# Patient Record
Sex: Male | Born: 1960 | Race: Black or African American | Hispanic: No | Marital: Married | Smoking: Never smoker
Health system: Southern US, Community
[De-identification: ages and names within clinical notes are randomized; demographics above are authoritative.]

## PROBLEM LIST (undated history)

## (undated) DIAGNOSIS — E785 Hyperlipidemia, unspecified: Secondary | ICD-10-CM

## (undated) DIAGNOSIS — K5900 Constipation, unspecified: Secondary | ICD-10-CM

## (undated) DIAGNOSIS — R739 Hyperglycemia, unspecified: Secondary | ICD-10-CM

## (undated) DIAGNOSIS — T7840XA Allergy, unspecified, initial encounter: Secondary | ICD-10-CM

## (undated) DIAGNOSIS — I499 Cardiac arrhythmia, unspecified: Secondary | ICD-10-CM

## (undated) DIAGNOSIS — M79604 Pain in right leg: Secondary | ICD-10-CM

## (undated) DIAGNOSIS — Z0000 Encounter for general adult medical examination without abnormal findings: Secondary | ICD-10-CM

## (undated) DIAGNOSIS — I10 Essential (primary) hypertension: Secondary | ICD-10-CM

## (undated) HISTORY — DX: Hyperglycemia, unspecified: R73.9

## (undated) HISTORY — DX: Allergy, unspecified, initial encounter: T78.40XA

## (undated) HISTORY — DX: Pain in right leg: M79.604

## (undated) HISTORY — DX: Constipation, unspecified: K59.00

## (undated) HISTORY — PX: VENTRAL HERNIA REPAIR: SHX424

## (undated) HISTORY — DX: Encounter for general adult medical examination without abnormal findings: Z00.00

---

## 2003-03-31 ENCOUNTER — Encounter (HOSPITAL_BASED_OUTPATIENT_CLINIC_OR_DEPARTMENT_OTHER): Payer: Self-pay | Admitting: General Surgery

## 2003-04-02 ENCOUNTER — Ambulatory Visit (HOSPITAL_COMMUNITY): Admission: RE | Admit: 2003-04-02 | Discharge: 2003-04-02 | Payer: Self-pay | Admitting: General Surgery

## 2003-04-02 ENCOUNTER — Encounter (INDEPENDENT_AMBULATORY_CARE_PROVIDER_SITE_OTHER): Payer: Self-pay | Admitting: Specialist

## 2006-05-05 ENCOUNTER — Emergency Department (HOSPITAL_COMMUNITY): Admission: EM | Admit: 2006-05-05 | Discharge: 2006-05-05 | Payer: Self-pay | Admitting: Emergency Medicine

## 2007-11-27 HISTORY — PX: KNEE ARTHROSCOPY: SHX127

## 2008-12-01 ENCOUNTER — Ambulatory Visit (HOSPITAL_BASED_OUTPATIENT_CLINIC_OR_DEPARTMENT_OTHER): Admission: RE | Admit: 2008-12-01 | Discharge: 2008-12-01 | Payer: Self-pay | Admitting: Orthopedic Surgery

## 2010-05-19 ENCOUNTER — Emergency Department (HOSPITAL_BASED_OUTPATIENT_CLINIC_OR_DEPARTMENT_OTHER): Admission: EM | Admit: 2010-05-19 | Discharge: 2010-05-20 | Payer: Self-pay | Admitting: Emergency Medicine

## 2011-03-12 LAB — POCT I-STAT 4, (NA,K, GLUC, HGB,HCT): Sodium: 142 mEq/L (ref 135–145)

## 2011-04-10 NOTE — Op Note (Signed)
NAME:  Tommy Farrell, Tommy Farrell           ACCOUNT NO.:  [id number]   MEDICAL RECORD NO.:  [id number]          PATIENT TYPE:  AMB   LOCATION:  [facility]                         FACILITY:  [facility]   PHYSICIAN:  Ollen Gross, M.D.    DATE OF BIRTH:  [birth year]   DATE OF PROCEDURE:  12/01/2008  DATE OF DISCHARGE:                               OPERATIVE REPORT   PREOPERATIVE DIAGNOSIS:  Right knee medial meniscal tear.   POSTOPERATIVE DIAGNOSES:  1. Right knee medial meniscal tear.  2. Chondral defect medial.   PROCEDURE:  Right knee arthroscopy with medial meniscal debridement and  chondroplasty.   SURGEON:  Dr. Lequita Halt.   ASSISTANT:  None.   ANESTHESIA:  General.   ESTIMATED BLOOD LOSS:  Minimal.   DRAIN:  None.   COMPLICATIONS:  None.   CONDITION:  Stable to recovery.   BRIEF CLINICAL NOTE:  [name] is a 50 year old male with a significant  history of right knee pain, recurrent effusions and mechanical symptoms.  Exam and history suggest a medial meniscal tear plus/minus chondral  defect.  The MRI confirmed a meniscal tear.  He presents now for  arthroscopy and debridement.   PROCEDURE IN DETAIL:  After the successful administration of general  anesthetic, a tourniquet was placed high on the right thigh and the  right lower extremity prepped and draped in the usual sterile fashion.  Standard superomedial and inferolateral incisions were made.  Inflow  cannula passed superomedial, camera passed inferolateral.  Arthroscopic  visualization proceeds.  The undersurface of the patella and the  trochlea looked normal.  Medial and lateral gutters were visualized,  there were no loose bodies.  Flexion and valgus force was applied to the  knee and the medial compartment was centered.  He had evidence of a  significant tear in the body and posterior horn of the medial meniscus.  There was also an area of cartilage coming off the medial femoral  condyle.  A spinal needle was used to localize  the inferomedial portal,  a small incision made and dilator placed.  I debrided the meniscus back  to a stable base with baskets and a 4.2 mL shaver and sealed it off with  the ArthroCare device.  The meniscus was found to be stable.  On the  surface of the femur there was a tiny area, about 0.5 x 0.5 cm where the  cartilage was coming off the bone.  I debrided this back to a stable  base.  The rest of the medial femoral condyle had some chondromalacia,  but no full-thickness defects.  There was also some chondromalacia on  the tibial plateau, but again no full-thickness defects.  The  intercondylar notch was visualized.  There was no evidence of any ACL  deficiency.  The lateral compartment was entered and it looked normal.  I went back up into the suprapatellar area and there were some loose  bodies.  I removed these with the suction.  There was some hypertrophic  synovitis which I debrided with the ArthroCare device.  The joints were  again inspected.  There was no other abnormalities noted.  The  arthroscopic equipment was then removed from the inferior portals which  were closed with interrupted 4-0 nylon.  Twenty mL of quarter-percent  Marcaine with epinephrine injected through the inflow cannula, then that  was removed and that portal closed with nylon.  A bulky sterile dressing  was applied and he was awakened and transported to recovery in stable  condition.      Ollen Gross, M.D.  Electronically Signed     FA/MEDQ  D:  12/01/2008  T:  12/01/2008  Job:  [id number]

## 2011-04-13 NOTE — Op Note (Signed)
   NAME:  Tommy Farrell, Tommy Farrell NO.:  [id number]   MEDICAL RECORD NO.:  [id number]                   PATIENT TYPE:  OIB   LOCATION:  2899                                 FACILITY:  [facility]   PHYSICIAN:  Leonie Man, M.D.                DATE OF BIRTH:  1961/05/25   DATE OF PROCEDURE:  04/02/2003  DATE OF DISCHARGE:                                 OPERATIVE REPORT   PREOPERATIVE DIAGNOSIS:  Incarcerated ventral hernia.   POSTOPERATIVE DIAGNOSIS:  Incarcerated ventral hernia.   PROCEDURE:  Repair, incarcerated ventral hernia, with mesh.   SURGEON:  Leonie Man, M.D.   ASSISTANT:  Nurse.   ANESTHESIA:  General.   CLINICAL NOTE:  This patient is a 50 year old man presenting with enlarging  mass above the umbilicus, which has been causing some increasing discomfort.  He comes now for repair after the risks and potential benefits of surgery  have been fully discussed, all questions answered, and consents obtained.   DESCRIPTION OF PROCEDURE:  Following the induction of satisfactory general  anesthesia, the patient is positioned supinely, the abdomen prepped and  draped to be included in the sterile operative field.  A midline vertical  incision carried down over the mass, deepened through the signs and  symptoms, dissection carried down into the mass.  The mass is dissected  free, carrying the dissection down to the fascia.  The fascial defect is  then reached and felt.  The intra-abdominal contents within the mass were  primarily omentum and round ligament.  These were dissected and transected  between clamps and secured with ties of 2-0 silk.  The hernial defect  was  repaired with a small mesh plug placed into the defect and sewn in place  with interrupted 0 Novofil sutures.  At the end of this, the repair was  noted to be intact.  Sponge and instrument counts were verified.  The  subcutaneous tissue was closed with a running 2-0 Vicryl suture, skin  closed  with running 4-0 Monocryl suture.  The wound was then reinforced with Steri-  Strips, sterile dressings applied.  Anesthetic reversed, the patient removed  from the operating room to the recovery room in stable condition.  He  tolerated the procedure well.                                               Leonie Man, M.D.    PB/MEDQ  D:  04/02/2003  T:  04/05/2003  Job:  [id number]

## 2011-08-24 LAB — HM COLONOSCOPY

## 2011-11-27 LAB — HM COLONOSCOPY: HM Colonoscopy: 2

## 2011-11-30 ENCOUNTER — Other Ambulatory Visit: Payer: Self-pay | Admitting: Orthopedic Surgery

## 2012-01-10 ENCOUNTER — Encounter (HOSPITAL_BASED_OUTPATIENT_CLINIC_OR_DEPARTMENT_OTHER): Payer: Self-pay | Admitting: *Deleted

## 2012-01-10 NOTE — Progress Notes (Signed)
To [facility] at 0800. Istat,Ekg on arrival.NPO after mn.Will call back  with name of BP medication .

## 2012-01-15 NOTE — H&P (Signed)
  CC- Montoya Brandel is a 51 y.o. male who presents with left knee pain.  HPI- . Knee Pain: Patient presents with knee pain involving the  left knee. Onset of the symptoms was several months ago. Inciting event: injured while at work. Current symptoms include giving out, pain located medially, stiffness and swelling. Pain is aggravated by going up and down stairs, squatting and walking.  Patient has had no prior knee problems. Evaluation to date: MRI: abnormal medial meniscal tear. Treatment to date: OTC analgesics which are not very effective.  Past Medical History  Diagnosis Date  . Hypertension   . Hyperlipemia   . Dysrhythmia     sinus arrythmia-2006-work up negative findings    Past Surgical History  Procedure Date  . Ventral hernia repair   . Knee arthroscopy 2009    rt knee    Prior to Admission medications   Medication Sig Start Date End Date Taking? Authorizing Provider  aspirin 81 MG tablet Take 81 mg by mouth daily.   Yes Historical Provider, MD  fish oil-omega-3 fatty acids 1000 MG capsule Take 2 g by mouth daily.   Yes Historical Provider, MD  Garlic 10 MG CAPS Take by mouth.   Yes Historical Provider, MD  hydrochlorothiazide (HYDRODIURIL) 25 MG tablet Take 25 mg by mouth daily.   Yes Historical Provider, MD  Multiple Vitamin (MULTIVITAMIN) tablet Take 1 tablet by mouth daily.   Yes Historical Provider, MD   KNEE EXAM antalgic gait, soft tissue tenderness over medial joint line, effusion, reduced range of motion, negative drawer sign, collateral ligaments intact, normal ipsilateral hip exam  Physical Examination: General appearance - alert, well appearing, and in no distress Mental status - alert, oriented to person, place, and time Chest - clear to auscultation, no wheezes, rales or rhonchi, symmetric air entry Heart - normal rate, regular rhythm, normal S1, S2, no murmurs, rubs, clicks or gallops Abdomen - soft, nontender, nondistended, no masses or  organomegaly Neurological - alert, oriented, normal speech, no focal findings or movement disorder noted   Asessment/Plan--- Left knee medial meniscal tear- - Plan left knee arthroscopy with meniscal debridement. Procedure risks and potential comps discussed with patient who elects to proceed. Goals are decreased pain and increased function with a high likelihood of achieving both

## 2012-01-16 ENCOUNTER — Encounter (HOSPITAL_BASED_OUTPATIENT_CLINIC_OR_DEPARTMENT_OTHER): Payer: Self-pay | Admitting: *Deleted

## 2012-01-16 ENCOUNTER — Encounter (HOSPITAL_BASED_OUTPATIENT_CLINIC_OR_DEPARTMENT_OTHER)
Admission: RE | Disposition: A | Discharge status: Home / Self Care | Payer: Self-pay | Source: Ambulatory Visit | Attending: Orthopedic Surgery

## 2012-01-16 ENCOUNTER — Encounter (HOSPITAL_BASED_OUTPATIENT_CLINIC_OR_DEPARTMENT_OTHER): Payer: Self-pay | Admitting: Anesthesiology

## 2012-01-16 ENCOUNTER — Ambulatory Visit (HOSPITAL_BASED_OUTPATIENT_CLINIC_OR_DEPARTMENT_OTHER): Payer: BC Managed Care – PPO | Admitting: Anesthesiology

## 2012-01-16 ENCOUNTER — Ambulatory Visit (HOSPITAL_BASED_OUTPATIENT_CLINIC_OR_DEPARTMENT_OTHER)
Admission: RE | Admit: 2012-01-16 | Discharge: 2012-01-16 | Disposition: A | Discharge status: Home / Self Care | Payer: BC Managed Care – PPO | Source: Ambulatory Visit | Attending: Orthopedic Surgery | Admitting: Orthopedic Surgery

## 2012-01-16 ENCOUNTER — Other Ambulatory Visit: Payer: Self-pay

## 2012-01-16 DIAGNOSIS — M25569 Pain in unspecified knee: Secondary | ICD-10-CM | POA: Insufficient documentation

## 2012-01-16 DIAGNOSIS — X58XXXA Exposure to other specified factors, initial encounter: Secondary | ICD-10-CM | POA: Insufficient documentation

## 2012-01-16 DIAGNOSIS — I10 Essential (primary) hypertension: Secondary | ICD-10-CM | POA: Insufficient documentation

## 2012-01-16 DIAGNOSIS — Z79899 Other long term (current) drug therapy: Secondary | ICD-10-CM | POA: Insufficient documentation

## 2012-01-16 DIAGNOSIS — Z7982 Long term (current) use of aspirin: Secondary | ICD-10-CM | POA: Insufficient documentation

## 2012-01-16 DIAGNOSIS — R269 Unspecified abnormalities of gait and mobility: Secondary | ICD-10-CM | POA: Insufficient documentation

## 2012-01-16 DIAGNOSIS — IMO0002 Reserved for concepts with insufficient information to code with codable children: Secondary | ICD-10-CM | POA: Insufficient documentation

## 2012-01-16 DIAGNOSIS — E785 Hyperlipidemia, unspecified: Secondary | ICD-10-CM | POA: Insufficient documentation

## 2012-01-16 DIAGNOSIS — M2240 Chondromalacia patellae, unspecified knee: Secondary | ICD-10-CM | POA: Insufficient documentation

## 2012-01-16 DIAGNOSIS — S83249A Other tear of medial meniscus, current injury, unspecified knee, initial encounter: Secondary | ICD-10-CM

## 2012-01-16 HISTORY — PX: KNEE ARTHROSCOPY: SHX127

## 2012-01-16 HISTORY — DX: Cardiac arrhythmia, unspecified: I49.9

## 2012-01-16 HISTORY — DX: Hyperlipidemia, unspecified: E78.5

## 2012-01-16 HISTORY — DX: Essential (primary) hypertension: I10

## 2012-01-16 SURGERY — ARTHROSCOPY, KNEE
Anesthesia: General | Site: Knee | Laterality: Left | Wound class: Clean

## 2012-01-16 MED ORDER — KETOROLAC TROMETHAMINE 30 MG/ML IJ SOLN
INTRAMUSCULAR | Status: DC | PRN
  Administered 2012-01-16: 30 mg via INTRAVENOUS

## 2012-01-16 MED ORDER — PROMETHAZINE HCL 25 MG/ML IJ SOLN: 6.2500 mg | INTRAMUSCULAR | Status: DC | PRN

## 2012-01-16 MED ORDER — BUPIVACAINE-EPINEPHRINE 0.25% -1:200000 IJ SOLN
INTRAMUSCULAR | Status: DC | PRN
  Administered 2012-01-16: 20 mL

## 2012-01-16 MED ORDER — MIDAZOLAM HCL 5 MG/5ML IJ SOLN
INTRAMUSCULAR | Status: DC | PRN
  Administered 2012-01-16: 2 mg via INTRAVENOUS

## 2012-01-16 MED ORDER — DEXAMETHASONE SODIUM PHOSPHATE 4 MG/ML IJ SOLN
INTRAMUSCULAR | Status: DC | PRN
  Administered 2012-01-16: 8 mg via INTRAVENOUS

## 2012-01-16 MED ORDER — SODIUM CHLORIDE 0.9 % IR SOLN
Status: DC | PRN
  Administered 2012-01-16: 6000

## 2012-01-16 MED ORDER — OXYCODONE-ACETAMINOPHEN 5-325 MG PO TABS
1.0000 | ORAL_TABLET | ORAL | Status: DC | PRN
  Administered 2012-01-16: 1 via ORAL

## 2012-01-16 MED ORDER — ONDANSETRON HCL 4 MG/2ML IJ SOLN
INTRAMUSCULAR | Status: DC | PRN
  Administered 2012-01-16: 4 mg via INTRAVENOUS

## 2012-01-16 MED ORDER — LACTATED RINGERS IV SOLN: INTRAVENOUS | Status: DC

## 2012-01-16 MED ORDER — FENTANYL CITRATE 0.05 MG/ML IJ SOLN
INTRAMUSCULAR | Status: DC | PRN
  Administered 2012-01-16 (×2): 50 ug via INTRAVENOUS

## 2012-01-16 MED ORDER — LIDOCAINE HCL (CARDIAC) 20 MG/ML IV SOLN
INTRAVENOUS | Status: DC | PRN
  Administered 2012-01-16: 100 mg via INTRAVENOUS

## 2012-01-16 MED ORDER — FENTANYL CITRATE 0.05 MG/ML IJ SOLN
25.0000 ug | INTRAMUSCULAR | Status: DC | PRN
  Administered 2012-01-16 (×3): 25 ug via INTRAVENOUS

## 2012-01-16 MED ORDER — LACTATED RINGERS IV SOLN
INTRAVENOUS | Status: DC
  Administered 2012-01-16: 09:00:00 via INTRAVENOUS

## 2012-01-16 MED ORDER — PROPOFOL 10 MG/ML IV EMUL
INTRAVENOUS | Status: DC | PRN
  Administered 2012-01-16: 200 mg via INTRAVENOUS

## 2012-01-16 MED ORDER — METHOCARBAMOL 500 MG PO TABS: 500.0000 mg | ORAL_TABLET | Freq: Four times a day (QID) | ORAL | Status: AC

## 2012-01-16 MED ORDER — CEFAZOLIN SODIUM-DEXTROSE 2-3 GM-% IV SOLR
2.0000 g | Freq: Once | INTRAVENOUS | Status: AC
  Administered 2012-01-16: 2 g via INTRAVENOUS

## 2012-01-16 MED ORDER — OXYCODONE HCL 5 MG PO TABS: 5.0000 mg | ORAL_TABLET | ORAL | Status: AC | PRN

## 2012-01-16 SURGICAL SUPPLY — 35 items
BANDAGE ELASTIC 6 VELCRO ST LF (GAUZE/BANDAGES/DRESSINGS) ×2 IMPLANT
BLADE 4.2CUDA (BLADE) ×2 IMPLANT
BLADE CUDA SHAVER 3.5 (BLADE) IMPLANT
BLADE CUTTER GATOR 3.5 (BLADE) IMPLANT
CANISTER SUCT LVC 12 LTR MEDI- (MISCELLANEOUS) ×2 IMPLANT
CANISTER SUCTION 2500CC (MISCELLANEOUS) IMPLANT
CLOTH BEACON ORANGE TIMEOUT ST (SAFETY) ×2 IMPLANT
DRAPE ARTHROSCOPY W/POUCH 114 (DRAPES) ×2 IMPLANT
DRSG EMULSION OIL 3X3 NADH (GAUZE/BANDAGES/DRESSINGS) ×2 IMPLANT
DURAPREP 26ML APPLICATOR (WOUND CARE) ×2 IMPLANT
ELECT MENISCUS 165MM 90D (ELECTRODE) IMPLANT
ELECT REM PT RETURN 9FT ADLT (ELECTROSURGICAL)
ELECTRODE REM PT RTRN 9FT ADLT (ELECTROSURGICAL) IMPLANT
GLOVE BIO SURGEON STRL SZ 6.5 (GLOVE) ×1 IMPLANT
GLOVE BIO SURGEON STRL SZ8 (GLOVE) ×2 IMPLANT
GLOVE BIOGEL PI IND STRL 7.0 (GLOVE) IMPLANT
GLOVE BIOGEL PI INDICATOR 7.0 (GLOVE) ×1
GLOVE INDICATOR 8.0 STRL GRN (GLOVE) ×2 IMPLANT
GOWN PREVENTION PLUS LG XLONG (DISPOSABLE) ×4 IMPLANT
IV NS IRRIG 3000ML ARTHROMATIC (IV SOLUTION) ×2 IMPLANT
KNEE WRAP E Z 3 GEL PACK (MISCELLANEOUS) ×2 IMPLANT
PACK ARTHROSCOPY DSU (CUSTOM PROCEDURE TRAY) ×2 IMPLANT
PACK BASIN DAY SURGERY FS (CUSTOM PROCEDURE TRAY) ×2 IMPLANT
PADDING CAST ABS 4INX4YD NS (CAST SUPPLIES) ×1
PADDING CAST ABS COTTON 4X4 ST (CAST SUPPLIES) ×1 IMPLANT
PADDING CAST COTTON 6X4 STRL (CAST SUPPLIES) ×2 IMPLANT
PENCIL BUTTON HOLSTER BLD 10FT (ELECTRODE) IMPLANT
SET ARTHROSCOPY TUBING (MISCELLANEOUS) ×2
SET ARTHROSCOPY TUBING LN (MISCELLANEOUS) ×1 IMPLANT
SPONGE GAUZE 4X4 12PLY (GAUZE/BANDAGES/DRESSINGS) ×2 IMPLANT
SUT ETHILON 4 0 PS 2 18 (SUTURE) ×2 IMPLANT
TOWEL OR 17X24 6PK STRL BLUE (TOWEL DISPOSABLE) ×2 IMPLANT
WAND 30 DEG SABER W/CORD (SURGICAL WAND) IMPLANT
WAND 90 DEG TURBOVAC W/CORD (SURGICAL WAND) ×1 IMPLANT
WATER STERILE IRR 500ML POUR (IV SOLUTION) ×2 IMPLANT

## 2012-01-16 NOTE — Discharge Instructions (Addendum)
Arthroscopic Procedure, Knee An arthroscopic procedure can find what is wrong with your knee. PROCEDURE Arthroscopy is a surgical technique that allows your orthopedic surgeon to diagnose and treat your knee injury with accuracy. They will look into your knee through a small instrument. This is almost like a small (pencil sized) telescope. Because arthroscopy affects your knee less than open knee surgery, you can anticipate a more rapid recovery. Taking an active role by following your caregiver's instructions will help with rapid and complete recovery. Use crutches, rest, elevation, ice, and knee exercises as instructed. The length of recovery depends on various factors including type of injury, age, physical condition, medical conditions, and your rehabilitation. Your knee is the joint between the large bones (femur and tibia) in your leg. Cartilage covers these bone ends which are smooth and slippery and allow your knee to bend and move smoothly. Two menisci, thick, semi-lunar shaped pads of cartilage which form a rim inside the joint, help absorb shock and stabilize your knee. Ligaments bind the bones together and support your knee joint. Muscles move the joint, help support your knee, and take stress off the joint itself. Because of this all programs and physical therapy to rehabilitate an injured or repaired knee require rebuilding and strengthening your muscles. AFTER THE PROCEDURE  After the procedure, you will be moved to a recovery area until most of the effects of the medication have worn off. Your caregiver will discuss the test results with you.   Only take over-the-counter or prescription medicines for pain, discomfort, or fever as directed by your caregiver.  SEEK MEDICAL CARE IF:   You have increased bleeding from your wounds.   You see redness, swelling, or have increasing pain in your wounds.   You have pus coming from your wound.   You have an oral temperature above 102 F (38.9  C).   You notice a bad smell coming from the wound or dressing.   You have severe pain with any motion of your knee.  SEEK IMMEDIATE MEDICAL CARE IF:   You develop a rash.   You have difficulty breathing.   You have any allergic problems.  Document Released: 11/09/2000 Document Revised: 07/25/2011 Document Reviewed: 06/02/2008 [facility] Patient Information 2012 [place], [place].Arthroscopic Procedure, Knee An arthroscopic procedure can find what is wrong with your knee. PROCEDURE Arthroscopy is a surgical technique that allows your orthopedic surgeon to diagnose and treat your knee injury with accuracy. They will look into your knee through a small instrument. This is almost like a small (pencil sized) telescope. Because arthroscopy affects your knee less than open knee surgery, you can anticipate a more rapid recovery. Taking an active role by following your caregiver's instructions will help with rapid and complete recovery. Use crutches, rest, elevation, ice, and knee exercises as instructed. The length of recovery depends on various factors including type of injury, age, physical condition, medical conditions, and your rehabilitation. Your knee is the joint between the large bones (femur and tibia) in your leg. Cartilage covers these bone ends which are smooth and slippery and allow your knee to bend and move smoothly. Two menisci, thick, semi-lunar shaped pads of cartilage which form a rim inside the joint, help absorb shock and stabilize your knee. Ligaments bind the bones together and support your knee joint. Muscles move the joint, help support your knee, and take stress off the joint itself. Because of this all programs and physical therapy to rehabilitate an injured or repaired knee require rebuilding and  strengthening your muscles. AFTER THE PROCEDURE  After the procedure, you will be moved to a recovery area until most of the effects of the medication have worn off. Your caregiver will  discuss the test results with you.   Only take over-the-counter or prescription medicines for pain, discomfort, or fever as directed by your caregiver.  SEEK MEDICAL CARE IF:   You have increased bleeding from your wounds.   You see redness, swelling, or have increasing pain in your wounds.   You have pus coming from your wound.   You have an oral temperature above 102 F (38.9 C).   You notice a bad smell coming from the wound or dressing.   You have severe pain with any motion of your knee.  SEEK IMMEDIATE MEDICAL CARE IF:   You develop a rash.   You have difficulty breathing.   You have any allergic problems.  Document Released: 11/09/2000 Document Revised: 07/25/2011 Document Reviewed: 06/02/2008 [facility] Patient Information 2012 [place], [place].

## 2012-01-16 NOTE — Anesthesia Procedure Notes (Signed)
Procedure Name: LMA Insertion Performed by: Myking Sar Pre-anesthesia Checklist: Patient identified, Emergency Drugs available, Suction available and Patient being monitored Patient Re-evaluated:Patient Re-evaluated prior to inductionOxygen Delivery Method: Circle System Utilized Preoxygenation: Pre-oxygenation with 100% oxygen Intubation Type: IV induction Ventilation: Mask ventilation without difficulty LMA: LMA inserted LMA Size: 4.0 Number of attempts: 1 Airway Equipment and Method: bite block Placement Confirmation: positive ETCO2 Dental Injury: Teeth and Oropharynx as per pre-operative assessment      

## 2012-01-16 NOTE — Progress Notes (Signed)
Scripts sent to wife by D. Marvetta Gibbons, RN

## 2012-01-16 NOTE — Interval H&P Note (Signed)
History and Physical Interval Note:  01/16/2012 10:07 AM  Tommy Farrell  has presented today for surgery, with the diagnosis of LEFT KNEE MEDIAL MENISCAL TEAR  The various methods of treatment have been discussed with the patient and family. After consideration of risks, benefits and other options for treatment, the patient has consented to  Procedure(s) (LRB): ARTHROSCOPY KNEE (Left) as a surgical intervention .  The patients' history has been reviewed, patient examined, no change in status, stable for surgery.  I have reviewed the patients' chart and labs.  Questions were answered to the patient's satisfaction.     Loanne Drilling

## 2012-01-16 NOTE — Anesthesia Preprocedure Evaluation (Addendum)
Anesthesia Evaluation  Patient identified by MRN, date of birth, ID band Patient awake    Reviewed: Allergy & Precautions, H&P , NPO status , Patient's Chart, lab work & pertinent test results  Airway Mallampati: II TM Distance: >3 FB Neck ROM: full    Dental No notable dental hx. (+) Teeth Intact and Dental Advisory Given   Pulmonary neg pulmonary ROS,  clear to auscultation  Pulmonary exam normal       Cardiovascular Exercise Tolerance: Good hypertension, On Medications neg cardio ROS + dysrhythmias regular Normal    Neuro/Psych Negative Neurological ROS  Negative Psych ROS   GI/Hepatic negative GI ROS, Neg liver ROS,   Endo/Other  Negative Endocrine ROS  Renal/GU negative Renal ROS  Genitourinary negative   Musculoskeletal   Abdominal   Peds  Hematology negative hematology ROS (+)   Anesthesia Other Findings   Reproductive/Obstetrics negative OB ROS                          Anesthesia Physical Anesthesia Plan  ASA: II  Anesthesia Plan: General   Post-op Pain Management:    Induction: Intravenous  Airway Management Planned: LMA  Additional Equipment:   Intra-op Plan:   Post-operative Plan:   Informed Consent: I have reviewed the patients History and Physical, chart, labs and discussed the procedure including the risks, benefits and alternatives for the proposed anesthesia with the patient or authorized representative who has indicated his/her understanding and acceptance.   Dental Advisory Given  Plan Discussed with: CRNA and Surgeon  Anesthesia Plan Comments:         Anesthesia Quick Evaluation

## 2012-01-16 NOTE — Transfer of Care (Signed)
Immediate Anesthesia Transfer of Care Note  Patient: Tommy Farrell  Procedure(s) Performed: Procedure(s) (LRB): ARTHROSCOPY KNEE (Left)  Patient Location: PACU  Anesthesia Type: General  Level of Consciousness: awake  Airway & Oxygen Therapy: Patient Spontanous Breathing and Patient connected to nasal cannula oxygen  Post-op Assessment: Report given to PACU RN  Post vital signs: Reviewed and stable  Complications: No apparent anesthesia complications

## 2012-01-16 NOTE — Anesthesia Postprocedure Evaluation (Signed)
  Anesthesia Post-op Note  Patient: Tommy Farrell  Procedure(s) Performed: Procedure(s) (LRB): ARTHROSCOPY KNEE (Left)  Patient Location: PACU  Anesthesia Type: General  Level of Consciousness: awake and alert   Airway and Oxygen Therapy: Patient Spontanous Breathing  Post-op Pain: mild  Post-op Assessment: Post-op Vital signs reviewed, Patient's Cardiovascular Status Stable, Respiratory Function Stable, Patent Airway and No signs of Nausea or vomiting  Post-op Vital Signs: stable  Complications: No apparent anesthesia complications

## 2012-01-16 NOTE — Op Note (Signed)
Preoperative diagnosis-  Left knee medial meniscal tear  Postoperative diagnosis Left- knee medial meniscal tear   Plus Left medial femoral chondral defect  Procedure- Left knee arthroscopy with medial  Meniscal debridement and chondroplasty   Surgeon- Gus Rankin. Evin Loiseau, MD  Anesthesia-General  EBL-  Minimal  Complications- None  Condition- PACU - hemodynamically stable.  Brief clinical note- -Tommy Farrell is a 51 y.o.  male with a several month history of left knee pain and mechanical symptoms. Exam and history suggested medial meniscal tear confirmed by MRI. The patient presents now for arthroscopy and debridement  Procedure in detail -       After successful administration of General anesthetic, a tourmiquet is placed high on the Left  thigh and the Left lower extremity is prepped and draped in the usual sterile fashion. Time out is performed by the surgical team. Standard superomedial and inferolateral portal sites are marked and incisions made with an 11 blade. The inflow cannula is passed through the superomedial portal and camera through the inferolateral portal and inflow is initiated. Arthroscopic visualization proceeds.      The undersurface of the patella and trochlea are visualized and appear normal. The medial and lateral gutters are visualized and there are  no loose bodies. Flexion and valgus force is applied to the knee and the medial compartment is entered. A spinal needle is passed into the joint through the site marked for the inferomedial portal. A small incision is made and the dilator passed into the joint. The findings for the medial compartment are tear of body and posterior horn of the medial meniscus with a fragment flipped into the intercondylar notch as well as a small chondral defect on the medial femoral condyle . The tear is debrided to a stable base with baskets and a shaver and sealed off with the Arthrocare. The shaver is used to debride the unstable  cartilage to a stable cartilaginous base with stable edges. It is probed and found to be stable.    The intercondylar notch is visualized and the ACL appears normal . The lateral compartment is entered and the findings are normal .      The joint is again inspected and there are no other tears, defects or loose bodies identified. The arthroscopic equipment is then removed from the inferior portals which are closed with interrupted 4-0 nylon. 20 ml of .25% Marcaine with epinephrine are injected through the inflow cannula and the cannula is then removed and the portal closed with nylon. The incisions are cleaned and dried and a bulky sterile dressing is applied. The patient is then awakened and transported to recovery in stable condition.   01/16/2012, 10:45 AM

## 2012-01-17 ENCOUNTER — Encounter (HOSPITAL_BASED_OUTPATIENT_CLINIC_OR_DEPARTMENT_OTHER): Payer: Self-pay | Admitting: Orthopedic Surgery

## 2012-01-22 ENCOUNTER — Encounter (HOSPITAL_BASED_OUTPATIENT_CLINIC_OR_DEPARTMENT_OTHER): Payer: Self-pay

## 2013-11-16 DIAGNOSIS — R7303 Prediabetes: Secondary | ICD-10-CM | POA: Insufficient documentation

## 2014-11-07 ENCOUNTER — Emergency Department (HOSPITAL_BASED_OUTPATIENT_CLINIC_OR_DEPARTMENT_OTHER)
Admission: EM | Admit: 2014-11-07 | Discharge: 2014-11-07 | Disposition: A | Discharge status: Home / Self Care | Payer: BC Managed Care – PPO | Attending: Emergency Medicine | Admitting: Emergency Medicine

## 2014-11-07 ENCOUNTER — Encounter (HOSPITAL_BASED_OUTPATIENT_CLINIC_OR_DEPARTMENT_OTHER): Payer: Self-pay

## 2014-11-07 DIAGNOSIS — S86911A Strain of unspecified muscle(s) and tendon(s) at lower leg level, right leg, initial encounter: Secondary | ICD-10-CM | POA: Diagnosis not present

## 2014-11-07 DIAGNOSIS — Y9289 Other specified places as the place of occurrence of the external cause: Secondary | ICD-10-CM | POA: Diagnosis not present

## 2014-11-07 DIAGNOSIS — Z7982 Long term (current) use of aspirin: Secondary | ICD-10-CM | POA: Diagnosis not present

## 2014-11-07 DIAGNOSIS — Y990 Civilian activity done for income or pay: Secondary | ICD-10-CM | POA: Diagnosis not present

## 2014-11-07 DIAGNOSIS — T148XXA Other injury of unspecified body region, initial encounter: Secondary | ICD-10-CM

## 2014-11-07 DIAGNOSIS — S7011XA Contusion of right thigh, initial encounter: Secondary | ICD-10-CM | POA: Diagnosis not present

## 2014-11-07 DIAGNOSIS — Y9389 Activity, other specified: Secondary | ICD-10-CM | POA: Diagnosis not present

## 2014-11-07 DIAGNOSIS — X58XXXA Exposure to other specified factors, initial encounter: Secondary | ICD-10-CM | POA: Diagnosis not present

## 2014-11-07 DIAGNOSIS — I10 Essential (primary) hypertension: Secondary | ICD-10-CM | POA: Insufficient documentation

## 2014-11-07 DIAGNOSIS — Z8639 Personal history of other endocrine, nutritional and metabolic disease: Secondary | ICD-10-CM | POA: Insufficient documentation

## 2014-11-07 DIAGNOSIS — Z79899 Other long term (current) drug therapy: Secondary | ICD-10-CM | POA: Insufficient documentation

## 2014-11-07 DIAGNOSIS — S8991XA Unspecified injury of right lower leg, initial encounter: Secondary | ICD-10-CM | POA: Diagnosis present

## 2014-11-07 MED ORDER — HYDROCODONE-ACETAMINOPHEN 5-325 MG PO TABS: 1.0000 | ORAL_TABLET | ORAL | Status: DC | PRN

## 2014-11-07 MED ORDER — METHOCARBAMOL 500 MG PO TABS: 500.0000 mg | ORAL_TABLET | Freq: Two times a day (BID) | ORAL | Status: DC

## 2014-11-07 MED ORDER — NAPROXEN 500 MG PO TABS: 500.0000 mg | ORAL_TABLET | Freq: Two times a day (BID) | ORAL | Status: DC

## 2014-11-07 NOTE — ED Provider Notes (Signed)
CSN: [id number]     Arrival date & time 11/07/14  0905 History   First MD Initiated Contact with Patient 11/07/14 0919     Chief Complaint  Patient presents with  . Leg Pain      HPI  Patient presents with right leg muscle pain and skin discoloration. He was at work on Wednesday, 4 days ago. He was standing in the back of a truck. He states his right foot was planted he was leaning to his left pushing a box. He felt a pain and pole and burning in his "hamstring". He states he was sore and felt like it was tight" like muscle pull". However yesterday morning he noticed there is some discoloration of the skin and his medial distal thigh. He became concerned. More discoloration. This morning he presents here. He walks with a minimal limp and states his pain is improving.  Past Medical History  Diagnosis Date  . Hypertension   . Hyperlipemia   . Dysrhythmia     sinus arrythmia-2006-work up negative findings   Past Surgical History  Procedure Laterality Date  . Ventral hernia repair    . Knee arthroscopy  2009    rt knee  . Knee arthroscopy  01/16/2012    Procedure: ARTHROSCOPY KNEE;  Surgeon: Gearlean Alf, MD;  Location: [facility];  Service: Orthopedics;  Laterality: Left;  WITH DEBRIDEMENT   No family history on file. History  Substance Use Topics  . Smoking status: Never Smoker   . Smokeless tobacco: Not on file  . Alcohol Use: No    Review of Systems  Musculoskeletal: Positive for myalgias and gait problem.  Skin: Positive for color change.  Hematological: Bruises/bleeds easily.      Allergies  Review of patient's allergies indicates no known allergies.  Home Medications   Prior to Admission medications   Medication Sig Start Date End Date Taking? Authorizing Provider  aspirin 81 MG tablet Take 81 mg by mouth daily.    Historical Provider, MD  fish oil-omega-3 fatty acids 1000 MG capsule Take 2 g by mouth daily.    Historical Provider, MD  Garlic  10 MG CAPS Take by mouth.    Historical Provider, MD  hydrochlorothiazide (HYDRODIURIL) 25 MG tablet Take 25 mg by mouth daily.    Historical Provider, MD  HYDROcodone-acetaminophen (NORCO/VICODIN) 5-325 MG per tablet Take 1 tablet by mouth every 4 (four) hours as needed. 11/07/14   Tanna Furry, MD  methocarbamol (ROBAXIN) 500 MG tablet Take 1 tablet (500 mg total) by mouth 2 (two) times daily. 11/07/14   Tanna Furry, MD  Multiple Vitamin (MULTIVITAMIN) tablet Take 1 tablet by mouth daily.    Historical Provider, MD  naproxen (NAPROSYN) 500 MG tablet Take 1 tablet (500 mg total) by mouth 2 (two) times daily. 11/07/14   Tanna Furry, MD   BP 149/95 mmHg  Pulse 68  Temp(Src) 98.4 F (36.9 C)  Resp 18  Ht 5\' 6"  (1.676 m)  Wt 165 lb (74.844 kg)  BMI 26.64 kg/m2  SpO2 98% Physical Exam  Musculoskeletal:       Legs: He has no pain to groin and adduction. He is summer he appears stable pain to hip extension and knee flexion. All referring to the area of discoloration consistent with mid to distal hamstring strain and partial tear.    ED Course  Procedures (including critical care time) Labs Review Labs Reviewed - No data to display  Imaging Review No results found.  EKG Interpretation None      MDM   Final diagnoses:  Muscle strain of right lower extremity, initial encounter  Hematoma    Exam and history and findings consistent with a medial hamstring strain and partial tear. His hamstring mechanism is intact he is able to fully flex extend the hip and leg. Hematoma on exam. No sign of compartment syndrome. Not tense to the muscle. Plan is expectant management. Warm soaks and gentle massage. Pain medication and anti-inflammatory muscle relaxant. Activity as tolerated.    Tanna Furry, MD 11/07/14 1003

## 2014-11-07 NOTE — ED Notes (Signed)
Patient here with right upper leg pain and discoloration. Reports Wednesday night lifted something off truck at work and felt a pull, now discoloration to same.

## 2014-11-07 NOTE — Discharge Instructions (Signed)
The discoloration is due to blood under the skin from the muscle tear.  This will resolve on its own slowly. Warm soaks and gentle massage can help the blood away more quickly. Gentle stretching as your pain improves.   Muscle Strain A muscle strain (pulled muscle) happens when a muscle is stretched beyond normal length. It happens when a sudden, violent force stretches your muscle too far. Usually, a few of the fibers in your muscle are torn. Muscle strain is common in athletes. Recovery usually takes 1-2 weeks. Complete healing takes 5-6 weeks.  HOME CARE   Follow the PRICE method of treatment to help your injury get better. Do this the first 2-3 days after the injury:  Protect. Protect the muscle to keep it from getting injured again.  Rest. Limit your activity and rest the injured body part.  Ice. Put ice in a plastic bag. Place a towel between your skin and the bag. Then, apply the ice and leave it on from 15-20 minutes each hour. After the third day, switch to moist heat packs.  Compression. Use a splint or elastic bandage on the injured area for comfort. Do not put it on too tightly.  Elevate. Keep the injured body part above the level of your heart.  Only take medicine as told by your doctor.  Warm up before doing exercise to prevent future muscle strains. GET HELP IF:   You have more pain or puffiness (swelling) in the injured area.  You feel numbness, tingling, or notice a loss of strength in the injured area. MAKE SURE YOU:   Understand these instructions.  Will watch your condition.  Will get help right away if you are not doing well or get worse. Document Released: 08/21/2008 Document Revised: 09/02/2013 Document Reviewed: 06/11/2013 [facility] Patient Information 2015 [place], [place]. This information is not intended to replace advice given to you by your health care provider. Make sure you discuss any questions you have with your health care provider.  Muscle  Tear A muscle tear is usually caused by over-exertion or stretching. The muscle often takes a while to heal. Muscle tears require 3 to 4 weeks to heal completely. A history of the injury and a physical exam may be performed. Sometimes, the injury is identified with radiographs and an MRI. Treatment for muscle injuries includes:  Resting and protecting the affected area until pain with motion is gone.  Putting ice on the injured area.  Put ice in a bag.  Place a towel between your skin and the bag.  Leave the ice on for 15 to 20 minutes, 3 to 4 times a day.  After two days, you can use heat to relieve spasms.  Using compression wraps to help control swelling and limit movement.  Raising (elevate) the injured area above the level of the heart (if possible) for the first 1 to 2 days after the injury.  Medicine may be prescribed to reduce pain and inflammation. Avoid strenuous activities that bring on muscle pain. Exercises to strengthen and stretch the injured muscle can help heal the muscle and prevent repeated injury. After the pain and swelling are gone, you may begin gradual strengthening exercises. Begin range-of-motion exercises and gentle stretching after 3 to 4 days of rest.  SEEK MEDICAL CARE IF:  Your injured muscle is not improving after 1 week of treatment. Document Released: 12/20/2004 Document Revised: 02/04/2012 Document Reviewed: 05/27/2009 [facility] Patient Information 2015 [place], [place]. This information is not intended to replace advice given  to you by your health care provider. Make sure you discuss any questions you have with your health care provider.

## 2014-11-08 ENCOUNTER — Emergency Department (HOSPITAL_COMMUNITY)
Admission: EM | Admit: 2014-11-08 | Discharge: 2014-11-08 | Disposition: A | Discharge status: Home / Self Care | Payer: BC Managed Care – PPO | Attending: Emergency Medicine | Admitting: Emergency Medicine

## 2014-11-08 DIAGNOSIS — Z8639 Personal history of other endocrine, nutritional and metabolic disease: Secondary | ICD-10-CM | POA: Diagnosis not present

## 2014-11-08 DIAGNOSIS — X58XXXD Exposure to other specified factors, subsequent encounter: Secondary | ICD-10-CM | POA: Diagnosis not present

## 2014-11-08 DIAGNOSIS — S76011D Strain of muscle, fascia and tendon of right hip, subsequent encounter: Secondary | ICD-10-CM | POA: Diagnosis not present

## 2014-11-08 DIAGNOSIS — Z7982 Long term (current) use of aspirin: Secondary | ICD-10-CM | POA: Insufficient documentation

## 2014-11-08 DIAGNOSIS — I10 Essential (primary) hypertension: Secondary | ICD-10-CM | POA: Diagnosis not present

## 2014-11-08 DIAGNOSIS — S76301S Unspecified injury of muscle, fascia and tendon of the posterior muscle group at thigh level, right thigh, sequela: Secondary | ICD-10-CM

## 2014-11-08 DIAGNOSIS — S79821S Other specified injuries of right thigh, sequela: Secondary | ICD-10-CM | POA: Insufficient documentation

## 2014-11-08 DIAGNOSIS — M79604 Pain in right leg: Secondary | ICD-10-CM | POA: Insufficient documentation

## 2014-11-08 DIAGNOSIS — Z79899 Other long term (current) drug therapy: Secondary | ICD-10-CM | POA: Diagnosis not present

## 2014-11-08 NOTE — ED Provider Notes (Signed)
CSN: [id number]     Arrival date & time 11/08/14  0818 History   First MD Initiated Contact with Patient 11/08/14 [phone]     No chief complaint on file.    (Consider location/radiation/quality/duration/timing/severity/associated sxs/prior Treatment) Patient is a 53 y.o. male presenting with leg pain.  Leg Pain Location:  Leg Time since incident:  5 days Injury: yes   Mechanism of injury comment:  Twisting Leg location:  R upper leg Pain details:    Quality:  Aching   Radiates to:  Does not radiate   Severity:  Mild   Onset quality:  Sudden Chronicity:  New Foreign body present:  No foreign bodies Relieved by:  Nothing Worsened by:  Flexion and adduction Ineffective treatments:  None tried Associated symptoms: no back pain, no fever, no muscle weakness, no neck pain, no numbness, no stiffness and no swelling     Past Medical History  Diagnosis Date  . Hypertension   . Hyperlipemia   . Dysrhythmia     sinus arrythmia-2006-work up negative findings   Past Surgical History  Procedure Laterality Date  . Ventral hernia repair    . Knee arthroscopy  2009    rt knee  . Knee arthroscopy  01/16/2012    Procedure: ARTHROSCOPY KNEE;  Surgeon: Gearlean Alf, MD;  Location: [facility];  Service: Orthopedics;  Laterality: Left;  WITH DEBRIDEMENT   No family history on file. History  Substance Use Topics  . Smoking status: Never Smoker   . Smokeless tobacco: Not on file  . Alcohol Use: No    Review of Systems  Constitutional: Negative for fever.  Musculoskeletal: Negative for back pain, stiffness and neck pain.  All other systems reviewed and are negative.     Allergies  Review of patient's allergies indicates no known allergies.  Home Medications   Prior to Admission medications   Medication Sig Start Date End Date Taking? Authorizing Provider  aspirin 81 MG tablet Take 81 mg by mouth daily.    Historical Provider, MD  fish oil-omega-3 fatty acids  1000 MG capsule Take 2 g by mouth daily.    Historical Provider, MD  Garlic 10 MG CAPS Take by mouth.    Historical Provider, MD  hydrochlorothiazide (HYDRODIURIL) 25 MG tablet Take 25 mg by mouth daily.    Historical Provider, MD  HYDROcodone-acetaminophen (NORCO/VICODIN) 5-325 MG per tablet Take 1 tablet by mouth every 4 (four) hours as needed. 11/07/14   Tanna Furry, MD  methocarbamol (ROBAXIN) 500 MG tablet Take 1 tablet (500 mg total) by mouth 2 (two) times daily. 11/07/14   Tanna Furry, MD  Multiple Vitamin (MULTIVITAMIN) tablet Take 1 tablet by mouth daily.    Historical Provider, MD  naproxen (NAPROSYN) 500 MG tablet Take 1 tablet (500 mg total) by mouth 2 (two) times daily. 11/07/14   Tanna Furry, MD   BP 146/88 mmHg  Pulse 66  Temp(Src) 97.9 F (36.6 C) (Oral)  Resp 16  SpO2 97% Physical Exam  Constitutional: He is oriented to person, place, and time. He appears well-developed and well-nourished.  HENT:  Head: Normocephalic and atraumatic.  Eyes: Conjunctivae and EOM are normal.  Neck: Normal range of motion. Neck supple.  Cardiovascular: Normal rate, regular rhythm and normal heart sounds.   Pulmonary/Chest: Effort normal and breath sounds normal. No respiratory distress.  Abdominal: He exhibits no distension. There is no tenderness. There is no rebound and no guarding.  Musculoskeletal: Normal range of motion.  Bruising  over R adductor muscle sheath.  2+ Pulses, no calf swelling or tenderness  Neurological: He is alert and oriented to person, place, and time.  Skin: Skin is warm and dry.  Vitals reviewed.   ED Course  Procedures (including critical care time) Labs Review Labs Reviewed - No data to display  Imaging Review No results found.   EKG Interpretation None      MDM   Final diagnoses:  None    53 y.o. male with pertinent PMH of HTN presents with stated concern of blood clot in right thigh. The patient was seen yesterday for similar symptoms, diagnosed  with partial tear of muscle. He presents today due to persistent concern of clot, no new symptoms. 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 His exam and history are consistent with partial tear of abductor group of right thigh. He has bruising overlying this area. symptoms. Doubt DVT and patient was given strict return precautions for DVT symptoms. He seemed relieved. Discharged home in stable condition with orthopedic and PCP follow-up.    1. Pain of right lower extremity   2. Hamstring injury, right, sequela   3. Strain of right hip adductor muscle, subsequent encounter         Debby Freiberg, MD 11/08/14 [phone]

## 2014-11-08 NOTE — ED Notes (Signed)
MD at bedside. 

## 2014-11-08 NOTE — Discharge Instructions (Signed)
Adductor Muscle Strain with Rehab  The adductor muscles of the thigh are responsible for moving the leg across the body and are susceptible to muscle strains. A strain is an injury to a muscle or a tendon that attaches the muscle to a bone. Strains of the adductor muscles occur where the muscle tendons attach to the pelvic bone. A muscle strain may be a complete or partial tear of the muscle and may involve one or more of the adductor muscles. These strains are usually classified as a grade 1 or 2 strain. A grade 1 strain has no obvious sign of tearing or stretching of the muscle or tendon, but may include significant inflammation. A grade 2 strain is a moderate strain in which the muscle or tendon has been partially torn and has been stretched. Grade 2 strains are usually accompanied with loss of strength. A grade 3 muscle strain rarely occurs in the adductor muscles. A grade 3 strain is a complete tear of the muscle or tendon. SYMPTOMS   Occasionally there is a sudden "pop" felt or heard in the groin or inner thigh at the time of injury.  There may be pain, tenderness, swelling, warmth, or redness over the inner thigh and groin. This may be worsened by moving the hip (especially when spreading the legs or hips, pushing the legs against each other or kicking with the affected leg). There may be bruising (contusion) in the groin and inner thigh within 48 hours following the injury.  There may be loss of fullness of the muscle with complete rupture (uncommon).  Muscle spasm in the groin and inner thigh can occur. CAUSES   Prolonged overuse or a sudden increase in intensity, frequency, or duration of activity.  Single episode of stressful overactivity, such as during kicking.  Single violent blow or force to the inner thigh (less common). RISK INCREASES WITH:  Sports that require repeated kicking (soccer, martial arts, football), as well as sports that require the legs to be brought together  (gymnastics, horseback riding).  Sports that require rapid acceleration (ice hockey, track and field).  Poor strength and flexibility.  Previous thigh injury. PREVENTION   Warm up and stretch properly before activity.  Maintain physical fitness:  Hip and thigh flexibility.  Muscle strength and endurance.  Cardiovascular fitness.  Complete the entire course of rehabilitation after any lower extremity injury. Do this before returning to competition or practice. Follow suggestions of your caregiver. PROGNOSIS  If treated properly, adductor muscle strains usually heal well within 2 to 6 weeks. RELATED COMPLICATIONS   Healing time will be prolonged if the condition is not appropriately treated. It needs adequate time to heal.  Do not return to activity too soon. Recurrence of symptoms and reinjury are possible.  If left untreated, the strain may progress to a complete tear (rare) or other injury caused by limping and favoring the injured leg.  Prolonged disability is possible. TREATMENT Treatment initially involves ice and medication to help reduce pain and inflammation. Strength and stretching exercises are recommended to maintain strength and a full range of motion. Strenuous activities should be modified to prevent further injury. Using crutches for the first few days may help to lessen pain. On rare occasions, surgery is necessary to reattach the tendon to the bone. If pain becomes persistent or chronic after more than 3 months of nonsurgical treatment, surgery may also be recommended. MEDICATION   If pain medication is necessary, nonsteroidal anti-inflammatory medications, such as aspirin and ibuprofen, or  other minor pain relievers, such as acetaminophen, are often recommended.  Do not take pain medication for 7 days before surgery or as advised.  Prescription pain relievers may be given. Use only as directed and only as much as you need.  Ointments applied to the skin may  be helpful.  Corticosteroid injections may be given to reduce inflammation. HEAT AND COLD  Cold treatment (icing) relieves pain and reduces inflammation. Cold treatment should be applied for 10 to 15 minutes every 2 to 3 hours for inflammation and pain and immediately after any activity that aggravates your symptoms. Use ice packs or an ice massage.  Heat treatment may be used prior to performing the stretching and strengthening activities prescribed by your caregiver, physical therapist, or athletic trainer. Use a heat pack or a warm soak. SEEK MEDICAL CARE IF:   Symptoms get worse or do not improve in 2 weeks, despite treatment.  New, unexplained symptoms develop. (Drugs used in treatment may produce side effects.)  Emergency Department Resource Guide 1) Find a Doctor and Pay Out of Pocket Although you won't have to find out who is covered by your insurance plan, it is a good idea to ask around and get recommendations. You will then need to call the office and see if the doctor you have chosen will accept you as a new patient and what types of options they offer for patients who are self-pay. Some doctors offer discounts or will set up payment plans for their patients who do not have insurance, but you will need to ask so you aren't surprised when you get to your appointment.  2) Contact Your Local Health Department Not all health departments have doctors that can see patients for sick visits, but many do, so it is worth a call to see if yours does. If you don't know where your local health department is, you can check in your phone book. The CDC also has a tool to help you locate your state's health department, and many state websites also have listings of all of their local health departments.  3) Find a [facility] If your illness is not likely to be very severe or complicated, you may want to try a walk in clinic. These are popping up all over the country in pharmacies, drugstores, and  shopping centers. They're usually staffed by nurse practitioners or physician assistants that have been trained to treat common illnesses and complaints. They're usually fairly quick and inexpensive. However, if you have serious medical issues or chronic medical problems, these are probably not your best option.  No Primary Care Doctor: - Call Health Connect at  [phone] - they can help you locate a primary care doctor that  accepts your insurance, provides certain services, etc. - Physician Referral Service- [phone]  Chronic Pain Problems: Organization         Address  Phone   Notes  [facility]  [phone] Patients need to be referred by their primary care doctor.   Medication Assistance: Organization         Address  Phone   Notes  [facility] Medication [facility] [address] [place], [place] [address] [phone] --Must be a resident of [facility] -- Must have NO insurance coverage whatsoever (no Medicaid/ Medicare, etc.) -- The pt. MUST have a primary care doctor that directs their care regularly and follows them in the community   [facility]ist  [phone]   [address]  (  [phone]    Agencies that provide inexpensive medical care: Organization         Address  Phone   Notes  [place]  [phone]   [facility]    [phone]   [facility] [place], [place] [address] [phone]   [place] [address], [place] [phone]   Planned Parenthood    [phone]   [facility]    [phone]   [place] and [place] [address], [place] Phone:  [phone], Fax:  [phone] Hours of Operation:  9 am - 6 pm, M-F.  Also accepts Medicaid/Medicare and self-pay.  [facility] for [place], [address], [place] Phone: [phone],  Fax: [phone]. Hours of Operation:  8:30 am - 5:30 pm, M-F.  Also accepts Medicaid and self-pay.  [facility] [place] [address] [place] Phone: [phone]   [place], [place], [place] [phone], Ext. [phone] Mondays & Thursdays: 7-9 AM.  First 15 patients are seen on a first come, first serve basis.    [place] Providers:  Organization         Address  Phone   Notes  [facility] [address], [place] [phone] Also accepts self-pay patients.  [facility] [address], [place]  [phone]   [place], [address], [place] [phone]   [facility] [address] [place] [phone]   Lucianne Lei [address], [place]   [phone] Only accepts [place] Access [place] patients after they have their name applied to their card.   Self-Pay (no insurance) in [facility]:  Organization         Address  Phone   Notes  Sickle Cell Patients, [facility] [place] [phone]   [facility] [place] [phone]   [facility] [place]  [place], [place], [place] [phone]   [facility]/Dr. Osei-Bonsu  [address], [place] or [address], [place] [phone] Phone number for both [place] and [place] locations is the same.  Urgent Medical and [facility] [address] [place] [phone]   [facility] [address] [place] or [address] [phone]   [facility] [address], [place] [phone], phone; [phone], fax Sees patients 1st and 3rd Saturday of every month.  Must not qualify for public or private  insurance (i.e. Medicaid, Medicare,  Health Choice, Veterans' Benefits)  Household income should be no more than 200% of the poverty level The clinic cannot treat you if you are pregnant or think you are pregnant  Sexually transmitted diseases are not treated at the clinic.    Dental Care: Organization         Address  Phone  Notes  [facility] [place] [phone] Accepts children up to age 30 who are enrolled in [place] or [place]; pregnant women with a Medicaid card; and children who  have applied for Medicaid or [place] Health Choice, but were declined, whose parents can pay a reduced fee at time of service.  [facility] Department of [facility]  [address], [place] [phone] Accepts children up to age 12 who are enrolled in [place] or [place]; pregnant women with a Medicaid card; and children who have applied for Medicaid or [place] Health Choice, but were declined, whose parents can pay a reduced fee at time of service.  [facility] Adult [facility] Access PROGRAM  [place] [phone] Patients are seen by appointment only. Walk-ins are not accepted. [place] will see patients 23 years of age and older. Monday - Tuesday (8am-5pm) Most Wednesdays (8:30-5pm) $30 per visit, cash only  [facility] Adult Dental Access PROGRAM  [address], [facility] [phone] Patients are seen by appointment only. Walk-ins are not accepted. [place] will see patients 60 years of age and older. One Wednesday Evening (Monthly: Volunteer Based).  $30 per visit, cash only  [place]  [phone] for adults; Children under age 18, call Graduate Pediatric Dentistry at [phone]. Children aged 30-14, please call [phone] to request a pediatric application.  Dental services are provided in all areas of dental care  including fillings, crowns and bridges, complete and partial dentures, implants, gum treatment, root canals, and extractions. Preventive care is also provided. Treatment is provided to both adults and children. Patients are selected via a lottery and there is often a waiting list.   [facility] [address], [place]  [phone] [email or web address]   [facility] [address] [place], [place] [phone], Ext. [phone] Second and Fourth Thursday of each month, opens at 6:30 AM; Clinic ends at 9 AM.  Patients are seen on a first-come first-served basis, and a limited number are seen during each clinic.   [facility]  [address] [place], [place] [phone]   Eligibility Requirements You must have lived in [place], [place], or [place] counties for at least the last three months.   You cannot be eligible for state or federal sponsored [employer], including [employer], [place], or [employer].   You generally cannot be eligible for healthcare insurance through your employer.    How to apply: Eligibility screenings are held every Tuesday and Wednesday afternoon from 1:00 pm until 4:00 pm. You do not need an appointment for the interview!  [facility] [address] [place], [place]   [place]  [place] Department  [place]  [phone]    Behavioral Health Resources in the Community: Intensive Outpatient Programs Organization         Address  Phone  Notes  [place]. [address] [place], [place] [phone]   [facility] Outpatient [address], [place], [place]   ADS: [facility] S[facility] [address] [place], [place]   [place] [address],  [place], [place] or [phone]     Substance Abuse Resources Organization         Address  Phone  Notes  Alcohol and Drug Services  [phone]   [facility]  [phone]   [facility] [place]   [facility]  [phone]   Residential &  Outpatient Substance Abuse Program  [phone]   Psychological Services Organization         Address  Phone  Notes  [facility] Behavioral Health  [place]  [phone]   [place] [address] [place] or [phone]    Mobile Crisis Teams Organization         Address  Phone  Notes  [facility], Mobile Crisis Care Unit  [phone]   [facility]  [address] [place], [place]   [place] [address] [place] [phone]    Self-Help/Support Groups Organization         Address  Phone             Notes  [place]. of [place] - variety of support groups  [place] Call for more information  Narcotics Anonymous (NA), [facility] [address], [employer] [place]  2 meetings at this location   Special educational needs teacher         Address  Phone  Notes  ASAP Residential Treatment [place],    [place]  [phone]   [facility]  [address], [place] [phone], [place], [place]   [place], [place] [phone] Admissions: 8am-3pm M-F  Incentives Substance [place] [address],    [place], [place] [phone]   [facility] [address] [place], [place], [place]   [facility] [address],  [place], [place]   [facility] - Intensive Outpatient [address], [place], [place]   [facility] [place].,  [place], [place] [phone] or [phone]   Residential Treatment  Services (RTS) [address] [place], [place] Accepts Medicaid  [facility] [place] [address]  [place] [phone] Substance Abuse/Addiction Treatment   [facility] Organization         Address  Phone  Notes  [facility]  [phone]   Domenic Schwab, PhD [address] Arlis [place], [place]   [phone] or [phone]   [place], [place] [phone]   [facility] [address], [place], [place] [phone] Insurance/Medicaid/sponsorship through [facility] and Families [address] [place]                                    [place], [place] [phone] [place] [address], [place] [phone]    Dr. Adele Schilder  [phone]   [facility] Dept. 1) [address] [place] 2) [place] 3)  [address], [place] [phone]  [phone]   [place] [phone] or [phone] (After Hours)

## 2015-03-14 ENCOUNTER — Encounter: Payer: Self-pay | Admitting: *Deleted

## 2015-03-14 ENCOUNTER — Telehealth: Payer: Self-pay | Admitting: *Deleted

## 2015-03-14 NOTE — Telephone Encounter (Signed)
Pre-Visit Call completed with patient and chart updated.   Pre-Visit Info documented in Specialty Comments under SnapShot.    

## 2015-03-15 ENCOUNTER — Encounter: Payer: Self-pay | Admitting: Family Medicine

## 2015-03-15 ENCOUNTER — Ambulatory Visit (INDEPENDENT_AMBULATORY_CARE_PROVIDER_SITE_OTHER): Payer: BLUE CROSS/BLUE SHIELD | Admitting: Family Medicine

## 2015-03-15 VITALS — BP 132/80 | HR 91 | Temp 98.3°F | Ht 66.0 in | Wt 160.4 lb

## 2015-03-15 DIAGNOSIS — Z0000 Encounter for general adult medical examination without abnormal findings: Secondary | ICD-10-CM | POA: Diagnosis not present

## 2015-03-15 DIAGNOSIS — E782 Mixed hyperlipidemia: Secondary | ICD-10-CM

## 2015-03-15 DIAGNOSIS — I10 Essential (primary) hypertension: Secondary | ICD-10-CM | POA: Insufficient documentation

## 2015-03-15 DIAGNOSIS — E785 Hyperlipidemia, unspecified: Secondary | ICD-10-CM | POA: Insufficient documentation

## 2015-03-15 DIAGNOSIS — T7840XA Allergy, unspecified, initial encounter: Secondary | ICD-10-CM

## 2015-03-15 DIAGNOSIS — R739 Hyperglycemia, unspecified: Secondary | ICD-10-CM

## 2015-03-15 HISTORY — DX: Hyperglycemia, unspecified: R73.9

## 2015-03-15 NOTE — Patient Instructions (Addendum)
Rel of rec Dr Nadara Mustard, [facility] Dr Collene Mares  Preventive Care for Adults A healthy lifestyle and preventive care can promote health and wellness. Preventive health guidelines for men include the following key practices:  A routine yearly physical is a good way to check with your health care provider about your health and preventative screening. It is a chance to share any concerns and updates on your health and to receive a thorough exam.  Visit your dentist for a routine exam and preventative care every 6 months. Brush your teeth twice a day and floss once a day. Good oral hygiene prevents tooth decay and gum disease.  The frequency of eye exams is based on your age, health, family medical history, use of contact lenses, and other factors. Follow your health care provider's recommendations for frequency of eye exams.  Eat a healthy diet. Foods such as vegetables, fruits, whole grains, low-fat dairy pro    ducts, and lean protein foods contain the nutrients you need without too many calories. Decrease your intake of foods high in solid fats, added sugars, and salt. Eat the right amount of calories for you.Get information about a proper diet from your health care provider, if necessary.  Regular physical exercise is one of the most important things you can do for your health. Most adults should get at least 150 minutes of moderate-intensity exercise (any activity that increases your heart rate and causes you to sweat) each week. In addition, most adults need muscle-strengthening exercises on 2 or more days a week.  Maintain a healthy weight. The body mass index (BMI) is a screening tool to identify possible weight problems. It provides an estimate of body fat based on height and weight. Your health care provider can find your BMI and can help you achieve or maintain a healthy weight.For adults 20 years and older:  A BMI below 18.5 is considered underweight.  A BMI of 18.5 to 24.9 is  normal.  A BMI of 25 to 29.9 is considered overweight.  A BMI of 30 and above is considered obese.  Maintain normal blood lipids and cholesterol levels by exercising and minimizing your intake of saturated fat. Eat a balanced diet with plenty of fruit and vegetables. Blood tests for lipids and cholesterol should begin at age 14 and be repeated every 5 years. If your lipid or cholesterol levels are high, you are over 50, or you are at high risk for heart disease, you may need your cholesterol levels checked more frequently.Ongoing high lipid and cholesterol levels should be treated with medicines if diet and exercise are not working.  If you smoke, find out from your health care provider how to quit. If you do not use tobacco, do not start.  Lung cancer screening is recommended for adults aged 66-80 years who are at high risk for developing lung cancer because of a history of smoking. A yearly low-dose CT scan of the lungs is recommended for people who have at least a 30-pack-year history of smoking and are a current smoker or have quit within the past 15 years. A pack year of smoking is smoking an average of 1 pack of cigarettes a day for 1 year (for example: 1 pack a day for 30 years or 2 packs a day for 15 years). Yearly screening should continue until the smoker has stopped smoking for at least 15 years. Yearly screening should be stopped for people who develop a health problem that would prevent them from  having lung cancer treatment.  If you choose to drink alcohol, do not have more than 2 drinks per day. One drink is considered to be 12 ounces (355 mL) of beer, 5 ounces (148 mL) of wine, or 1.5 ounces (44 mL) of liquor.  Avoid use of street drugs. Do not share needles with anyone. Ask for help if you need support or instructions about stopping the use of drugs.  High blood pressure causes heart disease and increases the risk of stroke. Your blood pressure should be checked at least every 1-2  years. Ongoing high blood pressure should be treated with medicines, if weight loss and exercise are not effective.  If you are 3-34 years old, ask your health care provider if you should take aspirin to prevent heart disease.  Diabetes screening involves taking a blood sample to check your fasting blood sugar level. This should be done once every 3 years, after age 88, if you are within normal weight and without risk factors for diabetes. Testing should be considered at a younger age or be carried out more frequently if you are overweight and have at least 1 risk factor for diabetes.  Colorectal cancer can be detected and often prevented. Most routine colorectal cancer screening begins at the age of 55 and continues through age 28. However, your health care provider may recommend screening at an earlier age if you have risk factors for colon cancer. On a yearly basis, your health care provider may provide home test kits to check for hidden blood in the stool. Use of a small camera at the end of a tube to directly examine the colon (sigmoidoscopy or colonoscopy) can detect the earliest forms of colorectal cancer. Talk to your health care provider about this at age 105, when routine screening begins. Direct exam of the colon should be repeated every 5-10 years through age 22, unless early forms of precancerous polyps or small growths are found.  People who are at an increased risk for hepatitis B should be screened for this virus. You are considered at high risk for hepatitis B if:  You were born in a country where hepatitis B occurs often. Talk with your health care provider about which countries are considered high risk.  Your parents were born in a high-risk country and you have not received a shot to protect against hepatitis B (hepatitis B vaccine).  You have HIV or AIDS.  You use needles to inject street drugs.  You live with, or have sex with, someone who has hepatitis B.  You are a man who  has sex with other men (MSM).  You get hemodialysis treatment.  You take certain medicines for conditions such as cancer, organ transplantation, and autoimmune conditions.  Hepatitis C blood testing is recommended for all people born from [birth year] through 1965 and any individual with known risks for hepatitis C.  Practice safe sex. Use condoms and avoid high-risk sexual practices to reduce the spread of sexually transmitted infections (STIs). STIs include gonorrhea, chlamydia, syphilis, trichomonas, herpes, HPV, and human immunodeficiency virus (HIV). Herpes, HIV, and HPV are viral illnesses that have no cure. They can result in disability, cancer, and death.  If you are at risk of being infected with HIV, it is recommended that you take a prescription medicine daily to prevent HIV infection. This is called preexposure prophylaxis (PrEP). You are considered at risk if:  You are a man who has sex with other men (MSM) and have other risk factors.  You are a heterosexual man, are sexually active, and are at increased risk for HIV infection.  You take drugs by injection.  You are sexually active with a partner who has HIV.  Talk with your health care provider about whether you are at high risk of being infected with HIV. If you choose to begin PrEP, you should first be tested for HIV. You should then be tested every 3 months for as long as you are taking PrEP.  A one-time screening for abdominal aortic aneurysm (AAA) and surgical repair of large AAAs by ultrasound are recommended for men ages 69 to 21 years who are current or former smokers.  Healthy men should no longer receive prostate-specific antigen (PSA) blood tests as part of routine cancer screening. Talk with your health care provider about prostate cancer screening.  Testicular cancer screening is not recommended for adult males who have no symptoms. Screening includes self-exam, a health care provider exam, and other screening tests.  Consult with your health care provider about any symptoms you have or any concerns you have about testicular cancer.  Use sunscreen. Apply sunscreen liberally and repeatedly throughout the day. You should seek shade when your shadow is shorter than you. Protect yourself by wearing long sleeves, pants, a wide-brimmed hat, and sunglasses year round, whenever you are outdoors.  Once a month, do a whole-body skin exam, using a mirror to look at the skin on your back. Tell your health care provider about new moles, moles that have irregular borders, moles that are larger than a pencil eraser, or moles that have changed in shape or color.  Stay current with required vaccines (immunizations).  Influenza vaccine. All adults should be immunized every year.  Tetanus, diphtheria, and acellular pertussis (Td, Tdap) vaccine. An adult who has not previously received Tdap or who does not know his vaccine status should receive 1 dose of Tdap. This initial dose should be followed by tetanus and diphtheria toxoids (Td) booster doses every 10 years. Adults with an unknown or incomplete history of completing a 3-dose immunization series with Td-containing vaccines should begin or complete a primary immunization series including a Tdap dose. Adults should receive a Td booster every 10 years.  Varicella vaccine. An adult without evidence of immunity to varicella should receive 2 doses or a second dose if he has previously received 1 dose.  Human papillomavirus (HPV) vaccine. Males aged 60-21 years who have not received the vaccine previously should receive the 3-dose series. Males aged 22-26 years may be immunized. Immunization is recommended through the age of 97 years for any male who has sex with males and did not get any or all doses earlier. Immunization is recommended for any person with an immunocompromised condition through the age of 66 years if he did not get any or all doses earlier. During the 3-dose series, the  second dose should be obtained 4-8 weeks after the first dose. The third dose should be obtained 24 weeks after the first dose and 16 weeks after the second dose.  Zoster vaccine. One dose is recommended for adults aged 30 years or older unless certain conditions are present.  Measles, mumps, and rubella (MMR) vaccine. Adults born before [birth year] generally are considered immune to measles and mumps. Adults born in [birth year] or later should have 1 or more doses of MMR vaccine unless there is a contraindication to the vaccine or there is laboratory evidence of immunity to each of the three diseases. A routine second dose of  MMR vaccine should be obtained at least 28 days after the first dose for students attending postsecondary schools, health care workers, or international travelers. People who received inactivated measles vaccine or an unknown type of measles vaccine during 1963-1967 should receive 2 doses of MMR vaccine. People who received inactivated mumps vaccine or an unknown type of mumps vaccine before 1979 and are at high risk for mumps infection should consider immunization with 2 doses of MMR vaccine. Unvaccinated health care workers born before [birth year] who lack laboratory evidence of measles, mumps, or rubella immunity or laboratory confirmation of disease should consider measles and mumps immunization with 2 doses of MMR vaccine or rubella immunization with 1 dose of MMR vaccine.  Pneumococcal 13-valent conjugate (PCV13) vaccine. When indicated, a person who is uncertain of his immunization history and has no record of immunization should receive the PCV13 vaccine. An adult aged 29 years or older who has certain medical conditions and has not been previously immunized should receive 1 dose of PCV13 vaccine. This PCV13 should be followed with a dose of pneumococcal polysaccharide (PPSV23) vaccine. The PPSV23 vaccine dose should be obtained at least 8 weeks after the dose of PCV13 vaccine. An adult aged 66 years  or older who has certain medical conditions and previously received 1 or more doses of PPSV23 vaccine should receive 1 dose of PCV13. The PCV13 vaccine dose should be obtained 1 or more years after the last PPSV23 vaccine dose.  Pneumococcal polysaccharide (PPSV23) vaccine. When PCV13 is also indicated, PCV13 should be obtained first. All adults aged 57 years and older should be immunized. An adult younger than age 31 years who has certain medical conditions should be immunized. Any person who resides in a nursing home or long-term care facility should be immunized. An adult smoker should be immunized. People with an immunocompromised condition and certain other conditions should receive both PCV13 and PPSV23 vaccines. People with human immunodeficiency virus (HIV) infection should be immunized as soon as possible after diagnosis. Immunization during chemotherapy or radiation therapy should be avoided. Routine use of PPSV23 vaccine is not recommended for American Indians, [place] Natives, or people younger than 65 years unless there are medical conditions that require PPSV23 vaccine. When indicated, people who have unknown immunization and have no record of immunization should receive PPSV23 vaccine. One-time revaccination 5 years after the first dose of PPSV23 is recommended for people aged 19-64 years who have chronic kidney failure, nephrotic syndrome, asplenia, or immunocompromised conditions. People who received 1-2 doses of PPSV23 before age 41 years should receive another dose of PPSV23 vaccine at age 33 years or later if at least 5 years have passed since the previous dose. Doses of PPSV23 are not needed for people immunized with PPSV23 at or after age 39 years.  Meningococcal vaccine. Adults with asplenia or persistent complement component deficiencies should receive 2 doses of quadrivalent meningococcal conjugate (MenACWY-D) vaccine. The doses should be obtained at least 2 months apart. Microbiologists  working with certain meningococcal bacteria, [employer] recruits, people at risk during an outbreak, and people who travel to or live in countries with a high rate of meningitis should be immunized. A first-year college student up through age 33 years who is living in a residence hall should receive a dose if he did not receive a dose on or after his 16th birthday. Adults who have certain high-risk conditions should receive one or more doses of vaccine.  Hepatitis A vaccine. Adults who wish to be protected from this  disease, have certain high-risk conditions, work with hepatitis A-infected animals, work in hepatitis A research labs, or travel to or work in countries with a high rate of hepatitis A should be immunized. Adults who were previously unvaccinated and who anticipate close contact with an international adoptee during the first 60 days after arrival in the [place] from a country with a high rate of hepatitis A should be immunized.  Hepatitis B vaccine. Adults should be immunized if they wish to be protected from this disease, have certain high-risk conditions, may be exposed to blood or other infectious body fluids, are household contacts or sex partners of hepatitis B positive people, are clients or workers in certain care facilities, or travel to or work in countries with a high rate of hepatitis B.  Haemophilus influenzae type b (Hib) vaccine. A previously unvaccinated person with asplenia or sickle cell disease or having a scheduled splenectomy should receive 1 dose of Hib vaccine. Regardless of previous immunization, a recipient of a hematopoietic stem cell transplant should receive a 3-dose series 6-12 months after his successful transplant. Hib vaccine is not recommended for adults with HIV infection. Preventive Service / Frequency Ages 23 to 22  Blood pressure check.** / Every 1 to 2 years.  Lipid and cholesterol check.** / Every 5 years beginning at age 73.  Hepatitis C blood  test.** / For any individual with known risks for hepatitis C.  Skin self-exam. / Monthly.  Influenza vaccine. / Every year.  Tetanus, diphtheria, and acellular pertussis (Tdap, Td) vaccine.** / Consult your health care provider. 1 dose of Td every 10 years.  Varicella vaccine.** / Consult your health care provider.  HPV vaccine. / 3 doses over 6 months, if 80 or younger.  Measles, mumps, rubella (MMR) vaccine.** / You need at least 1 dose of MMR if you were born in 1957 or later. You may also need a second dose.  Pneumococcal 13-valent conjugate (PCV13) vaccine.** / Consult your health care provider.  Pneumococcal polysaccharide (PPSV23) vaccine.** / 1 to 2 doses if you smoke cigarettes or if you have certain conditions.  Meningococcal vaccine.** / 1 dose if you are age 55 to 32 years and a Market researcher living in a residence hall, or have one of several medical conditions. You may also need additional booster doses.  Hepatitis A vaccine.** / Consult your health care provider.  Hepatitis B vaccine.** / Consult your health care provider.  Haemophilus influenzae type b (Hib) vaccine.** / Consult your health care provider. Ages 65 to 2  Blood pressure check.** / Every 1 to 2 years.  Lipid and cholesterol check.** / Every 5 years beginning at age 1.  Lung cancer screening. / Every year if you are aged 20-80 years and have a 30-pack-year history of smoking and currently smoke or have quit within the past 15 years. Yearly screening is stopped once you have quit smoking for at least 15 years or develop a health problem that would prevent you from having lung cancer treatment.  Fecal occult blood test (FOBT) of stool. / Every year beginning at age 36 and continuing until age 49. You may not have to do this test if you get a colonoscopy every 10 years.  Flexible sigmoidoscopy** or colonoscopy.** / Every 5 years for a flexible sigmoidoscopy or every 10 years for a colonoscopy  beginning at age 48 and continuing until age 53.  Hepatitis C blood test.** / For all people born from [birth year] through 1965 and any  individual with known risks for hepatitis C.  Skin self-exam. / Monthly.  Influenza vaccine. / Every year.  Tetanus, diphtheria, and acellular pertussis (Tdap/Td) vaccine.** / Consult your health care provider. 1 dose of Td every 10 years.  Varicella vaccine.** / Consult your health care provider.  Zoster vaccine.** / 1 dose for adults aged 20 years or older.  Measles, mumps, rubella (MMR) vaccine.** / You need at least 1 dose of MMR if you were born in 1959 or later. You may also need a second dose.  Pneumococcal 13-valent conjugate (PCV13) vaccine.** / Consult your health care provider.  Pneumococcal polysaccharide (PPSV23) vaccine.** / 1 to 2 doses if you smoke cigarettes or if you have certain conditions.  Meningococcal vaccine.** / Consult your health care provider.  Hepatitis A vaccine.** / Consult your health care provider.  Hepatitis B vaccine.** / Consult your health care provider.  Haemophilus influenzae type b (Hib) vaccine.** / Consult your health care provider. Ages 16 and over  Blood pressure check.** / Every 1 to 2 years.  Lipid and cholesterol check.**/ Every 5 years beginning at age 92.  Lung cancer screening. / Every year if you are aged 43-80 years and have a 30-pack-year history of smoking and currently smoke or have quit within the past 15 years. Yearly screening is stopped once you have quit smoking for at least 15 years or develop a health problem that would prevent you from having lung cancer treatment.  Fecal occult blood test (FOBT) of stool. / Every year beginning at age 2 and continuing until age 38. You may not have to do this test if you get a colonoscopy every 10 years.  Flexible sigmoidoscopy** or colonoscopy.** / Every 5 years for a flexible sigmoidoscopy or every 10 years for a colonoscopy beginning at age 62 and  continuing until age 60.  Hepatitis C blood test.** / For all people born from [birth year] through 1967 and any individual with known risks for hepatitis C.  Abdominal aortic aneurysm (AAA) screening.** / A one-time screening for ages 49 to 22 years who are current or former smokers.  Skin self-exam. / Monthly.  Influenza vaccine. / Every year.  Tetanus, diphtheria, and acellular pertussis (Tdap/Td) vaccine.** / 1 dose of Td every 10 years.  Varicella vaccine.** / Consult your health care provider.  Zoster vaccine.** / 1 dose for adults aged 71 years or older.  Pneumococcal 13-valent conjugate (PCV13) vaccine.** / Consult your health care provider.  Pneumococcal polysaccharide (PPSV23) vaccine.** / 1 dose for all adults aged 88 years and older.  Meningococcal vaccine.** / Consult your health care provider.  Hepatitis A vaccine.** / Consult your health care provider.  Hepatitis B vaccine.** / Consult your health care provider.  Haemophilus influenzae type b (Hib) vaccine.** / Consult your health care provider. **Family history and personal history of risk and conditions may change your health care provider's recommendations. Document Released: 01/08/2002 Document Revised: 11/17/2013 Document Reviewed: 04/09/2011 [facility] Patient Information 2015 [place], [place]. This information is not intended to replace advice given to you by your health care provider. Make sure you discuss any questions you have with your health care provider.

## 2015-03-15 NOTE — Progress Notes (Signed)
Pre visit review using our clinic review tool, if applicable. No additional management support is needed unless otherwise documented below in the visit note. 

## 2015-03-16 LAB — COMPREHENSIVE METABOLIC PANEL
ALBUMIN: 4.4 g/dL (ref 3.5–5.2)
ALT: 24 U/L (ref 0–53)
AST: 25 U/L (ref 0–37)
Alkaline Phosphatase: 45 U/L (ref 39–117)
BUN: 18 mg/dL (ref 6–23)
CHLORIDE: 108 meq/L (ref 96–112)
CO2: 31 meq/L (ref 19–32)
CREATININE: 1.22 mg/dL (ref 0.40–1.50)
Calcium: 10.3 mg/dL (ref 8.4–10.5)
GFR: 79.08 mL/min (ref >60.00)
Glucose, Bld: 77 mg/dL (ref 70–99)
POTASSIUM: 3.8 meq/L (ref 3.5–5.1)
SODIUM: 144 meq/L (ref 135–145)
TOTAL PROTEIN: 7.5 g/dL (ref 6.0–8.3)
Total Bilirubin: 0.6 mg/dL (ref 0.2–1.2)

## 2015-03-16 LAB — CBC
HEMATOCRIT: 46.3 % (ref 39.0–52.0)
HEMOGLOBIN: 15.6 g/dL (ref 13.0–17.0)
MCHC: 33.7 g/dL (ref 30.0–36.0)
MCV: 85.6 fl (ref 78.0–100.0)
PLATELETS: 212 10*3/uL (ref 150.0–400.0)
RBC: 5.41 Mil/uL (ref 4.22–5.81)
RDW: 14.6 % (ref 11.5–15.5)
WBC: 5.7 10*3/uL (ref 4.0–10.5)

## 2015-03-16 LAB — LIPID PANEL
CHOL/HDL RATIO: 4
Cholesterol: 247 mg/dL — ABNORMAL HIGH (ref 0–200)
HDL: 60.2 mg/dL (ref >39.00)
LDL Cholesterol: 175 mg/dL — ABNORMAL HIGH (ref 0–99)
NONHDL: 186.8
TRIGLYCERIDES: 59 mg/dL (ref 0.0–149.0)
VLDL: 11.8 mg/dL (ref 0.0–40.0)

## 2015-03-16 LAB — HEMOGLOBIN A1C: HEMOGLOBIN A1C: 6 % (ref 4.6–6.5)

## 2015-03-16 LAB — TSH: TSH: 1.14 u[IU]/mL (ref 0.35–4.50)

## 2015-03-20 ENCOUNTER — Encounter: Payer: Self-pay | Admitting: Family Medicine

## 2015-03-20 DIAGNOSIS — Z0000 Encounter for general adult medical examination without abnormal findings: Secondary | ICD-10-CM

## 2015-03-20 HISTORY — DX: Encounter for general adult medical examination without abnormal findings: Z00.00

## 2015-03-20 NOTE — Progress Notes (Signed)
Tommy Farrell  [id number] 1961/09/29 03/20/2015      Progress Note-Follow Up  Subjective  Chief Complaint  Chief Complaint  Patient presents with  . Establish Care    HPI  Patient is a 54 y.o. male in today for routine medical care. Patient is in today to establish care. Had history of knee pain and calf pain but no significant pain today. Falls with vision loss in [facility] but no recent visual changes. Exercises regularly. Uses an exercise bike and does calisthenics. Tries to eat a heart healthy diet. No recent illness. Denies CP/palp/SOB/HA/congestion/fevers/GI or GU c/o. Taking meds as prescribed  Past Medical History  Diagnosis Date  . Dysrhythmia     sinus arrythmia-2006-work up negative findings  . Hyperlipemia   . Hypertension   . Allergy   . Hyperglycemia 03/15/2015  . Preventative health care 03/20/2015    Sees Dr Collene Mares of gastroenterology    Past Surgical History  Procedure Laterality Date  . Ventral hernia repair    . Knee arthroscopy  2009    rt knee  . Knee arthroscopy  01/16/2012    Procedure: ARTHROSCOPY KNEE;  Surgeon: Gearlean Alf, MD;  Location: [facility];  Service: Orthopedics;  Laterality: Left;  WITH DEBRIDEMENT    Family History  Problem Relation Age of Onset  . Hypertension Mother   . Diabetes Mother     labile  . Kidney disease Mother   . Peripheral vascular disease Mother   . Diabetes Maternal Aunt   . Heart disease Maternal Aunt   . Heart disease Father     MI?  . Drug abuse Son     History   Social History  . Marital Status: Married    Spouse Name: Delora  . Number of Children: N/A  . Years of Education: 12   Occupational History  . 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 Smoking status: Never Smoker   . Smokeless tobacco: Not on file  . Alcohol Use: No  . Drug Use: No  . Sexual Activity: Yes     Comment: lives with wife, no major dietary restrictions, works for [employer]   Other  Topics Concern  . Not on file   Social History Narrative    Current Outpatient Prescriptions on File Prior to Visit  Medication Sig Dispense Refill  . aspirin 81 MG tablet Take 81 mg by mouth daily.    . fish oil-omega-3 fatty acids 1000 MG capsule Take 2 g by mouth daily.    . Garlic 10 MG CAPS Take by mouth.    Marland Kitchen glucosamine-chondroitin 500-400 MG tablet Take 1 tablet by mouth 3 (three) times daily.    . hydrochlorothiazide (HYDRODIURIL) 25 MG tablet Take 25 mg by mouth daily.    . Multiple Vitamin (MULTIVITAMIN) tablet Take 1 tablet by mouth daily.    . Red Yeast Rice 600 MG CAPS Take 1 tablet by mouth daily.     No current facility-administered medications on file prior to visit.    No Known Allergies  Review of Systems  Review of Systems  Constitutional: Negative for fever, chills and malaise/fatigue.  HENT: Negative for congestion, hearing loss and nosebleeds.   Eyes: Negative for discharge.  Respiratory: Negative for cough, sputum production, shortness of breath and wheezing.   Cardiovascular: Negative for chest pain, palpitations and leg swelling.  Gastrointestinal: Negative for heartburn, nausea, vomiting, abdominal pain, diarrhea, constipation and blood in stool.  Genitourinary: Negative for dysuria, urgency, frequency and hematuria.  Musculoskeletal: Negative for myalgias, back pain and falls.  Skin: Negative for rash.  Neurological: Negative for dizziness, tremors, sensory change, focal weakness, loss of consciousness, weakness and headaches.  Endo/Heme/Allergies: Negative for polydipsia. Does not bruise/bleed easily.  Psychiatric/Behavioral: Negative for depression and suicidal ideas. The patient is not nervous/anxious and does not have insomnia.     Objective  BP 132/80 mmHg  Pulse 91  Temp(Src) 98.3 F (36.8 C) (Oral)  Ht 5\' 6"  (1.676 m)  Wt 160 lb 6 oz (72.746 kg)  BMI 25.90 kg/m2  SpO2 96%  Physical Exam  Physical Exam  Constitutional: He is  oriented to person, place, and time and well-developed, well-nourished, and in no distress. No distress.  HENT:  Head: Normocephalic and atraumatic.  Eyes: Conjunctivae are normal.  Neck: Neck supple. No thyromegaly present.  Cardiovascular: Normal rate, regular rhythm and normal heart sounds.   No murmur heard. Pulmonary/Chest: Effort normal and breath sounds normal. No respiratory distress.  Abdominal: He exhibits no distension and no mass. There is no tenderness.  Musculoskeletal: He exhibits no edema.  Neurological: He is alert and oriented to person, place, and time.  Skin: Skin is warm.  Psychiatric: Memory, affect and judgment normal.    Lab Results  Component Value Date   TSH 1.14 03/15/2015   Lab Results  Component Value Date   WBC 5.7 03/15/2015   HGB 15.6 03/15/2015   HCT 46.3 03/15/2015   MCV 85.6 03/15/2015   PLT 212.0 03/15/2015   Lab Results  Component Value Date   CREATININE 1.22 03/15/2015   BUN 18 03/15/2015   NA 144 03/15/2015   K 3.8 03/15/2015   CL 108 03/15/2015   CO2 31 03/15/2015   Lab Results  Component Value Date   ALT 24 03/15/2015   AST 25 03/15/2015   ALKPHOS 45 03/15/2015   BILITOT 0.6 03/15/2015   Lab Results  Component Value Date   CHOL 247* 03/15/2015   Lab Results  Component Value Date   HDL 60.20 03/15/2015   Lab Results  Component Value Date   LDLCALC 175* 03/15/2015   Lab Results  Component Value Date   TRIG 59.0 03/15/2015   Lab Results  Component Value Date   CHOLHDL 4 03/15/2015     Assessment & Plan  Hypertension Well controlled, no changes to meds. Encouraged heart healthy diet such as the DASH diet and exercise as tolerated.    Hyperglycemia hgba1c acceptable, minimize simple carbs. Increase exercise as tolerated.    Allergy May use Zyrtec daily and consider adding Flonase as needed   Hyperlipemia Encouraged heart healthy diet, increase exercise, avoid trans fats, consider a krill oil cap  daily. May continue Red Yeast Rice for now.    Preventative health care Patient encouraged to maintain heart healthy diet, regular exercise, adequate sleep. Consider daily probiotics. Take medications as prescribed. Requesting old records. Will proceed with repeat lab work at next visit.

## 2015-03-20 NOTE — Assessment & Plan Note (Signed)
Well controlled, no changes to meds. Encouraged heart healthy diet such as the DASH diet and exercise as tolerated.  °

## 2015-03-20 NOTE — Assessment & Plan Note (Signed)
hgba1c acceptable, minimize simple carbs. Increase exercise as tolerated.  

## 2015-03-20 NOTE — Assessment & Plan Note (Signed)
Patient encouraged to maintain heart healthy diet, regular exercise, adequate sleep. Consider daily probiotics. Take medications as prescribed. Requesting old records. Will proceed with repeat lab work at next visit.

## 2015-03-20 NOTE — Assessment & Plan Note (Signed)
Encouraged heart healthy diet, increase exercise, avoid trans fats, consider a krill oil cap daily. May continue Red Yeast Rice for now.

## 2015-03-20 NOTE — Assessment & Plan Note (Signed)
May use Zyrtec daily and consider adding Flonase as needed

## 2015-03-22 ENCOUNTER — Telehealth: Payer: Self-pay | Admitting: Family Medicine

## 2015-03-22 ENCOUNTER — Other Ambulatory Visit: Payer: Self-pay | Admitting: Family Medicine

## 2015-03-22 DIAGNOSIS — M25579 Pain in unspecified ankle and joints of unspecified foot: Secondary | ICD-10-CM

## 2015-03-22 NOTE — Telephone Encounter (Signed)
Caller name: Wade Relation to pt: self Call back number: [phone] Pharmacy:  Reason for call:   Patient thought that Dr. Charlett Blake was going to refer him to a podiatrist off of [address]. I don't see that referral has been placed.

## 2015-08-05 ENCOUNTER — Telehealth: Payer: Self-pay | Admitting: Family Medicine

## 2015-08-05 MED ORDER — HYDROCHLOROTHIAZIDE 25 MG PO TABS: 25.0000 mg | ORAL_TABLET | Freq: Every day | ORAL | Status: DC

## 2015-08-05 NOTE — Telephone Encounter (Signed)
[facility] on [address]  Reason for call: Pt needs refill on hydrochlorothiazide. Has 1 1/2 weeks. Takes 1/day. Scheduled for appt 09/19/15.

## 2015-08-05 NOTE — Telephone Encounter (Signed)
Refill done.  

## 2015-09-19 ENCOUNTER — Other Ambulatory Visit: Payer: Self-pay | Admitting: Family Medicine

## 2015-09-19 ENCOUNTER — Ambulatory Visit (INDEPENDENT_AMBULATORY_CARE_PROVIDER_SITE_OTHER): Payer: BLUE CROSS/BLUE SHIELD | Admitting: Family Medicine

## 2015-09-19 ENCOUNTER — Ambulatory Visit (HOSPITAL_BASED_OUTPATIENT_CLINIC_OR_DEPARTMENT_OTHER)
Admission: RE | Admit: 2015-09-19 | Discharge: 2015-09-19 | Disposition: A | Discharge status: Home / Self Care | Payer: BLUE CROSS/BLUE SHIELD | Source: Ambulatory Visit | Attending: Family Medicine | Admitting: Family Medicine

## 2015-09-19 ENCOUNTER — Other Ambulatory Visit: Payer: BLUE CROSS/BLUE SHIELD

## 2015-09-19 ENCOUNTER — Encounter: Payer: Self-pay | Admitting: Family Medicine

## 2015-09-19 VITALS — BP 120/82 | HR 85 | Temp 98.0°F | Ht 66.0 in | Wt 161.5 lb

## 2015-09-19 DIAGNOSIS — R319 Hematuria, unspecified: Secondary | ICD-10-CM

## 2015-09-19 DIAGNOSIS — N50811 Right testicular pain: Secondary | ICD-10-CM

## 2015-09-19 DIAGNOSIS — R1031 Right lower quadrant pain: Secondary | ICD-10-CM

## 2015-09-19 DIAGNOSIS — I861 Scrotal varices: Secondary | ICD-10-CM | POA: Diagnosis not present

## 2015-09-19 DIAGNOSIS — I10 Essential (primary) hypertension: Secondary | ICD-10-CM

## 2015-09-19 DIAGNOSIS — N5082 Scrotal pain: Secondary | ICD-10-CM | POA: Insufficient documentation

## 2015-09-19 DIAGNOSIS — E785 Hyperlipidemia, unspecified: Secondary | ICD-10-CM | POA: Diagnosis not present

## 2015-09-19 DIAGNOSIS — R739 Hyperglycemia, unspecified: Secondary | ICD-10-CM

## 2015-09-19 LAB — URINALYSIS
BILIRUBIN URINE: NEGATIVE
KETONES UR: NEGATIVE
LEUKOCYTES UA: NEGATIVE
NITRITE: NEGATIVE
Specific Gravity, Urine: 1.02 (ref 1.000–1.030)
Total Protein, Urine: NEGATIVE
UROBILINOGEN UA: 0.2 (ref 0.0–1.0)
Urine Glucose: NEGATIVE
pH: 7 (ref 5.0–8.0)

## 2015-09-19 LAB — COMPREHENSIVE METABOLIC PANEL
ALBUMIN: 4.2 g/dL (ref 3.5–5.2)
ALK PHOS: 42 U/L (ref 39–117)
ALT: 21 U/L (ref 0–53)
AST: 21 U/L (ref 0–37)
BUN: 19 mg/dL (ref 6–23)
CHLORIDE: 102 meq/L (ref 96–112)
CO2: 31 mEq/L (ref 19–32)
Calcium: 9.7 mg/dL (ref 8.4–10.5)
Creatinine, Ser: 1.08 mg/dL (ref 0.40–1.50)
GFR: 90.85 mL/min (ref >60.00)
GLUCOSE: 106 mg/dL — AB (ref 70–99)
POTASSIUM: 3.7 meq/L (ref 3.5–5.1)
SODIUM: 141 meq/L (ref 135–145)
TOTAL PROTEIN: 6.9 g/dL (ref 6.0–8.3)
Total Bilirubin: 0.7 mg/dL (ref 0.2–1.2)

## 2015-09-19 LAB — CBC
HEMATOCRIT: 47.1 % (ref 39.0–52.0)
HEMOGLOBIN: 15.7 g/dL (ref 13.0–17.0)
MCHC: 33.4 g/dL (ref 30.0–36.0)
MCV: 86.8 fl (ref 78.0–100.0)
Platelets: 205 10*3/uL (ref 150.0–400.0)
RBC: 5.42 Mil/uL (ref 4.22–5.81)
RDW: 14.5 % (ref 11.5–15.5)
WBC: 4.1 10*3/uL (ref 4.0–10.5)

## 2015-09-19 LAB — TSH: TSH: 1.54 u[IU]/mL (ref 0.35–4.50)

## 2015-09-19 LAB — LIPID PANEL
Cholesterol: 239 mg/dL — ABNORMAL HIGH (ref 0–200)
HDL: 62.5 mg/dL (ref >39.00)
LDL CALC: 164 mg/dL — AB (ref 0–99)
NONHDL: 176.84
Total CHOL/HDL Ratio: 4
Triglycerides: 63 mg/dL (ref 0.0–149.0)
VLDL: 12.6 mg/dL (ref 0.0–40.0)

## 2015-09-19 LAB — PSA: PSA: 2.78 ng/mL (ref 0.10–4.00)

## 2015-09-19 MED ORDER — HYDROCHLOROTHIAZIDE 25 MG PO TABS: 25.0000 mg | ORAL_TABLET | Freq: Every day | ORAL | Status: DC

## 2015-09-19 NOTE — Progress Notes (Signed)
Pre visit review using our clinic review tool, if applicable. No additional management support is needed unless otherwise documented below in the visit note. 

## 2015-09-19 NOTE — Patient Instructions (Signed)
Hypertension Hypertension, commonly called high blood pressure, is when the force of blood pumping through your arteries is too strong. Your arteries are the blood vessels that carry blood from your heart throughout your body. A blood pressure reading consists of a higher number over a lower number, such as 110/72. The higher number (systolic) is the pressure inside your arteries when your heart pumps. The lower number (diastolic) is the pressure inside your arteries when your heart relaxes. Ideally you want your blood pressure below 120/80. Hypertension forces your heart to work harder to pump blood. Your arteries may become narrow or stiff. Having untreated or uncontrolled hypertension can cause heart attack, stroke, kidney disease, and other problems. RISK FACTORS Some risk factors for high blood pressure are controllable. Others are not.  Risk factors you cannot control include:   Race. You may be at higher risk if you are African American.  Age. Risk increases with age.  Gender. Men are at higher risk than women before age 45 years. After age 65, women are at higher risk than men. Risk factors you can control include:  Not getting enough exercise or physical activity.  Being overweight.  Getting too much fat, sugar, calories, or salt in your diet.  Drinking too much alcohol. SIGNS AND SYMPTOMS Hypertension does not usually cause signs or symptoms. Extremely high blood pressure (hypertensive crisis) may cause headache, anxiety, shortness of breath, and nosebleed. DIAGNOSIS To check if you have hypertension, your health care provider will measure your blood pressure while you are seated, with your arm held at the level of your heart. It should be measured at least twice using the same arm. Certain conditions can cause a difference in blood pressure between your right and left arms. A blood pressure reading that is higher than normal on one occasion does not mean that you need treatment. If  it is not clear whether you have high blood pressure, you may be asked to return on a different day to have your blood pressure checked again. Or, you may be asked to monitor your blood pressure at home for 1 or more weeks. TREATMENT Treating high blood pressure includes making lifestyle changes and possibly taking medicine. Living a healthy lifestyle can help lower high blood pressure. You may need to change some of your habits. Lifestyle changes may include:  Following the DASH diet. This diet is high in fruits, vegetables, and whole grains. It is low in salt, red meat, and added sugars.  Keep your sodium intake below 2,300 mg per day.  Getting at least 30-45 minutes of aerobic exercise at least 4 times per week.  Losing weight if necessary.  Not smoking.  Limiting alcoholic beverages.  Learning ways to reduce stress. Your health care provider may prescribe medicine if lifestyle changes are not enough to get your blood pressure under control, and if one of the following is true:  You are 18-59 years of age and your systolic blood pressure is above 140.  You are 60 years of age or older, and your systolic blood pressure is above 150.  Your diastolic blood pressure is above 90.  You have diabetes, and your systolic blood pressure is over 140 or your diastolic blood pressure is over 90.  You have kidney disease and your blood pressure is above 140/90.  You have heart disease and your blood pressure is above 140/90. Your personal target blood pressure may vary depending on your medical conditions, your age, and other factors. HOME CARE INSTRUCTIONS    Have your blood pressure rechecked as directed by your health care provider.   Take medicines only as directed by your health care provider. Follow the directions carefully. Blood pressure medicines must be taken as prescribed. The medicine does not work as well when you skip doses. Skipping doses also puts you at risk for  problems.  Do not smoke.   Monitor your blood pressure at home as directed by your health care provider. SEEK MEDICAL CARE IF:   You think you are having a reaction to medicines taken.  You have recurrent headaches or feel dizzy.  You have swelling in your ankles.  You have trouble with your vision. SEEK IMMEDIATE MEDICAL CARE IF:  You develop a severe headache or confusion.  You have unusual weakness, numbness, or feel faint.  You have severe chest or abdominal pain.  You vomit repeatedly.  You have trouble breathing. MAKE SURE YOU:   Understand these instructions.  Will watch your condition.  Will get help right away if you are not doing well or get worse.   This information is not intended to replace advice given to you by your health care provider. Make sure you discuss any questions you have with your health care provider.   Document Released: 11/12/2005 Document Revised: 03/29/2015 Document Reviewed: 09/04/2013 Elsevier Interactive Patient Education 2016 Elsevier Inc.  

## 2015-09-19 NOTE — Progress Notes (Signed)
Subjective:    [name], male    DOB: October 30, 1961, 54 y.o.   MRN: [id number]  Chief Complaint  Patient presents with  . Follow-up    6 month    HPI Patient is in today for follow-up. His greatest complaint is of some discomfort he describes in his right groin. It has been present for a couple of days. It is in the upper thigh radiating into the base of the testicles. There's been no urinary symptoms. He denies any penile discharge or new partners. No fevers or chills. No malaise. No dysuria. Denies any falls or trauma and pain is present but not severe enough to keep him awake. Denies CP/palp/SOB/HA/congestion/fevers/GI or GU c/o. Taking meds as prescribed  Past Medical History  Diagnosis Date  . Dysrhythmia     sinus arrythmia-2006-work up negative findings  . Hyperlipemia   . Hypertension   . Allergy   . Hyperglycemia 03/15/2015  . Preventative health care 03/20/2015    Sees Dr Collene Mares of gastroenterology    Past Surgical History  Procedure Laterality Date  . Ventral hernia repair    . Knee arthroscopy  2009    rt knee  . Knee arthroscopy  01/16/2012    Procedure: ARTHROSCOPY KNEE;  Surgeon: Gearlean Alf, MD;  Location: [facility];  Service: Orthopedics;  Laterality: Left;  WITH DEBRIDEMENT    Family History  Problem Relation Age of Onset  . Hypertension Mother   . Diabetes Mother     labile  . Kidney disease Mother   . Peripheral vascular disease Mother   . Diabetes Maternal Aunt   . Heart disease Maternal Aunt   . Heart disease Father     MI?  . Drug abuse Son     Social History   Social History  . Marital Status: Married    Spouse Name: Delora  . Number of Children: N/A  . Years of Education: 12   Occupational History  . 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 Smoking status: Never Smoker   . Smokeless tobacco: Not on file  . Alcohol Use: No  . Drug Use: No  . Sexual Activity: Yes     Comment:  lives with wife, no major dietary restrictions, works for [employer]   Other Topics Concern  . Not on file   Social History Narrative    Outpatient Prescriptions Prior to Visit  Medication Sig Dispense Refill  . aspirin 81 MG tablet Take 81 mg by mouth daily.    . fish oil-omega-3 fatty acids 1000 MG capsule Take 2 g by mouth daily.    . Garlic 10 MG CAPS Take by mouth.    Marland Kitchen glucosamine-chondroitin 500-400 MG tablet Take 1 tablet by mouth 3 (three) times daily.    . Multiple Vitamin (MULTIVITAMIN) tablet Take 1 tablet by mouth daily.    . Red Yeast Rice 600 MG CAPS Take 1 tablet by mouth daily.    . hydrochlorothiazide (HYDRODIURIL) 25 MG tablet Take 1 tablet (25 mg total) by mouth daily. 30 tablet 2   No facility-administered medications prior to visit.    No Known Allergies  Review of Systems  Constitutional: Negative for fever and malaise/fatigue.  HENT: Negative for congestion.   Eyes: Negative for discharge.  Respiratory: Negative for shortness of breath.   Cardiovascular: Negative for chest pain, palpitations and leg swelling.  Gastrointestinal: Negative for nausea and abdominal pain.  Genitourinary:  Negative for dysuria.  Musculoskeletal: Positive for myalgias. Negative for falls.       2 years of pain/soreness off and on in right groin, no correlation seen with urinary habits, movements. No radicular symptoms. Occurs maybe once a month can last up to a week  Skin: Negative for rash.  Neurological: Negative for loss of consciousness and headaches.  Endo/Heme/Allergies: Negative for environmental allergies.  Psychiatric/Behavioral: Negative for depression. The patient is not nervous/anxious.        Objective:    Physical Exam  Constitutional: He is oriented to person, place, and time. He appears well-developed and well-nourished. No distress.  HENT:  Head: Normocephalic and atraumatic.  Nose: Nose normal.  Eyes: Right eye exhibits no discharge. Left eye exhibits no  discharge.  Neck: Normal range of motion. Neck supple.  Cardiovascular: Normal rate and regular rhythm.   No murmur heard. Pulmonary/Chest: Effort normal and breath sounds normal.  Abdominal: Soft. Bowel sounds are normal. There is no tenderness.  Musculoskeletal: He exhibits no edema.  Neurological: He is alert and oriented to person, place, and time.  Skin: Skin is warm and dry.  Psychiatric: He has a normal mood and affect.  Nursing note and vitals reviewed.   BP 120/82 mmHg  Pulse 85  Temp(Src) 98 F (36.7 C) (Oral)  Ht 5\' 6"  (1.676 m)  Wt 161 lb 8 oz (73.256 kg)  BMI 26.08 kg/m2  SpO2 95% Wt Readings from Last 3 Encounters:  09/19/15 161 lb 8 oz (73.256 kg)  03/15/15 160 lb 6 oz (72.746 kg)  11/07/14 165 lb (74.844 kg)     Lab Results  Component Value Date   WBC 5.7 03/15/2015   HGB 15.6 03/15/2015   HCT 46.3 03/15/2015   PLT 212.0 03/15/2015   GLUCOSE 77 03/15/2015   CHOL 247* 03/15/2015   TRIG 59.0 03/15/2015   HDL 60.20 03/15/2015   LDLCALC 175* 03/15/2015   ALT 24 03/15/2015   AST 25 03/15/2015   NA 144 03/15/2015   K 3.8 03/15/2015   CL 108 03/15/2015   CREATININE 1.22 03/15/2015   BUN 18 03/15/2015   CO2 31 03/15/2015   TSH 1.14 03/15/2015   HGBA1C 6.0 03/15/2015    Lab Results  Component Value Date   TSH 1.14 03/15/2015   Lab Results  Component Value Date   WBC 5.7 03/15/2015   HGB 15.6 03/15/2015   HCT 46.3 03/15/2015   MCV 85.6 03/15/2015   PLT 212.0 03/15/2015   Lab Results  Component Value Date   NA 144 03/15/2015   K 3.8 03/15/2015   CO2 31 03/15/2015   GLUCOSE 77 03/15/2015   BUN 18 03/15/2015   CREATININE 1.22 03/15/2015   BILITOT 0.6 03/15/2015   ALKPHOS 45 03/15/2015   AST 25 03/15/2015   ALT 24 03/15/2015   PROT 7.5 03/15/2015   ALBUMIN 4.4 03/15/2015   CALCIUM 10.3 03/15/2015   GFR 79.08 03/15/2015   Lab Results  Component Value Date   CHOL 247* 03/15/2015   Lab Results  Component Value Date   HDL 60.20  03/15/2015   Lab Results  Component Value Date   LDLCALC 175* 03/15/2015   Lab Results  Component Value Date   TRIG 59.0 03/15/2015   Lab Results  Component Value Date   CHOLHDL 4 03/15/2015   Lab Results  Component Value Date   HGBA1C 6.0 03/15/2015       Assessment & Plan:   Problem List Items Addressed This Visit  None      I am having Mr. Ambrocio maintain his Garlic, multivitamin, aspirin, fish oil-omega-3 fatty acids, Red Yeast Rice, glucosamine-chondroitin, and hydrochlorothiazide.  Meds ordered this encounter  Medications  . hydrochlorothiazide (HYDRODIURIL) 25 MG tablet    Sig: Take 1 tablet (25 mg total) by mouth daily.    Dispense:  30 tablet    Refill:  8     Penni Homans, MD

## 2015-09-20 ENCOUNTER — Other Ambulatory Visit: Payer: BLUE CROSS/BLUE SHIELD

## 2015-09-20 DIAGNOSIS — R319 Hematuria, unspecified: Secondary | ICD-10-CM

## 2015-09-20 LAB — HEPATITIS C ANTIBODY: HCV AB: NEGATIVE

## 2015-09-20 LAB — HIV ANTIBODY (ROUTINE TESTING W REFLEX): HIV 1&2 Ab, 4th Generation: NONREACTIVE

## 2015-09-22 LAB — URINE CULTURE
Colony Count: NO GROWTH
ORGANISM ID, BACTERIA: NO GROWTH

## 2015-10-02 DIAGNOSIS — R1030 Lower abdominal pain, unspecified: Secondary | ICD-10-CM | POA: Insufficient documentation

## 2015-10-02 NOTE — Assessment & Plan Note (Signed)
Encouraged heart healthy diet, increase exercise, avoid trans fats, consider a krill oil cap daily 

## 2015-10-02 NOTE — Assessment & Plan Note (Signed)
C/o some discomfort in right groin, ultrasound shows a small varicocele no concerning lesions, patient will report worsening symptoms

## 2015-10-02 NOTE — Assessment & Plan Note (Signed)
minimize simple carbs. Increase exercise as tolerated.  

## 2015-10-02 NOTE — Assessment & Plan Note (Signed)
Well controlled, no changes to meds. Encouraged heart healthy diet such as the DASH diet and exercise as tolerated.  °

## 2015-12-05 ENCOUNTER — Other Ambulatory Visit: Payer: BLUE CROSS/BLUE SHIELD

## 2015-12-12 ENCOUNTER — Other Ambulatory Visit: Payer: BLUE CROSS/BLUE SHIELD

## 2015-12-12 ENCOUNTER — Other Ambulatory Visit (INDEPENDENT_AMBULATORY_CARE_PROVIDER_SITE_OTHER): Payer: BLUE CROSS/BLUE SHIELD

## 2015-12-12 DIAGNOSIS — R1030 Lower abdominal pain, unspecified: Secondary | ICD-10-CM

## 2015-12-12 DIAGNOSIS — R8290 Unspecified abnormal findings in urine: Secondary | ICD-10-CM

## 2015-12-12 LAB — URINALYSIS, ROUTINE W REFLEX MICROSCOPIC
Bilirubin Urine: NEGATIVE
Ketones, ur: NEGATIVE
Leukocytes, UA: NEGATIVE
NITRITE: NEGATIVE
PH: 6 (ref 5.0–8.0)
TOTAL PROTEIN, URINE-UPE24: NEGATIVE
URINE GLUCOSE: NEGATIVE
Urobilinogen, UA: 0.2 (ref 0.0–1.0)

## 2015-12-12 NOTE — Addendum Note (Signed)
Addended by: Peggyann Shoals on: 12/12/2015 03:40 PM   Modules accepted: Orders

## 2015-12-14 LAB — URINE CULTURE
Colony Count: NO GROWTH
Organism ID, Bacteria: NO GROWTH

## 2015-12-15 ENCOUNTER — Ambulatory Visit (INDEPENDENT_AMBULATORY_CARE_PROVIDER_SITE_OTHER): Payer: BLUE CROSS/BLUE SHIELD | Admitting: Family Medicine

## 2015-12-15 ENCOUNTER — Encounter: Payer: Self-pay | Admitting: Family Medicine

## 2015-12-15 VITALS — BP 132/80 | HR 83 | Temp 98.4°F | Ht 66.0 in | Wt 164.0 lb

## 2015-12-15 DIAGNOSIS — M79651 Pain in right thigh: Secondary | ICD-10-CM

## 2015-12-15 DIAGNOSIS — R739 Hyperglycemia, unspecified: Secondary | ICD-10-CM | POA: Diagnosis not present

## 2015-12-15 DIAGNOSIS — R1031 Right lower quadrant pain: Secondary | ICD-10-CM

## 2015-12-15 DIAGNOSIS — G8929 Other chronic pain: Secondary | ICD-10-CM

## 2015-12-15 DIAGNOSIS — K5900 Constipation, unspecified: Secondary | ICD-10-CM

## 2015-12-15 DIAGNOSIS — I10 Essential (primary) hypertension: Secondary | ICD-10-CM

## 2015-12-15 MED ORDER — HYDROCHLOROTHIAZIDE 25 MG PO TABS: 25.0000 mg | ORAL_TABLET | Freq: Every day | ORAL | Status: DC

## 2015-12-15 NOTE — Patient Instructions (Signed)
Salon pas gel twice daiily when pain flares Pelvic Pain, Male Pelvic pain is pain below the belly button and located between your hips in the groin. Acute pain may last a few hours or days. Chronic pelvic pain may last weeks and months. The cause may be different for different types of pain. The pain may be dull or sharp, mild or severe and can interfere with your daily activities. CAUSES  Some common causes of male pelvic pain include:  A sexually transmitted disease (STD).  Inflammation of the prostate (prostatitis).  A bladder infection.  Intestinal problems, such as constipation, irritable bowel syndrome, colitis, or ileitis.  Kidney stones.  Stress or depression.  Muscle spasms or pain in the pelvic floor muscles.  Musculoskeletal problems of the back.  A hernia.  Inflammation of the epididymis, which sits on top of the testicle (epididymitis).  Cancer. EVALUATION  Your caregiver will want to take a careful history of your concerns. This includes recent changes in your health and a sexual history. Obtaining your family history and medical history is also important. In order to identify the cause of the pelvic pain and to be properly treated, your caregiver will perform a physical exam and may order tests. These tests may include:   Ultrasound of the prostate or scrotum.  CT scan or MRI of the pelvis or lower spine.  A urinalysis or evaluation of discharge from the penis.  Blood tests. HOME CARE INSTRUCTIONS   Only take over-the-counter or prescription medicines as directed by your caregiver.  Rest as directed by your caregiver.  Eat a balanced diet.  Drink enough fluids to make your urine clear or pale yellow, or as directed.  Avoid sexual intercourse if it causes pain.  Apply warm or cold compresses to the lower abdomen depending on which one helps the pain.  Avoid stressful situations.  Keep a journal of your pelvic pain. Write down when it started, where  the pain is located, and if there are things that seem to be associated with the pain, such as food or certain activities.  Follow up with your caregiver as directed. SEEK MEDICAL CARE IF: Your medicine does not help your pain. SEEK IMMEDIATE MEDICAL CARE IF:  Your pelvic pain increases.  You feel lightheaded or faint.  You have chills.  You have a fever or persistent symptoms for more than 3 days.  You have a fever and your symptoms suddenly get worse.  You have pain with urination or blood in your urine.  You have uncontrolled diarrhea or vomiting. MAKE SURE YOU:   Understand these instructions.  Will watch your condition.  Will get help if you are not doing well or get worse.   This information is not intended to replace advice given to you by your health care provider. Make sure you discuss any questions you have with your health care provider.   Document Released: 08/06/2012 Document Revised: 12/03/2014 Document Reviewed: 08/06/2012 Elsevier Interactive Patient Education 2016 Else[employer] Hip Pain Your hip is the joint between your upper legs and your lower pelvis. The bones, cartilage, tendons, and muscles of your hip joint perform a lot of work each day supporting your body weight and allowing you to move around. Hip pain can range from a minor ache to severe pain in one or both of your hips. Pain may be felt on the inside of the hip joint near the groin, or the outside near the buttocks and upper thigh. You may have swelling  or stiffness as well.  HOME CARE INSTRUCTIONS   Take medicines only as directed by your health care provider.  Apply ice to the injured area:  Put ice in a plastic bag.  Place a towel between your skin and the bag.  Leave the ice on for 15-20 minutes at a time, 3-4 times a day.  Keep your leg raised (elevated) when possible to lessen swelling.  Avoid activities that cause pain.  Follow specific exercises as directed by your health care  provider.  Sleep with a pillow between your legs on your most comfortable side.  Record how often you have hip pain, the location of the pain, and what it feels like. SEEK MEDICAL CARE IF:   You are unable to put weight on your leg.  Your hip is red or swollen or very tender to touch.  Your pain or swelling continues or worsens after 1 week.  You have increasing difficulty walking.  You have a fever. SEEK IMMEDIATE MEDICAL CARE IF:   You have fallen.  You have a sudden increase in pain and swelling in your hip. MAKE SURE YOU:   Understand these instructions.  Will watch your condition.  Will get help right away if you are not doing well or get worse.   This information is not intended to replace advice given to you by your health care provider. Make sure you discuss any questions you have with your health care provider.   Document Released: 05/02/2010 Document Revised: 12/03/2014 Document Reviewed: 07/09/2013 Elsevier Interactive Patient Education [employer].

## 2015-12-15 NOTE — Progress Notes (Signed)
Pre visit review using our clinic review tool, if applicable. No additional management support is needed unless otherwise documented below in the visit note. 

## 2015-12-23 ENCOUNTER — Encounter (HOSPITAL_BASED_OUTPATIENT_CLINIC_OR_DEPARTMENT_OTHER): Payer: Self-pay

## 2015-12-23 ENCOUNTER — Ambulatory Visit (HOSPITAL_BASED_OUTPATIENT_CLINIC_OR_DEPARTMENT_OTHER)
Admission: RE | Admit: 2015-12-23 | Discharge: 2015-12-23 | Disposition: A | Discharge status: Home / Self Care | Payer: BLUE CROSS/BLUE SHIELD | Source: Ambulatory Visit | Attending: Family Medicine | Admitting: Family Medicine

## 2015-12-23 DIAGNOSIS — K429 Umbilical hernia without obstruction or gangrene: Secondary | ICD-10-CM | POA: Insufficient documentation

## 2015-12-23 DIAGNOSIS — R1031 Right lower quadrant pain: Secondary | ICD-10-CM | POA: Insufficient documentation

## 2015-12-23 DIAGNOSIS — G8929 Other chronic pain: Secondary | ICD-10-CM

## 2015-12-23 DIAGNOSIS — Q6102 Congenital multiple renal cysts: Secondary | ICD-10-CM | POA: Insufficient documentation

## 2015-12-23 MED ORDER — IOHEXOL 300 MG/ML  SOLN
100.0000 mL | Freq: Once | INTRAMUSCULAR | Status: AC | PRN
  Administered 2015-12-23: 100 mL via INTRAVENOUS

## 2015-12-25 ENCOUNTER — Encounter: Payer: Self-pay | Admitting: Family Medicine

## 2015-12-25 DIAGNOSIS — G8929 Other chronic pain: Secondary | ICD-10-CM | POA: Insufficient documentation

## 2015-12-25 DIAGNOSIS — R1031 Right lower quadrant pain: Principal | ICD-10-CM

## 2015-12-25 DIAGNOSIS — K5900 Constipation, unspecified: Secondary | ICD-10-CM | POA: Insufficient documentation

## 2015-12-25 HISTORY — DX: Constipation, unspecified: K59.00

## 2015-12-25 NOTE — Assessment & Plan Note (Signed)
minimize simple carbs. Increase exercise as tolerated.  

## 2015-12-25 NOTE — Assessment & Plan Note (Signed)
Well controlled, no changes to meds. Encouraged heart healthy diet such as the DASH diet and exercise as tolerated.  °

## 2015-12-25 NOTE — Progress Notes (Signed)
Patient ID: Antonie Fausnaugh, male   DOB: 10-28-1961, 55 y.o.   MRN: [id number]   Subjective:    Patient ID: Armen Lust, male    DOB: March 24, 1961, 55 y.o.   MRN: [id number]  Chief Complaint  Patient presents with  . Flank Pain    HPI Patient is in today for follow-up. He has been struggling with intermittent mild right-sided abdominal pain off and on for several months. He describes it as a mild pain sometimes in the right lower quadrant and sometimes radiating into his right upper thigh. He does not correlate it with bowel movements, movement or activity. He denies any anorexia or change in bowel habits. He denies any change in urinary habits. No fevers or chills. Denies CP/palp/SOB/HA/congestion/fevers/GI or GU c/o. Taking meds as prescribed Past Medical History  Diagnosis Date  . Dysrhythmia     sinus arrythmia-2006-work up negative findings  . Hyperlipemia   . Hypertension   . Allergy   . Hyperglycemia 03/15/2015  . Preventative health care 03/20/2015    Sees Dr Collene Mares of gastroenterology  . Constipation 12/25/2015    Past Surgical History  Procedure Laterality Date  . Ventral hernia repair    . Knee arthroscopy  2009    rt knee  . Knee arthroscopy  01/16/2012    Procedure: ARTHROSCOPY KNEE;  Surgeon: Gearlean Alf, MD;  Location: [facility];  Service: Orthopedics;  Laterality: Left;  WITH DEBRIDEMENT    Family History  Problem Relation Age of Onset  . Hypertension Mother   . Diabetes Mother     labile  . Kidney disease Mother   . Peripheral vascular disease Mother   . Diabetes Maternal Aunt   . Heart disease Maternal Aunt   . Heart disease Father     MI?  . Drug abuse Son     Social History   Social History  . Marital Status: Married    Spouse Name: Delora  . Number of Children: N/A  . Years of Education: 12   Occupational History  . 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 Smoking status: Never Smoker   . Smokeless  tobacco: Not on file  . Alcohol Use: No  . Drug Use: No  . Sexual Activity: Yes     Comment: lives with wife, no major dietary restrictions, works for [employer]   Other Topics Concern  . Not on file   Social History Narrative    Outpatient Prescriptions Prior to Visit  Medication Sig Dispense Refill  . aspirin 81 MG tablet Take 81 mg by mouth daily.    . fish oil-omega-3 fatty acids 1000 MG capsule Take 2 g by mouth daily.    . Garlic 10 MG CAPS Take by mouth.    Marland Kitchen glucosamine-chondroitin 500-400 MG tablet Take 1 tablet by mouth 3 (three) times daily.    . Multiple Vitamin (MULTIVITAMIN) tablet Take 1 tablet by mouth daily.    . Red Yeast Rice 600 MG CAPS Take 1 tablet by mouth daily.    . hydrochlorothiazide (HYDRODIURIL) 25 MG tablet Take 1 tablet (25 mg total) by mouth daily. 30 tablet 8   No facility-administered medications prior to visit.    No Known Allergies  Review of Systems  Constitutional: Negative for fever and malaise/fatigue.  HENT: Negative for congestion.   Eyes: Negative for discharge.  Respiratory: Negative for shortness of breath.   Cardiovascular: Negative for chest pain, palpitations and leg swelling.  Gastrointestinal: Positive for abdominal pain. Negative for nausea.  Genitourinary: Negative for dysuria.  Musculoskeletal: Negative for falls.  Skin: Negative for rash.  Neurological: Negative for loss of consciousness and headaches.  Endo/Heme/Allergies: Negative for environmental allergies.  Psychiatric/Behavioral: Negative for depression. The patient is not nervous/anxious.        Objective:    Physical Exam  Constitutional: He is oriented to person, place, and time. He appears well-developed and well-nourished. No distress.  HENT:  Head: Normocephalic and atraumatic.  Nose: Nose normal.  Eyes: Right eye exhibits no discharge. Left eye exhibits no discharge.  Neck: Normal range of motion. Neck supple.  Cardiovascular: Normal rate and regular  rhythm.   No murmur heard. Pulmonary/Chest: Effort normal and breath sounds normal.  Abdominal: Soft. Bowel sounds are normal. There is no tenderness.  Musculoskeletal: He exhibits no edema.  Neurological: He is alert and oriented to person, place, and time.  Skin: Skin is warm and dry.  Psychiatric: He has a normal mood and affect.  Nursing note and vitals reviewed.   BP 132/80 mmHg  Pulse 83  Temp(Src) 98.4 F (36.9 C) (Oral)  Ht 5\' 6"  (1.676 m)  Wt 164 lb (74.39 kg)  BMI 26.48 kg/m2  SpO2 95% Wt Readings from Last 3 Encounters:  12/15/15 164 lb (74.39 kg)  09/19/15 161 lb 8 oz (73.256 kg)  03/15/15 160 lb 6 oz (72.746 kg)     Lab Results  Component Value Date   WBC 4.1 09/19/2015   HGB 15.7 09/19/2015   HCT 47.1 09/19/2015   PLT 205.0 09/19/2015   GLUCOSE 106* 09/19/2015   CHOL 239* 09/19/2015   TRIG 63.0 09/19/2015   HDL 62.50 09/19/2015   LDLCALC 164* 09/19/2015   ALT 21 09/19/2015   AST 21 09/19/2015   NA 141 09/19/2015   K 3.7 09/19/2015   CL 102 09/19/2015   CREATININE 1.08 09/19/2015   BUN 19 09/19/2015   CO2 31 09/19/2015   TSH 1.54 09/19/2015   PSA 2.78 09/19/2015   HGBA1C 6.0 03/15/2015    Lab Results  Component Value Date   TSH 1.54 09/19/2015   Lab Results  Component Value Date   WBC 4.1 09/19/2015   HGB 15.7 09/19/2015   HCT 47.1 09/19/2015   MCV 86.8 09/19/2015   PLT 205.0 09/19/2015   Lab Results  Component Value Date   NA 141 09/19/2015   K 3.7 09/19/2015   CO2 31 09/19/2015   GLUCOSE 106* 09/19/2015   BUN 19 09/19/2015   CREATININE 1.08 09/19/2015   BILITOT 0.7 09/19/2015   ALKPHOS 42 09/19/2015   AST 21 09/19/2015   ALT 21 09/19/2015   PROT 6.9 09/19/2015   ALBUMIN 4.2 09/19/2015   CALCIUM 9.7 09/19/2015   GFR 90.85 09/19/2015   Lab Results  Component Value Date   CHOL 239* 09/19/2015   Lab Results  Component Value Date   HDL 62.50 09/19/2015   Lab Results  Component Value Date   LDLCALC 164* 09/19/2015    Lab Results  Component Value Date   TRIG 63.0 09/19/2015   Lab Results  Component Value Date   CHOLHDL 4 09/19/2015   Lab Results  Component Value Date   HGBA1C 6.0 03/15/2015       Assessment & Plan:   Problem List Items Addressed This Visit    Abdominal pain, chronic, right lower quadrant - Primary    With some mild hematuria and CT scan shows a renal cyst and a  thickening in the bladder, will refer to urology for furhter consideration      Relevant Orders   CT Abdomen Pelvis W Contrast (Completed)   Ambulatory referral to Physical Therapy   Constipation    Encouraged increased hydration and fiber in diet. Daily probiotics. If bowels not moving can use MOM 2 tbls po in 4 oz of warm prune juice by mouth every 2-3 days. If no results then repeat in 4 hours with  Dulcolax suppository pr, may repeat again in 4 more hours as needed. Seek care if symptoms worsen. Consider daily Miralax and/or Dulcolax if symptoms persist. Stool burden seen on CT scan      Hyperglycemia     minimize simple carbs. Increase exercise as tolerated.       Hypertension    Well controlled, no changes to meds. Encouraged heart healthy diet such as the DASH diet and exercise as tolerated.       Relevant Medications   hydrochlorothiazide (HYDRODIURIL) 25 MG tablet    Other Visit Diagnoses    Right thigh pain        Relevant Orders    Ambulatory referral to Physical Therapy       I am having Mr. Parola maintain his Garlic, multivitamin, aspirin, fish oil-omega-3 fatty acids, Red Yeast Rice, glucosamine-chondroitin, and hydrochlorothiazide.  Meds ordered this encounter  Medications  . hydrochlorothiazide (HYDRODIURIL) 25 MG tablet    Sig: Take 1 tablet (25 mg total) by mouth daily.    Dispense:  30 tablet    Refill:  8     Penni Homans, MD

## 2015-12-25 NOTE — Assessment & Plan Note (Signed)
With some mild hematuria and CT scan shows a renal cyst and a thickening in the bladder, will refer to urology for furhter consideration

## 2015-12-25 NOTE — Assessment & Plan Note (Signed)
Encouraged increased hydration and fiber in diet. Daily probiotics. If bowels not moving can use MOM 2 tbls po in 4 oz of warm prune juice by mouth every 2-3 days. If no results then repeat in 4 hours with  Dulcolax suppository pr, may repeat again in 4 more hours as needed. Seek care if symptoms worsen. Consider daily Miralax and/or Dulcolax if symptoms persist. Stool burden seen on CT scan

## 2015-12-27 ENCOUNTER — Telehealth: Payer: Self-pay | Admitting: Family Medicine

## 2015-12-27 ENCOUNTER — Other Ambulatory Visit: Payer: Self-pay | Admitting: Family Medicine

## 2015-12-27 DIAGNOSIS — N3289 Other specified disorders of bladder: Secondary | ICD-10-CM

## 2015-12-27 DIAGNOSIS — G8929 Other chronic pain: Secondary | ICD-10-CM

## 2015-12-27 DIAGNOSIS — R319 Hematuria, unspecified: Secondary | ICD-10-CM

## 2015-12-27 DIAGNOSIS — N281 Cyst of kidney, acquired: Secondary | ICD-10-CM

## 2015-12-27 DIAGNOSIS — R1031 Right lower quadrant pain: Principal | ICD-10-CM

## 2015-12-27 NOTE — Telephone Encounter (Signed)
Caller name: Tegh Relationship to patient:self Can be reached:[phone] Pharmacy:  Reason for call:please call the patient  He states he is returning your call

## 2016-04-02 ENCOUNTER — Encounter: Payer: BLUE CROSS/BLUE SHIELD | Admitting: Family Medicine

## 2016-04-09 ENCOUNTER — Telehealth: Payer: Self-pay | Admitting: Behavioral Health

## 2016-04-09 ENCOUNTER — Encounter: Payer: Self-pay | Admitting: Behavioral Health

## 2016-04-09 NOTE — Telephone Encounter (Signed)
Pre-Visit Call completed with patient and chart updated.   Pre-Visit Info documented in Specialty Comments under SnapShot.    

## 2016-04-10 ENCOUNTER — Encounter: Payer: Self-pay | Admitting: Family Medicine

## 2016-04-10 ENCOUNTER — Ambulatory Visit (INDEPENDENT_AMBULATORY_CARE_PROVIDER_SITE_OTHER): Payer: BLUE CROSS/BLUE SHIELD | Admitting: Family Medicine

## 2016-04-10 VITALS — BP 120/82 | HR 71 | Temp 98.2°F | Ht 66.0 in | Wt 164.1 lb

## 2016-04-10 DIAGNOSIS — Z0000 Encounter for general adult medical examination without abnormal findings: Secondary | ICD-10-CM

## 2016-04-10 DIAGNOSIS — K5900 Constipation, unspecified: Secondary | ICD-10-CM

## 2016-04-10 DIAGNOSIS — Z23 Encounter for immunization: Secondary | ICD-10-CM | POA: Diagnosis not present

## 2016-04-10 DIAGNOSIS — I10 Essential (primary) hypertension: Secondary | ICD-10-CM

## 2016-04-10 DIAGNOSIS — Z0001 Encounter for general adult medical examination with abnormal findings: Secondary | ICD-10-CM | POA: Diagnosis not present

## 2016-04-10 DIAGNOSIS — E785 Hyperlipidemia, unspecified: Secondary | ICD-10-CM | POA: Diagnosis not present

## 2016-04-10 DIAGNOSIS — R739 Hyperglycemia, unspecified: Secondary | ICD-10-CM | POA: Diagnosis not present

## 2016-04-10 LAB — LIPID PANEL
CHOL/HDL RATIO: 5
Cholesterol: 240 mg/dL — ABNORMAL HIGH (ref 0–200)
HDL: 53.1 mg/dL (ref >39.00)
LDL Cholesterol: 173 mg/dL — ABNORMAL HIGH (ref 0–99)
NONHDL: 186.95
Triglycerides: 71 mg/dL (ref 0.0–149.0)
VLDL: 14.2 mg/dL (ref 0.0–40.0)

## 2016-04-10 LAB — COMPREHENSIVE METABOLIC PANEL
ALK PHOS: 47 U/L (ref 39–117)
ALT: 21 U/L (ref 0–53)
AST: 20 U/L (ref 0–37)
Albumin: 4.2 g/dL (ref 3.5–5.2)
BUN: 21 mg/dL (ref 6–23)
CHLORIDE: 102 meq/L (ref 96–112)
CO2: 31 meq/L (ref 19–32)
Calcium: 9.9 mg/dL (ref 8.4–10.5)
Creatinine, Ser: 1.08 mg/dL (ref 0.40–1.50)
GFR: 90.67 mL/min (ref >60.00)
GLUCOSE: 100 mg/dL — AB (ref 70–99)
POTASSIUM: 3.7 meq/L (ref 3.5–5.1)
SODIUM: 141 meq/L (ref 135–145)
TOTAL PROTEIN: 7.3 g/dL (ref 6.0–8.3)
Total Bilirubin: 0.4 mg/dL (ref 0.2–1.2)

## 2016-04-10 LAB — CBC
HEMATOCRIT: 45.9 % (ref 39.0–52.0)
HEMOGLOBIN: 15.5 g/dL (ref 13.0–17.0)
MCHC: 33.8 g/dL (ref 30.0–36.0)
MCV: 85.8 fl (ref 78.0–100.0)
PLATELETS: 226 10*3/uL (ref 150.0–400.0)
RBC: 5.35 Mil/uL (ref 4.22–5.81)
RDW: 14.4 % (ref 11.5–15.5)
WBC: 4.8 10*3/uL (ref 4.0–10.5)

## 2016-04-10 LAB — TSH: TSH: 0.94 u[IU]/mL (ref 0.35–4.50)

## 2016-04-10 LAB — HEMOGLOBIN A1C: Hgb A1c MFr Bld: 6 % (ref 4.6–6.5)

## 2016-04-10 NOTE — Progress Notes (Signed)
Subjective:    [name], male    DOB: Aug 15, 1961, 55 y.o.   MRN: [id number]  Chief Complaint  Patient presents with  . Annual Exam    HPI Patient is in today for Annual Exam.  Patient reports having a cyst on left renals of kidneys and is following up with [facility] for this matter.   Patient reports he is exercising regulary but still has some occasionaly mild right lower quadrant pain has been managing this on his own. Patient reports some warm sensation on right middle finger that comes and goes and last about a minute. Denies CP/palp/SOB/HA/congestion/fevers/GI or GU c/o. Taking meds as prescribed.   Past Medical History  Diagnosis Date  . Dysrhythmia     sinus arrythmia-2006-work up negative findings  . Hyperlipemia   . Hypertension   . Allergy   . Hyperglycemia 03/15/2015  . Preventative health care 03/20/2015    Sees Dr Collene Mares of gastroenterology  . Constipation 12/25/2015    Past Surgical History  Procedure Laterality Date  . Ventral hernia repair    . Knee arthroscopy  2009    rt knee  . Knee arthroscopy  01/16/2012    Procedure: ARTHROSCOPY KNEE;  Surgeon: Gearlean Alf, MD;  Location: [facility];  Service: Orthopedics;  Laterality: Left;  WITH DEBRIDEMENT    Family History  Problem Relation Age of Onset  . Hypertension Mother   . Diabetes Mother     labile  . Kidney disease Mother   . Peripheral vascular disease Mother   . Diabetes Maternal Aunt   . Heart disease Maternal Aunt   . Heart disease Father     MI?  . Drug abuse Son     Social History   Social History  . Marital Status: Married    Spouse Name: Delora  . Number of Children: N/A  . Years of Education: 12   Occupational History  . 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 Smoking status: Never Smoker   . Smokeless tobacco: Not on file  . Alcohol Use: No  . Drug Use: No  . Sexual Activity: Yes     Comment: lives with wife, no  major dietary restrictions, works for [employer]   Other Topics Concern  . Not on file   Social History Narrative    Outpatient Prescriptions Prior to Visit  Medication Sig Dispense Refill  . aspirin 81 MG tablet Take 81 mg by mouth daily.    . fish oil-omega-3 fatty acids 1000 MG capsule Take 2 g by mouth daily.    . Garlic 10 MG CAPS Take by mouth.    . hydrochlorothiazide (HYDRODIURIL) 25 MG tablet Take 1 tablet (25 mg total) by mouth daily. 30 tablet 8  . Multiple Vitamin (MULTIVITAMIN) tablet Take 1 tablet by mouth daily.    . Red Yeast Rice 600 MG CAPS Take 1 tablet by mouth daily.    . vitamin E 600 UNIT capsule Take 600 Units by mouth daily.    Marland Kitchen glucosamine-chondroitin 500-400 MG tablet Take 1 tablet by mouth 3 (three) times daily. Reported on 04/10/2016     No facility-administered medications prior to visit.    No Known Allergies  Review of Systems  Constitutional: Negative for fever and malaise/fatigue.  HENT: Negative for congestion.   Eyes: Negative for blurred vision.  Respiratory: Negative for shortness of breath.   Cardiovascular: Negative for chest pain, palpitations and  leg swelling.  Gastrointestinal: Negative for nausea, abdominal pain and blood in stool.  Genitourinary: Negative for dysuria and frequency.  Musculoskeletal: Negative for falls.  Skin: Negative for rash.  Neurological: Negative for dizziness, loss of consciousness and headaches.  Endo/Heme/Allergies: Negative for environmental allergies.  Psychiatric/Behavioral: Negative for depression. The patient is not nervous/anxious.        Objective:    Physical Exam  Constitutional: He is oriented to person, place, and time. He appears well-developed and well-nourished. No distress.  HENT:  Head: Normocephalic and atraumatic.  Eyes: Conjunctivae are normal.  Neck: Neck supple. No thyromegaly present.  Cardiovascular: Normal rate, regular rhythm and normal heart sounds.   No murmur  heard. Pulmonary/Chest: Effort normal and breath sounds normal. No respiratory distress. He has no wheezes.  Abdominal: Soft. Bowel sounds are normal. He exhibits no mass. There is no tenderness.  Musculoskeletal: He exhibits no edema.  Lymphadenopathy:    He has no cervical adenopathy.  Neurological: He is alert and oriented to person, place, and time.  Skin: Skin is warm and dry.  Psychiatric: He has a normal mood and affect. His behavior is normal.    BP 120/82 mmHg  Pulse 71  Temp(Src) 98.2 F (36.8 C) (Oral)  Ht 5\' 6"  (1.676 m)  Wt 164 lb 2 oz (74.447 kg)  BMI 26.50 kg/m2  SpO2 94% Wt Readings from Last 3 Encounters:  04/10/16 164 lb 2 oz (74.447 kg)  12/15/15 164 lb (74.39 kg)  09/19/15 161 lb 8 oz (73.256 kg)     Lab Results  Component Value Date   WBC 4.1 09/19/2015   HGB 15.7 09/19/2015   HCT 47.1 09/19/2015   PLT 205.0 09/19/2015   GLUCOSE 106* 09/19/2015   CHOL 239* 09/19/2015   TRIG 63.0 09/19/2015   HDL 62.50 09/19/2015   LDLCALC 164* 09/19/2015   ALT 21 09/19/2015   AST 21 09/19/2015   NA 141 09/19/2015   K 3.7 09/19/2015   CL 102 09/19/2015   CREATININE 1.08 09/19/2015   BUN 19 09/19/2015   CO2 31 09/19/2015   TSH 1.54 09/19/2015   PSA 2.78 09/19/2015   HGBA1C 6.0 03/15/2015    Lab Results  Component Value Date   TSH 1.54 09/19/2015   Lab Results  Component Value Date   WBC 4.1 09/19/2015   HGB 15.7 09/19/2015   HCT 47.1 09/19/2015   MCV 86.8 09/19/2015   PLT 205.0 09/19/2015   Lab Results  Component Value Date   NA 141 09/19/2015   K 3.7 09/19/2015   CO2 31 09/19/2015   GLUCOSE 106* 09/19/2015   BUN 19 09/19/2015   CREATININE 1.08 09/19/2015   BILITOT 0.7 09/19/2015   ALKPHOS 42 09/19/2015   AST 21 09/19/2015   ALT 21 09/19/2015   PROT 6.9 09/19/2015   ALBUMIN 4.2 09/19/2015   CALCIUM 9.7 09/19/2015   GFR 90.85 09/19/2015   Lab Results  Component Value Date   CHOL 239* 09/19/2015   Lab Results  Component Value Date    HDL 62.50 09/19/2015   Lab Results  Component Value Date   LDLCALC 164* 09/19/2015   Lab Results  Component Value Date   TRIG 63.0 09/19/2015   Lab Results  Component Value Date   CHOLHDL 4 09/19/2015   Lab Results  Component Value Date   HGBA1C 6.0 03/15/2015       Assessment & Plan:   Problem List Items Addressed This Visit    Preventative health care  Patient encouraged to maintain heart healthy diet, regular exercise, adequate sleep. Consider daily probiotics. Take medications as prescribed. Given and reviewed copy of ACP documents from [employer] and encouraged to complete and return. Given Tdap today      Hypertension   Relevant Orders   CBC   TSH   Lipid panel   Comprehensive metabolic panel   Hemoglobin A1c   CBC   TSH   Lipid panel   Comprehensive metabolic panel   Hemoglobin A1c   Hyperlipemia    Encouraged heart healthy diet, increase exercise, avoid trans fats, consider a krill oil cap daily      Relevant Orders   CBC   TSH   Lipid panel   Comprehensive metabolic panel   Hemoglobin A1c   CBC   TSH   Lipid panel   Comprehensive metabolic panel   Hemoglobin A1c   Hyperglycemia    hgba1c acceptable, minimize simple carbs. Increase exercise as tolerated. Continue current meds      Relevant Orders   CBC   TSH   Lipid panel   Comprehensive metabolic panel   Hemoglobin A1c   CBC   TSH   Lipid panel   Comprehensive metabolic panel   Hemoglobin A1c   Constipation    Encouraged increased hydration and fiber in diet. Daily probiotics. If bowels not moving can use MOM 2 tbls po in 4 oz of warm prune juice by mouth every 2-3 days. If no results then repeat in 4 hours with  Dulcolax suppository pr, may repeat again in 4 more hours as needed. Seek care if symptoms worsen. Consider daily Miralax and/or Dulcolax if symptoms persist.       Relevant Orders   CBC   TSH   Lipid panel   Comprehensive metabolic panel   Hemoglobin A1c    CBC   TSH   Lipid panel   Comprehensive metabolic panel   Hemoglobin A1c    Other Visit Diagnoses    Need for diphtheria-tetanus-pertussis (Tdap) vaccine, adult/adolescent    -  Primary    Relevant Orders    Tdap vaccine greater than or equal to 7yo IM (Completed)    CBC    TSH    Lipid panel    Comprehensive metabolic panel    Hemoglobin A1c       I have discontinued Mr. Estella glucosamine-chondroitin. I am also having him maintain his Garlic, multivitamin, aspirin, fish oil-omega-3 fatty acids, Red Yeast Rice, hydrochlorothiazide, and vitamin E.  No orders of the defined types were placed in this encounter.     Penni Homans, MD

## 2016-04-10 NOTE — Progress Notes (Signed)
Pre visit review using our clinic review tool, if applicable. No additional management support is needed unless otherwise documented below in the visit note. 

## 2016-04-10 NOTE — Assessment & Plan Note (Signed)
Encouraged increased hydration and fiber in diet. Daily probiotics. If bowels not moving can use MOM 2 tbls po in 4 oz of warm prune juice by mouth every 2-3 days. If no results then repeat in 4 hours with  Dulcolax suppository pr, may repeat again in 4 more hours as needed. Seek care if symptoms worsen. Consider daily Miralax and/or Dulcolax if symptoms persist.  

## 2016-04-10 NOTE — Assessment & Plan Note (Addendum)
Patient encouraged to maintain heart healthy diet, regular exercise, adequate sleep. Consider daily probiotics. Take medications as prescribed. Given and reviewed copy of ACP documents from [employer] and encouraged to complete and return. Given Tdap today

## 2016-04-10 NOTE — Patient Instructions (Addendum)
NOW Probiotic Vitamin. Available at [employer].  Get off elevator make right and pharmacy should be straight ahead ask for NOW probiotic recommended by Dr. Charlett Blake. Preventive Care for Adults, Male A healthy lifestyle and preventive care can promote health and wellness. Preventive health guidelines for men include the following key practices:  A routine yearly physical is a good way to check with your health care provider about your health and preventative screening. It is a chance to share any concerns and updates on your health and to receive a thorough exam.  Visit your dentist for a routine exam and preventative care every 6 months. Brush your teeth twice a day and floss once a day. Good oral hygiene prevents tooth decay and gum disease.  The frequency of eye exams is based on your age, health, family medical history, use of contact lenses, and other factors. Follow your health care provider's recommendations for frequency of eye exams.  Eat a healthy diet. Foods such as vegetables, fruits, whole grains, low-fat dairy products, and lean protein foods contain the nutrients you need without too many calories. Decrease your intake of foods high in solid fats, added sugars, and salt. Eat the right amount of calories for you.Get information about a proper diet from your health care provider, if necessary.  Regular physical exercise is one of the most important things you can do for your health. Most adults should get at least 150 minutes of moderate-intensity exercise (any activity that increases your heart rate and causes you to sweat) each week. In addition, most adults need muscle-strengthening exercises on 2 or more days a week.  Maintain a healthy weight. The body mass index (BMI) is a screening tool to identify possible weight problems. It provides an estimate of body fat based on height and weight. Your health care provider can find your BMI and can help you achieve or maintain a healthy  weight.For adults 20 years and older:  A BMI below 18.5 is considered underweight.  A BMI of 18.5 to 24.9 is normal.  A BMI of 25 to 29.9 is considered overweight.  A BMI of 30 and above is considered obese.  Maintain normal blood lipids and cholesterol levels by exercising and minimizing your intake of saturated fat. Eat a balanced diet with plenty of fruit and vegetables. Blood tests for lipids and cholesterol should begin at age 71 and be repeated every 5 years. If your lipid or cholesterol levels are high, you are over 50, or you are at high risk for heart disease, you may need your cholesterol levels checked more frequently.Ongoing high lipid and cholesterol levels should be treated with medicines if diet and exercise are not working.  If you smoke, find out from your health care provider how to quit. If you do not use tobacco, do not start.  Lung cancer screening is recommended for adults aged 39-80 years who are at high risk for developing lung cancer because of a history of smoking. A yearly low-dose CT scan of the lungs is recommended for people who have at least a 30-pack-year history of smoking and are a current smoker or have quit within the past 15 years. A pack year of smoking is smoking an average of 1 pack of cigarettes a day for 1 year (for example: 1 pack a day for 30 years or 2 packs a day for 15 years). Yearly screening should continue until the smoker has stopped smoking for at least 15 years. Yearly screening should be stopped  for people who develop a health problem that would prevent them from having lung cancer treatment.  If you choose to drink alcohol, do not have more than 2 drinks per day. One drink is considered to be 12 ounces (355 mL) of beer, 5 ounces (148 mL) of wine, or 1.5 ounces (44 mL) of liquor.  Avoid use of street drugs. Do not share needles with anyone. Ask for help if you need support or instructions about stopping the use of drugs.  High blood pressure  causes heart disease and increases the risk of stroke. Your blood pressure should be checked at least every 1-2 years. Ongoing high blood pressure should be treated with medicines, if weight loss and exercise are not effective.  If you are 66-9 years old, ask your health care provider if you should take aspirin to prevent heart disease.  Diabetes screening is done by taking a blood sample to check your blood glucose level after you have not eaten for a certain period of time (fasting). If you are not overweight and you do not have risk factors for diabetes, you should be screened once every 3 years starting at age 3. If you are overweight or obese and you are 61-66 years of age, you should be screened for diabetes every year as part of your cardiovascular risk assessment.  Colorectal cancer can be detected and often prevented. Most routine colorectal cancer screening begins at the age of 8 and continues through age 17. However, your health care provider may recommend screening at an earlier age if you have risk factors for colon cancer. On a yearly basis, your health care provider may provide home test kits to check for hidden blood in the stool. Use of a small camera at the end of a tube to directly examine the colon (sigmoidoscopy or colonoscopy) can detect the earliest forms of colorectal cancer. Talk to your health care provider about this at age 82, when routine screening begins. Direct exam of the colon should be repeated every 5-10 years through age 24, unless early forms of precancerous polyps or small growths are found.  People who are at an increased risk for hepatitis B should be screened for this virus. You are considered at high risk for hepatitis B if:  You were born in a country where hepatitis B occurs often. Talk with your health care provider about which countries are considered high risk.  Your parents were born in a high-risk country and you have not received a shot to protect  against hepatitis B (hepatitis B vaccine).  You have HIV or AIDS.  You use needles to inject street drugs.  You live with, or have sex with, someone who has hepatitis B.  You are a man who has sex with other men (MSM).  You get hemodialysis treatment.  You take certain medicines for conditions such as cancer, organ transplantation, and autoimmune conditions.  Hepatitis C blood testing is recommended for all people born from [birth year] through 1965 and any individual with known risks for hepatitis C.  Practice safe sex. Use condoms and avoid high-risk sexual practices to reduce the spread of sexually transmitted infections (STIs). STIs include gonorrhea, chlamydia, syphilis, trichomonas, herpes, HPV, and human immunodeficiency virus (HIV). Herpes, HIV, and HPV are viral illnesses that have no cure. They can result in disability, cancer, and death.  If you are a man who has sex with other men, you should be screened at least once per year for:  HIV.  Urethral,  rectal, and pharyngeal infection of gonorrhea, chlamydia, or both.  If you are at risk of being infected with HIV, it is recommended that you take a prescription medicine daily to prevent HIV infection. This is called preexposure prophylaxis (PrEP). You are considered at risk if:  You are a man who has sex with other men (MSM) and have other risk factors.  You are a heterosexual man, are sexually active, and are at increased risk for HIV infection.  You take drugs by injection.  You are sexually active with a partner who has HIV.  Talk with your health care provider about whether you are at high risk of being infected with HIV. If you choose to begin PrEP, you should first be tested for HIV. You should then be tested every 3 months for as long as you are taking PrEP.  A one-time screening for abdominal aortic aneurysm (AAA) and surgical repair of large AAAs by ultrasound are recommended for men ages 89 to 89 years who are current or  former smokers.  Healthy men should no longer receive prostate-specific antigen (PSA) blood tests as part of routine cancer screening. Talk with your health care provider about prostate cancer screening.  Testicular cancer screening is not recommended for adult males who have no symptoms. Screening includes self-exam, a health care provider exam, and other screening tests. Consult with your health care provider about any symptoms you have or any concerns you have about testicular cancer.  Use sunscreen. Apply sunscreen liberally and repeatedly throughout the day. You should seek shade when your shadow is shorter than you. Protect yourself by wearing long sleeves, pants, a wide-brimmed hat, and sunglasses year round, whenever you are outdoors.  Once a month, do a whole-body skin exam, using a mirror to look at the skin on your back. Tell your health care provider about new moles, moles that have irregular borders, moles that are larger than a pencil eraser, or moles that have changed in shape or color.  Stay current with required vaccines (immunizations).  Influenza vaccine. All adults should be immunized every year.  Tetanus, diphtheria, and acellular pertussis (Td, Tdap) vaccine. An adult who has not previously received Tdap or who does not know his vaccine status should receive 1 dose of Tdap. This initial dose should be followed by tetanus and diphtheria toxoids (Td) booster doses every 10 years. Adults with an unknown or incomplete history of completing a 3-dose immunization series with Td-containing vaccines should begin or complete a primary immunization series including a Tdap dose. Adults should receive a Td booster every 10 years.  Varicella vaccine. An adult without evidence of immunity to varicella should receive 2 doses or a second dose if he has previously received 1 dose.  Human papillomavirus (HPV) vaccine. Males aged 11-21 years who have not received the vaccine previously should  receive the 3-dose series. Males aged 22-26 years may be immunized. Immunization is recommended through the age of 55 years for any male who has sex with males and did not get any or all doses earlier. Immunization is recommended for any person with an immunocompromised condition through the age of 90 years if he did not get any or all doses earlier. During the 3-dose series, the second dose should be obtained 4-8 weeks after the first dose. The third dose should be obtained 24 weeks after the first dose and 16 weeks after the second dose.  Zoster vaccine. One dose is recommended for adults aged 73 years or older unless  certain conditions are present.  Measles, mumps, and rubella (MMR) vaccine. Adults born before 3 generally are considered immune to measles and mumps. Adults born in [birth year] or later should have 1 or more doses of MMR vaccine unless there is a contraindication to the vaccine or there is laboratory evidence of immunity to each of the three diseases. A routine second dose of MMR vaccine should be obtained at least 28 days after the first dose for students attending postsecondary schools, health care workers, or international travelers. People who received inactivated measles vaccine or an unknown type of measles vaccine during 1963-1967 should receive 2 doses of MMR vaccine. People who received inactivated mumps vaccine or an unknown type of mumps vaccine before 1979 and are at high risk for mumps infection should consider immunization with 2 doses of MMR vaccine. Unvaccinated health care workers born before [birth year] who lack laboratory evidence of measles, mumps, or rubella immunity or laboratory confirmation of disease should consider measles and mumps immunization with 2 doses of MMR vaccine or rubella immunization with 1 dose of MMR vaccine.  Pneumococcal 13-valent conjugate (PCV13) vaccine. When indicated, a person who is uncertain of his immunization history and has no record of immunization  should receive the PCV13 vaccine. All adults 5 years of age and older should receive this vaccine. An adult aged 54 years or older who has certain medical conditions and has not been previously immunized should receive 1 dose of PCV13 vaccine. This PCV13 should be followed with a dose of pneumococcal polysaccharide (PPSV23) vaccine. Adults who are at high risk for pneumococcal disease should obtain the PPSV23 vaccine at least 8 weeks after the dose of PCV13 vaccine. Adults older than 57 years of age who have normal immune system function should obtain the PPSV23 vaccine dose at least 1 year after the dose of PCV13 vaccine.  Pneumococcal polysaccharide (PPSV23) vaccine. When PCV13 is also indicated, PCV13 should be obtained first. All adults aged 84 years and older should be immunized. An adult younger than age 62 years who has certain medical conditions should be immunized. Any person who resides in a nursing home or long-term care facility should be immunized. An adult smoker should be immunized. People with an immunocompromised condition and certain other conditions should receive both PCV13 and PPSV23 vaccines. People with human immunodeficiency virus (HIV) infection should be immunized as soon as possible after diagnosis. Immunization during chemotherapy or radiation therapy should be avoided. Routine use of PPSV23 vaccine is not recommended for American Indians, [place] Natives, or people younger than 65 years unless there are medical conditions that require PPSV23 vaccine. When indicated, people who have unknown immunization and have no record of immunization should receive PPSV23 vaccine. One-time revaccination 5 years after the first dose of PPSV23 is recommended for people aged 19-64 years who have chronic kidney failure, nephrotic syndrome, asplenia, or immunocompromised conditions. People who received 1-2 doses of PPSV23 before age 12 years should receive another dose of PPSV23 vaccine at age 26 years  or later if at least 5 years have passed since the previous dose. Doses of PPSV23 are not needed for people immunized with PPSV23 at or after age 74 years.  Meningococcal vaccine. Adults with asplenia or persistent complement component deficiencies should receive 2 doses of quadrivalent meningococcal conjugate (MenACWY-D) vaccine. The doses should be obtained at least 2 months apart. Microbiologists working with certain meningococcal bacteria, [employer] recruits, people at risk during an outbreak, and people who travel to or live in countries with  a high rate of meningitis should be immunized. A first-year college student up through age 25 years who is living in a residence hall should receive a dose if he did not receive a dose on or after his 16th birthday. Adults who have certain high-risk conditions should receive one or more doses of vaccine.  Hepatitis A vaccine. Adults who wish to be protected from this disease, have chronic liver disease, work with hepatitis A-infected animals, work in hepatitis A research labs, or travel to or work in countries with a high rate of hepatitis A should be immunized. Adults who were previously unvaccinated and who anticipate close contact with an international adoptee during the first 60 days after arrival in the [place] from a country with a high rate of hepatitis A should be immunized.  Hepatitis B vaccine. Adults should be immunized if they wish to be protected from this disease, are under age 85 years and have diabetes, have chronic liver disease, have had more than one sex partner in the past 6 months, may be exposed to blood or other infectious body fluids, are household contacts or sex partners of hepatitis B positive people, are clients or workers in certain care facilities, or travel to or work in countries with a high rate of hepatitis B.  Haemophilus influenzae type b (Hib) vaccine. A previously unvaccinated person with asplenia or sickle cell disease or  having a scheduled splenectomy should receive 1 dose of Hib vaccine. Regardless of previous immunization, a recipient of a hematopoietic stem cell transplant should receive a 3-dose series 6-12 months after his successful transplant. Hib vaccine is not recommended for adults with HIV infection. Preventive Service / Frequency Ages 95 to 59  Blood pressure check.** / Every 3-5 years.  Lipid and cholesterol check.** / Every 5 years beginning at age 36.  Hepatitis C blood test.** / For any individual with known risks for hepatitis C.  Skin self-exam. / Monthly.  Influenza vaccine. / Every year.  Tetanus, diphtheria, and acellular pertussis (Tdap, Td) vaccine.** / Consult your health care provider. 1 dose of Td every 10 years.  Varicella vaccine.** / Consult your health care provider.  HPV vaccine. / 3 doses over 6 months, if 81 or younger.  Measles, mumps, rubella (MMR) vaccine.** / You need at least 1 dose of MMR if you were born in 1957 or later. You may also need a second dose.  Pneumococcal 13-valent conjugate (PCV13) vaccine.** / Consult your health care provider.  Pneumococcal polysaccharide (PPSV23) vaccine.** / 1 to 2 doses if you smoke cigarettes or if you have certain conditions.  Meningococcal vaccine.** / 1 dose if you are age 33 to 37 years and a Market researcher living in a residence hall, or have one of several medical conditions. You may also need additional booster doses.  Hepatitis A vaccine.** / Consult your health care provider.  Hepatitis B vaccine.** / Consult your health care provider.  Haemophilus influenzae type b (Hib) vaccine.** / Consult your health care provider. Ages 70 to 26  Blood pressure check.** / Every year.  Lipid and cholesterol check.** / Every 5 years beginning at age 52.  Lung cancer screening. / Every year if you are aged 50-80 years and have a 30-pack-year history of smoking and currently smoke or have quit within the past 15  years. Yearly screening is stopped once you have quit smoking for at least 15 years or develop a health problem that would prevent you from having lung cancer  treatment.  Fecal occult blood test (FOBT) of stool. / Every year beginning at age 27 and continuing until age 70. You may not have to do this test if you get a colonoscopy every 10 years.  Flexible sigmoidoscopy** or colonoscopy.** / Every 5 years for a flexible sigmoidoscopy or every 10 years for a colonoscopy beginning at age 40 and continuing until age 76.  Hepatitis C blood test.** / For all people born from [birth year] through 1965 and any individual with known risks for hepatitis C.  Skin self-exam. / Monthly.  Influenza vaccine. / Every year.  Tetanus, diphtheria, and acellular pertussis (Tdap/Td) vaccine.** / Consult your health care provider. 1 dose of Td every 10 years.  Varicella vaccine.** / Consult your health care provider.  Zoster vaccine.** / 1 dose for adults aged 78 years or older.  Measles, mumps, rubella (MMR) vaccine.** / You need at least 1 dose of MMR if you were born in 1959 or later. You may also need a second dose.  Pneumococcal 13-valent conjugate (PCV13) vaccine.** / Consult your health care provider.  Pneumococcal polysaccharide (PPSV23) vaccine.** / 1 to 2 doses if you smoke cigarettes or if you have certain conditions.  Meningococcal vaccine.** / Consult your health care provider.  Hepatitis A vaccine.** / Consult your health care provider.  Hepatitis B vaccine.** / Consult your health care provider.  Haemophilus influenzae type b (Hib) vaccine.** / Consult your health care provider. Ages 4 and over  Blood pressure check.** / Every year.  Lipid and cholesterol check.**/ Every 5 years beginning at age 44.  Lung cancer screening. / Every year if you are aged 35-80 years and have a 30-pack-year history of smoking and currently smoke or have quit within the past 15 years. Yearly screening is stopped  once you have quit smoking for at least 15 years or develop a health problem that would prevent you from having lung cancer treatment.  Fecal occult blood test (FOBT) of stool. / Every year beginning at age 38 and continuing until age 64. You may not have to do this test if you get a colonoscopy every 10 years.  Flexible sigmoidoscopy** or colonoscopy.** / Every 5 years for a flexible sigmoidoscopy or every 10 years for a colonoscopy beginning at age 22 and continuing until age 76.  Hepatitis C blood test.** / For all people born from [birth year] through 1967 and any individual with known risks for hepatitis C.  Abdominal aortic aneurysm (AAA) screening.** / A one-time screening for ages 8 to 19 years who are current or former smokers.  Skin self-exam. / Monthly.  Influenza vaccine. / Every year.  Tetanus, diphtheria, and acellular pertussis (Tdap/Td) vaccine.** / 1 dose of Td every 10 years.  Varicella vaccine.** / Consult your health care provider.  Zoster vaccine.** / 1 dose for adults aged 81 years or older.  Pneumococcal 13-valent conjugate (PCV13) vaccine.** / 1 dose for all adults aged 8 years and older.  Pneumococcal polysaccharide (PPSV23) vaccine.** / 1 dose for all adults aged 48 years and older.  Meningococcal vaccine.** / Consult your health care provider.  Hepatitis A vaccine.** / Consult your health care provider.  Hepatitis B vaccine.** / Consult your health care provider.  Haemophilus influenzae type b (Hib) vaccine.** / Consult your health care provider. **Family history and personal history of risk and conditions may change your health care provider's recommendations.   This information is not intended to replace advice given to you by your health care provider. Make sure  you discuss any questions you have with your health care provider.   Document Released: 01/08/2002 Document Revised: 12/03/2014 Document Reviewed: 04/09/2011 Elsevier Interactive Patient Education  [employer].

## 2016-04-10 NOTE — Assessment & Plan Note (Signed)
Encouraged heart healthy diet, increase exercise, avoid trans fats, consider a krill oil cap daily 

## 2016-04-10 NOTE — Assessment & Plan Note (Signed)
[id number] acceptable, minimize simple carbs. Increase exercise as tolerated. Continue current meds 

## 2016-06-04 ENCOUNTER — Other Ambulatory Visit: Payer: Self-pay | Admitting: Urology

## 2016-06-04 DIAGNOSIS — N281 Cyst of kidney, acquired: Secondary | ICD-10-CM

## 2016-07-03 ENCOUNTER — Ambulatory Visit: Payer: BLUE CROSS/BLUE SHIELD | Admitting: Medical

## 2016-07-14 ENCOUNTER — Ambulatory Visit (HOSPITAL_COMMUNITY)
Admission: RE | Admit: 2016-07-14 | Discharge: 2016-07-14 | Disposition: A | Discharge status: Home / Self Care | Payer: BLUE CROSS/BLUE SHIELD | Source: Ambulatory Visit | Attending: Urology | Admitting: Urology

## 2016-07-14 DIAGNOSIS — N281 Cyst of kidney, acquired: Secondary | ICD-10-CM | POA: Diagnosis not present

## 2016-07-14 DIAGNOSIS — R935 Abnormal findings on diagnostic imaging of other abdominal regions, including retroperitoneum: Secondary | ICD-10-CM | POA: Insufficient documentation

## 2016-07-14 DIAGNOSIS — D1803 Hemangioma of intra-abdominal structures: Secondary | ICD-10-CM | POA: Insufficient documentation

## 2016-07-14 LAB — POCT I-STAT CREATININE: CREATININE: 1.2 mg/dL (ref 0.61–1.24)

## 2016-07-14 MED ORDER — GADOBENATE DIMEGLUMINE 529 MG/ML IV SOLN
15.0000 mL | Freq: Once | INTRAVENOUS | Status: AC | PRN
  Administered 2016-07-14: 15 mL via INTRAVENOUS

## 2016-10-08 ENCOUNTER — Ambulatory Visit (INDEPENDENT_AMBULATORY_CARE_PROVIDER_SITE_OTHER): Payer: BLUE CROSS/BLUE SHIELD | Admitting: Family Medicine

## 2016-10-08 ENCOUNTER — Encounter: Payer: Self-pay | Admitting: Family Medicine

## 2016-10-08 DIAGNOSIS — E782 Mixed hyperlipidemia: Secondary | ICD-10-CM | POA: Diagnosis not present

## 2016-10-08 DIAGNOSIS — I10 Essential (primary) hypertension: Secondary | ICD-10-CM

## 2016-10-08 DIAGNOSIS — R739 Hyperglycemia, unspecified: Secondary | ICD-10-CM

## 2016-10-08 LAB — CBC
HCT: 46.1 % (ref 39.0–52.0)
Hemoglobin: 15.4 g/dL (ref 13.0–17.0)
MCHC: 33.3 g/dL (ref 30.0–36.0)
MCV: 86.4 fl (ref 78.0–100.0)
Platelets: 214 10*3/uL (ref 150.0–400.0)
RBC: 5.34 Mil/uL (ref 4.22–5.81)
RDW: 14.6 % (ref 11.5–15.5)
WBC: 4.2 10*3/uL (ref 4.0–10.5)

## 2016-10-08 LAB — TSH: TSH: 1.6 u[IU]/mL (ref 0.35–4.50)

## 2016-10-08 LAB — LIPID PANEL
CHOLESTEROL: 244 mg/dL — AB (ref 0–200)
HDL: 63.3 mg/dL (ref >39.00)
LDL Cholesterol: 169 mg/dL — ABNORMAL HIGH (ref 0–99)
NonHDL: 181.02
TRIGLYCERIDES: 58 mg/dL (ref 0.0–149.0)
Total CHOL/HDL Ratio: 4
VLDL: 11.6 mg/dL (ref 0.0–40.0)

## 2016-10-08 LAB — HEMOGLOBIN A1C: HEMOGLOBIN A1C: 5.8 % (ref 4.6–6.5)

## 2016-10-08 LAB — COMPREHENSIVE METABOLIC PANEL
ALBUMIN: 4 g/dL (ref 3.5–5.2)
ALK PHOS: 46 U/L (ref 39–117)
ALT: 19 U/L (ref 0–53)
AST: 18 U/L (ref 0–37)
BILIRUBIN TOTAL: 0.4 mg/dL (ref 0.2–1.2)
BUN: 15 mg/dL (ref 6–23)
CO2: 33 mEq/L — ABNORMAL HIGH (ref 19–32)
CREATININE: 1.02 mg/dL (ref 0.40–1.50)
Calcium: 9.8 mg/dL (ref 8.4–10.5)
Chloride: 102 mEq/L (ref 96–112)
GFR: 96.68 mL/min (ref >60.00)
Glucose, Bld: 101 mg/dL — ABNORMAL HIGH (ref 70–99)
Potassium: 3.9 mEq/L (ref 3.5–5.1)
SODIUM: 142 meq/L (ref 135–145)
TOTAL PROTEIN: 7.2 g/dL (ref 6.0–8.3)

## 2016-10-08 NOTE — Assessment & Plan Note (Signed)
Well controlled, no changes to meds. Encouraged heart healthy diet such as the DASH diet and exercise as tolerated.  °

## 2016-10-08 NOTE — Progress Notes (Signed)
Pre visit review using our clinic review tool, if applicable. No additional management support is needed unless otherwise documented below in the visit note. 

## 2016-10-08 NOTE — Assessment & Plan Note (Signed)
Tolerating statin, encouraged heart healthy diet, avoid trans fats, minimize simple carbs and saturated fats. Increase exercise as tolerated 

## 2016-10-08 NOTE — Progress Notes (Signed)
[name], male   DOB: 08/09/1961, 55 y.o.   MRN: [id number]   Subjective:    [name], male    DOB: 23-May-1961, 55 y.o.   MRN: [id number]  Chief Complaint  Patient presents with  . Follow-up    HPI Patient is in today for follow up. He is feeling well today. No recent illness or hospitalizations. Does not endorse polyuria or polydipsia. Denies CP/palp/SOB/HA/congestion/fevers/GI or GU c/o. Taking meds as prescribed. 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 Is trying to exercise regularly.   Past Medical History:  Diagnosis Date  . Allergy   . Constipation 12/25/2015  . Dysrhythmia    sinus arrythmia-2006-work up negative findings  . Hyperglycemia 03/15/2015  . Hyperlipemia   . Hypertension   . Preventative health care 03/20/2015   Sees Dr Collene Mares of gastroenterology    Past Surgical History:  Procedure Laterality Date  . KNEE ARTHROSCOPY  2009   rt knee  . KNEE ARTHROSCOPY  01/16/2012   Procedure: ARTHROSCOPY KNEE;  Surgeon: Gearlean Alf, MD;  Location: [facility];  Service: Orthopedics;  Laterality: Left;  WITH DEBRIDEMENT  . VENTRAL HERNIA REPAIR      Family History  Problem Relation Age of Onset  . Hypertension Mother   . Diabetes Mother     labile  . Kidney disease Mother   . Peripheral vascular disease Mother   . Diabetes Maternal Aunt   . Heart disease Maternal Aunt   . Heart disease Father     MI?  . Drug abuse Son     Social History   Social History  . Marital status: Married    Spouse name: Delora  . Number of children: N/A  . Years of education: 48   Occupational History  . 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 Smoking status: Never Smoker  . Smokeless tobacco: Not on file  . Alcohol use No  . Drug use: No  . Sexual activity: Yes     Comment: lives with wife, no major dietary restrictions, works for [employer]   Other Topics Concern  . Not  on file   Social History Narrative  . No narrative on file    Outpatient Medications Prior to Visit  Medication Sig Dispense Refill  . aspirin 81 MG tablet Take 81 mg by mouth daily.    . fish oil-omega-3 fatty acids 1000 MG capsule Take 2 g by mouth daily.    . Garlic 10 MG CAPS Take by mouth.    . hydrochlorothiazide (HYDRODIURIL) 25 MG tablet Take 1 tablet (25 mg total) by mouth daily. 30 tablet 8  . Multiple Vitamin (MULTIVITAMIN) tablet Take 1 tablet by mouth daily.    . Red Yeast Rice 600 MG CAPS Take 1 tablet by mouth daily.    . vitamin E 600 UNIT capsule Take 600 Units by mouth daily.     No facility-administered medications prior to visit.     No Known Allergies  Review of Systems  Constitutional: Negative for fever and malaise/fatigue.  HENT: Negative for congestion.   Eyes: Negative for blurred vision.  Respiratory: Negative for shortness of breath.   Cardiovascular: Negative for chest pain, palpitations and leg swelling.  Gastrointestinal: Negative for abdominal pain, blood in stool and nausea.  Genitourinary: Negative for dysuria and frequency.  Musculoskeletal: Negative for falls.  Skin: Negative for rash.  Neurological: Negative  for dizziness, loss of consciousness and headaches.  Endo/Heme/Allergies: Negative for environmental allergies.  Psychiatric/Behavioral: Negative for depression. The patient is not nervous/anxious.        Objective:    Physical Exam  Constitutional: He is oriented to person, place, and time. He appears well-developed and well-nourished. No distress.  HENT:  Head: Normocephalic and atraumatic.  Nose: Nose normal.  Eyes: Right eye exhibits no discharge. Left eye exhibits no discharge.  Neck: Normal range of motion. Neck supple.  Cardiovascular: Normal rate and regular rhythm.   No murmur heard. Pulmonary/Chest: Effort normal and breath sounds normal.  Abdominal: Soft. Bowel sounds are normal. There is no tenderness.    Musculoskeletal: He exhibits no edema.  Neurological: He is alert and oriented to person, place, and time.  Skin: Skin is warm and dry.  Psychiatric: He has a normal mood and affect.  Nursing note and vitals reviewed.   BP 120/82 (BP Location: Left Arm, Patient Position: Sitting, Cuff Size: Normal)   Pulse 65   Temp 97.9 F (36.6 C) (Oral)   Ht 5\' 6"  (1.676 m)   Wt 162 lb 2 oz (73.5 kg)   SpO2 97%   BMI 26.17 kg/m  Wt Readings from Last 3 Encounters:  10/08/16 162 lb 2 oz (73.5 kg)  04/10/16 164 lb 2 oz (74.4 kg)  12/15/15 164 lb (74.4 kg)     Lab Results  Component Value Date   WBC 4.8 04/10/2016   HGB 15.5 04/10/2016   HCT 45.9 04/10/2016   PLT 226.0 04/10/2016   GLUCOSE 100 (H) 04/10/2016   CHOL 240 (H) 04/10/2016   TRIG 71.0 04/10/2016   HDL 53.10 04/10/2016   LDLCALC 173 (H) 04/10/2016   ALT 21 04/10/2016   AST 20 04/10/2016   NA 141 04/10/2016   K 3.7 04/10/2016   CL 102 04/10/2016   CREATININE 1.20 07/14/2016   BUN 21 04/10/2016   CO2 31 04/10/2016   TSH 0.94 04/10/2016   PSA 2.78 09/19/2015   HGBA1C 6.0 04/10/2016    Lab Results  Component Value Date   TSH 0.94 04/10/2016   Lab Results  Component Value Date   WBC 4.8 04/10/2016   HGB 15.5 04/10/2016   HCT 45.9 04/10/2016   MCV 85.8 04/10/2016   PLT 226.0 04/10/2016   Lab Results  Component Value Date   NA 141 04/10/2016   K 3.7 04/10/2016   CO2 31 04/10/2016   GLUCOSE 100 (H) 04/10/2016   BUN 21 04/10/2016   CREATININE 1.20 07/14/2016   BILITOT 0.4 04/10/2016   ALKPHOS 47 04/10/2016   AST 20 04/10/2016   ALT 21 04/10/2016   PROT 7.3 04/10/2016   ALBUMIN 4.2 04/10/2016   CALCIUM 9.9 04/10/2016   GFR 90.67 04/10/2016   Lab Results  Component Value Date   CHOL 240 (H) 04/10/2016   Lab Results  Component Value Date   HDL 53.10 04/10/2016   Lab Results  Component Value Date   LDLCALC 173 (H) 04/10/2016   Lab Results  Component Value Date   TRIG 71.0 04/10/2016   Lab  Results  Component Value Date   CHOLHDL 5 04/10/2016   Lab Results  Component Value Date   HGBA1C 6.0 04/10/2016       Assessment & Plan:   Problem List Items Addressed This Visit    Hyperlipemia    .Tolerating statin, encouraged heart healthy diet, avoid trans fats, minimize simple carbs and saturated fats. Increase exercise as tolerated.  Hypertension    Well controlled, no changes to meds. Encouraged heart healthy diet such as the DASH diet and exercise as tolerated.       Hyperglycemia    hgba1c acceptable, minimize simple carbs. Increase exercise as tolerated. Continue current meds         I am having Mr. Warne maintain his Garlic, multivitamin, aspirin, fish oil-omega-3 fatty acids, Red Yeast Rice, hydrochlorothiazide, and vitamin E.  No orders of the defined types were placed in this encounter.    Penni Homans, MD

## 2016-10-08 NOTE — Assessment & Plan Note (Signed)
[id number] acceptable, minimize simple carbs. Increase exercise as tolerated. Continue current meds 

## 2016-10-08 NOTE — Patient Instructions (Addendum)
Curcumen capsules helps with arthritis   Basic Carbohydrate Counting for Diabetes Mellitus Carbohydrate counting is a method for keeping track of the amount of carbohydrates you eat. Eating carbohydrates naturally increases the level of sugar (glucose) in your blood, so it is important for you to know the amount that is okay for you to have in every meal. Carbohydrate counting helps keep the level of glucose in your blood within normal limits. The amount of carbohydrates allowed is different for every person. A dietitian can help you calculate the amount that is right for you. Once you know the amount of carbohydrates you can have, you can count the carbohydrates in the foods you want to eat. Carbohydrates are found in the following foods:  Grains, such as breads and cereals.  Dried beans and soy products.  Starchy vegetables, such as potatoes, peas, and corn.  Fruit and fruit juices.  Milk and yogurt.  Sweets and snack foods, such as cake, cookies, candy, chips, soft drinks, and fruit drinks. CARBOHYDRATE COUNTING There are two ways to count the carbohydrates in your food. You can use either of the methods or a combination of both. Reading the "Nutrition Facts" on [place] The "Nutrition Facts" is an area that is included on the labels of almost all packaged food and beverages in the [place]. It includes the serving size of that food or beverage and information about the nutrients in each serving of the food, including the grams (g) of carbohydrate per serving.  Decide the number of servings of this food or beverage that you will be able to eat or drink. Multiply that number of servings by the number of grams of carbohydrate that is listed on the label for that serving. The total will be the amount of carbohydrates you will be having when you eat or drink this food or beverage. Learning Standard Serving Sizes of Food When you eat food that is not packaged or does not include  "Nutrition Facts" on the label, you need to measure the servings in order to count the amount of carbohydrates.A serving of most carbohydrate-rich foods contains about 15 g of carbohydrates. The following list includes serving sizes of carbohydrate-rich foods that provide 15 g ofcarbohydrate per serving:   1 slice of bread (1 oz) or 1 six-inch tortilla.    of a hamburger bun or English muffin.  4-6 crackers.   cup unsweetened dry cereal.    cup hot cereal.   cup rice or pasta.    cup mashed potatoes or  of a large baked potato.  1 cup fresh fruit or one small piece of fruit.    cup canned or frozen fruit or fruit juice.  1 cup milk.   cup plain fat-free yogurt or yogurt sweetened with artificial sweeteners.   cup cooked dried beans or starchy vegetable, such as peas, corn, or potatoes.  Decide the number of standard-size servings that you will eat. Multiply that number of servings by 15 (the grams of carbohydrates in that serving). For example, if you eat 2 cups of strawberries, you will have eaten 2 servings and 30 g of carbohydrates (2 servings x 15 g = 30 g). For foods such as soups and casseroles, in which more than one food is mixed in, you will need to count the carbohydrates in each food that is included. EXAMPLE OF CARBOHYDRATE COUNTING Sample Dinner  3 oz chicken breast.   cup of brown rice.   cup of corn.  1 cup  milk.   1 cup strawberries with sugar-free whipped topping.  Carbohydrate Calculation Step 1: Identify the foods that contain carbohydrates:   Rice.   Corn.   Milk.   Strawberries. Step 2:Calculate the number of servings eaten of each:   2 servings of rice.   1 serving of corn.   1 serving of milk.   1 serving of strawberries. Step 3: Multiply each of those number of servings by 15 g:   2 servings of rice x 15 g = 30 g.   1 serving of corn x 15 g = 15 g.   1 serving of milk x 15 g = 15 g.   1 serving of  strawberries x 15 g = 15 g. Step 4: Add together all of the amounts to find the total grams of carbohydrates eaten: 30 g + 15 g + 15 g + 15 g = 75 g.   This information is not intended to replace advice given to you by your health care provider. Make sure you discuss any questions you have with your health care provider.   Document Released: 11/12/2005 Document Revised: 12/03/2014 Document Reviewed: 10/09/2013 Elsevier Interactive Patient Education [employer].

## 2016-12-10 ENCOUNTER — Telehealth: Payer: Self-pay | Admitting: Family Medicine

## 2016-12-10 ENCOUNTER — Ambulatory Visit: Payer: BLUE CROSS/BLUE SHIELD | Admitting: Medical

## 2016-12-10 NOTE — Telephone Encounter (Signed)
Patient cancelled 11:15am appointment today and [facility] to 12/17/16 with PCP, charge or no charge

## 2016-12-11 NOTE — Telephone Encounter (Signed)
charge 

## 2016-12-17 ENCOUNTER — Encounter: Payer: Self-pay | Admitting: Family Medicine

## 2016-12-17 ENCOUNTER — Ambulatory Visit: Payer: BLUE CROSS/BLUE SHIELD | Admitting: Medical

## 2016-12-21 ENCOUNTER — Other Ambulatory Visit: Payer: Self-pay | Admitting: Family Medicine

## 2016-12-23 IMAGING — CT CT ABD-PELV W/ CM
2 of 5 series · 15 of 46 positions shown, 17 images · IV contrast (APPLIED)
Comparison: None.

CLINICAL DATA: Intermittent right lower quadrant pain starting or
probe curb.

EXAM:
CT ABDOMEN AND PELVIS WITH CONTRAST
TECHNIQUE: Multidetector CT imaging of the abdomen and pelvis was performed
using the standard protocol following bolus administration of
intravenous contrast.
CONTRAST:  100mL OMNIPAQUE IOHEXOL 300 MG/ML  SOLN

[Series 2: axial st · axial · 0.90mm/px · z∈[+813,+1283]mm · 12 of 104 slices shown, 14 images]
[im 5/104  soft-tissue]
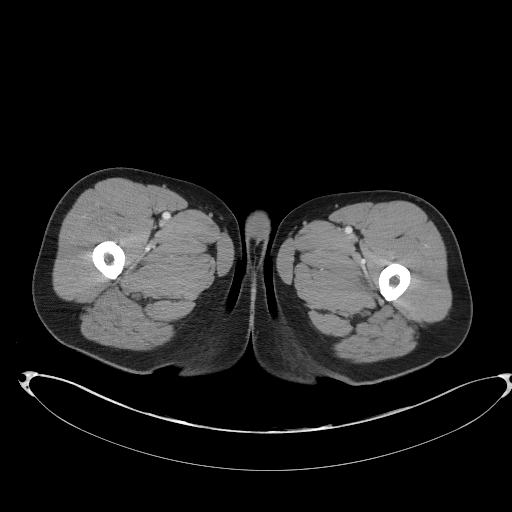
[im 5/104  bone]
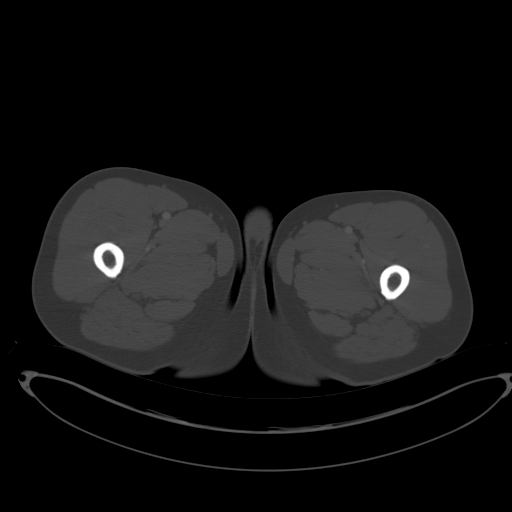
[im 15/104  soft-tissue]
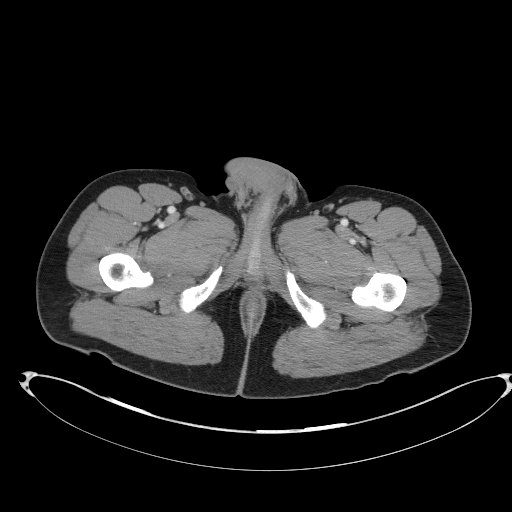
[im 25/104  soft-tissue]
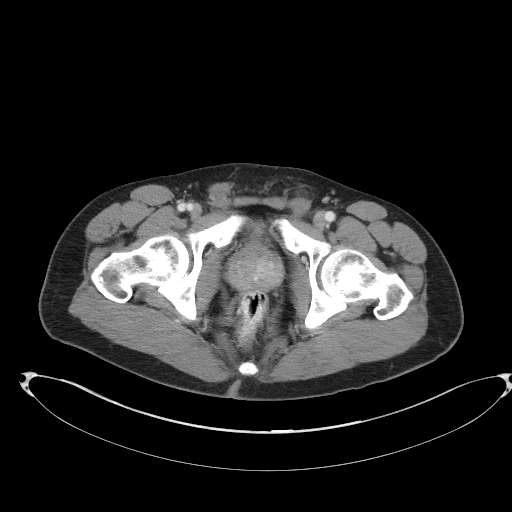
[im 30/104  soft-tissue]
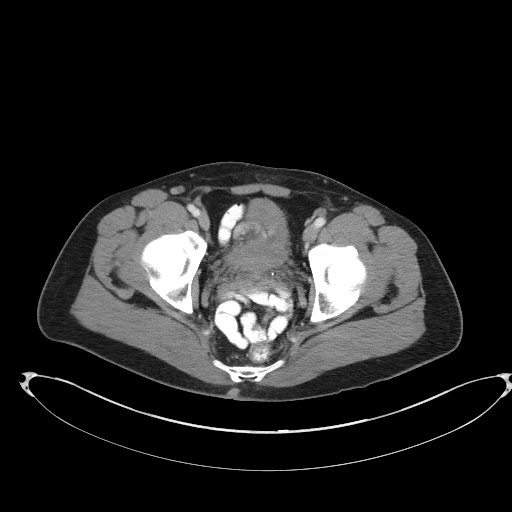
[im 40/104  soft-tissue]
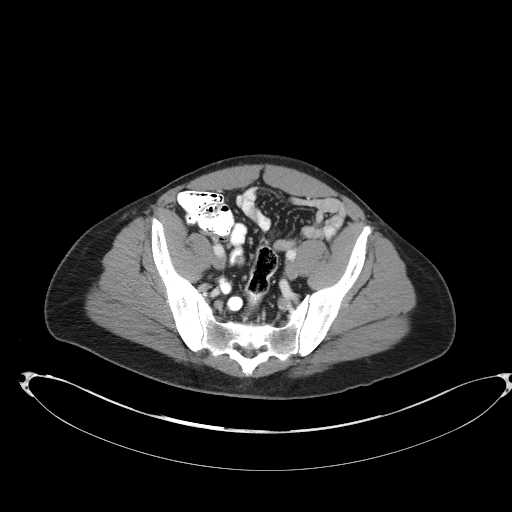
[im 50/104  soft-tissue]
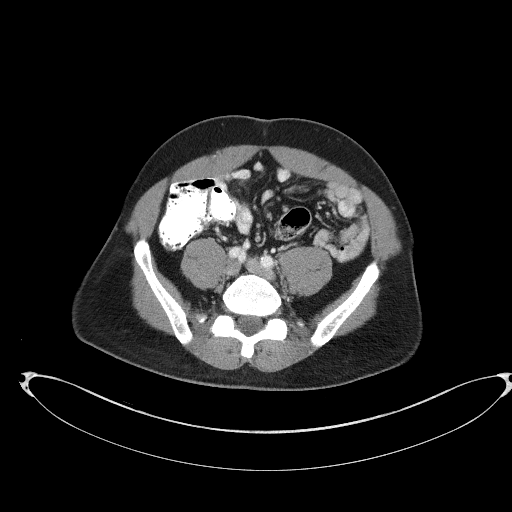
[im 54/104  soft-tissue]
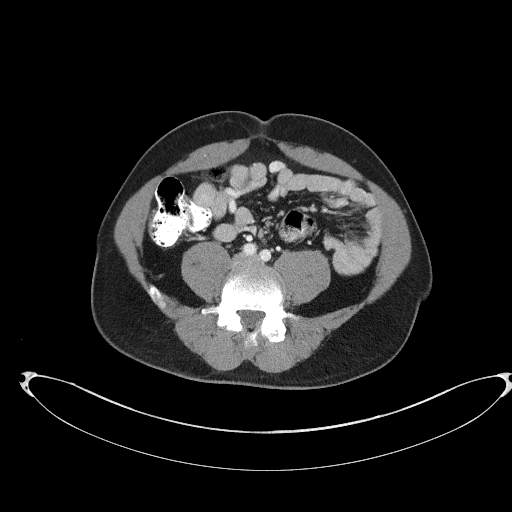
[im 64/104  soft-tissue]
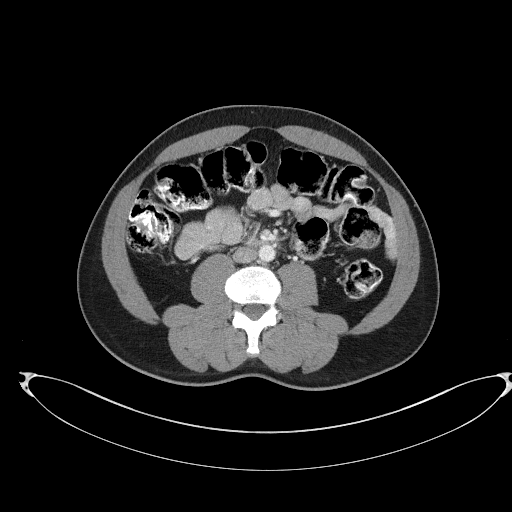
[im 74/104  soft-tissue]
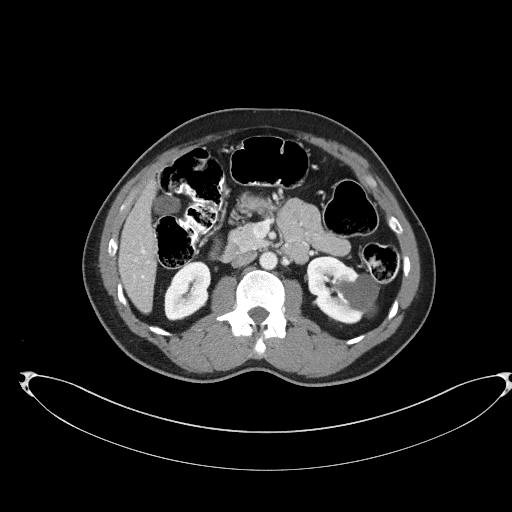
[im 74/104  bone]
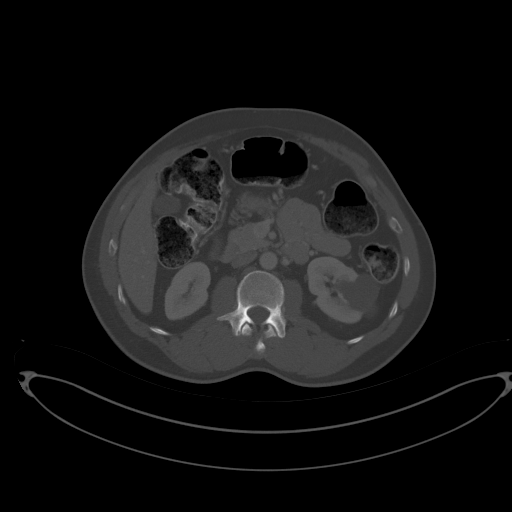
[im 79/104  soft-tissue]
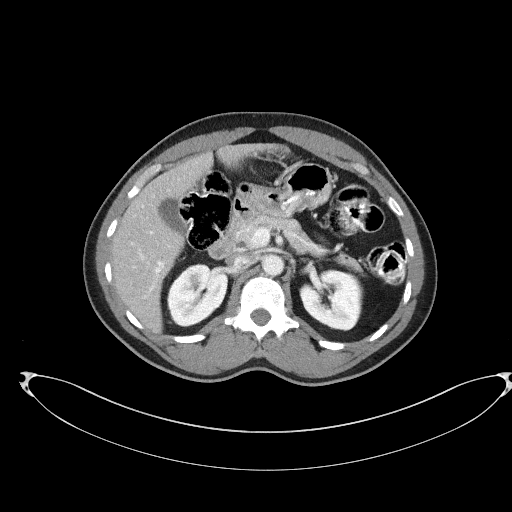
[im 89/104  soft-tissue]
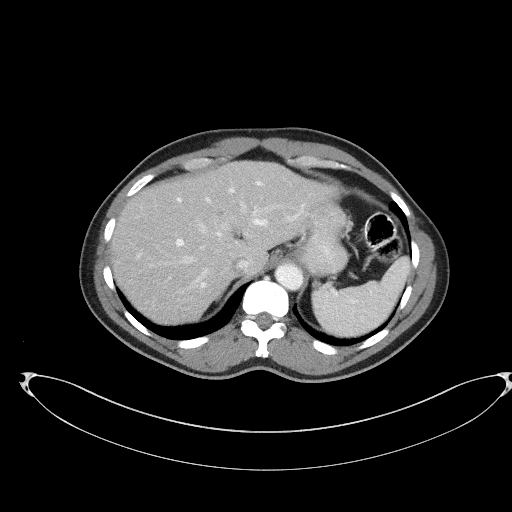
[im 99/104  soft-tissue]
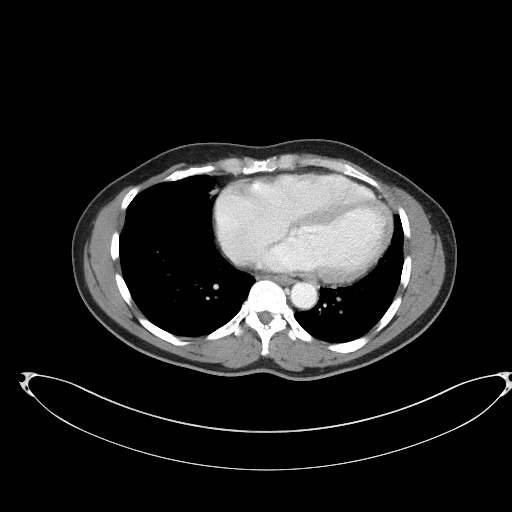

[Series 5: coronal st · coronal · 0.80mm/px · 3 of 96 slices shown]
[im 32/96  soft-tissue]
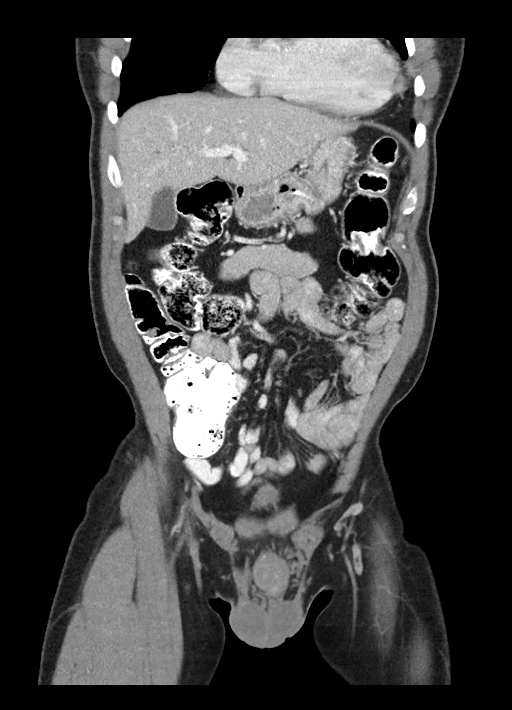
[im 43/96  soft-tissue]
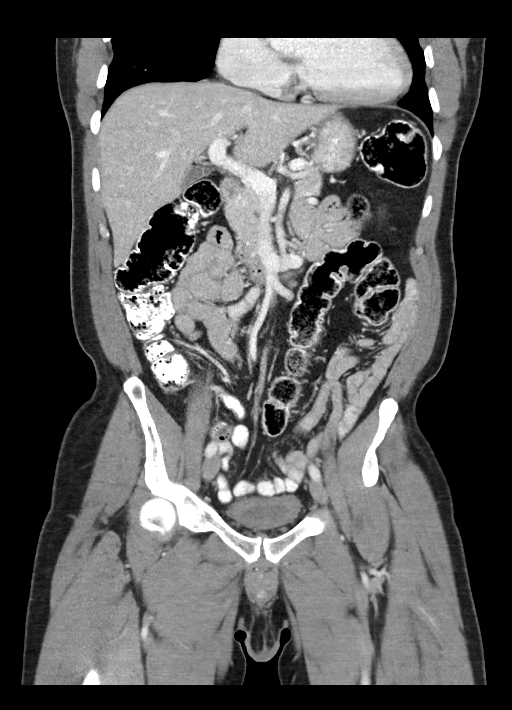
[im 53/96  soft-tissue]
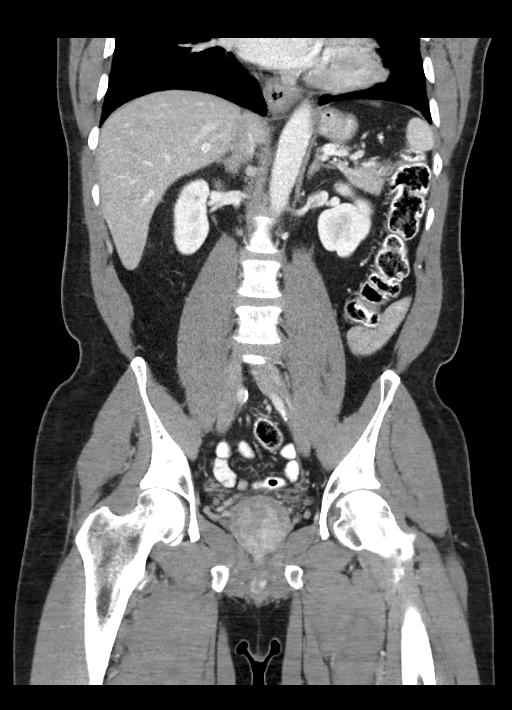

[15 of 46 positions shown; findings below may reference images not displayed]

FINDINGS: There is any small umbilical hernia containing fat without evidence
of acute complication. Tiny right paraumbilical hernia containing
fat without evidence of acute complication. Sagittal images of the
spine are unremarkable. Abdominal aorta is unremarkable.

The lung bases are unremarkable. Enhanced liver shows no focal mass.
No calcified gallstones are noted within gallbladder. Enhanced
pancreas, spleen and adrenal glands are unremarkable. Enhanced
kidneys are symmetrical in size. Exophytic cyst in midpole of the
left kidney measures 3.2 cm. There is a second partial cortical
partially exophytic cyst in midpole of the left kidney measures
cm.

No hydronephrosis or hydroureter.

Delayed renal images shows bilateral renal symmetrical excretion.
Bilateral visualized proximal ureter is unremarkable. No small bowel
obstruction. No ascites or free air. No adenopathy. Moderate stool
noted in right colon and transverse colon. Somewhat low lying cecum.
No pericecal inflammation. The terminal ileum is unremarkable.
Normal appendix is partially visualized in axial image 65 and
sagittal image 63. Prostate gland and seminal vesicles are
unremarkable. There is thickening of the wall of under distended
urinary bladder. No inguinal adenopathy. No inguinal hernia. No
destructive bony lesions are noted within pelvis.
IMPRESSION: 1. There is no evidence of acute inflammatory process within
abdomen.
2. Left renal midpole cysts the largest measures 3.3 cm. No
hydronephrosis or hydroureter.
3. No small bowel obstruction.
4. Moderate stool noted in right colon and transverse colon. No
pericecal inflammation. Somewhat low lying cecum. No pericecal
inflammation. Normal appendix.
5. Unremarkable terminal ileum.
6. Small umbilical hernia containing fat. Tiny right paraumbilical
hernia containing fat without evidence of acute complication.
7. Thickening of the wall of nondistended urinary bladder.

## 2016-12-26 ENCOUNTER — Telehealth: Payer: Self-pay | Admitting: Medical

## 2016-12-26 NOTE — Telephone Encounter (Signed)
I don't remember it being incliment weather/snow that day. We did not have delay that day. If weather was questionable can give him a break/take out charge. If he misses next appointment with me then charge.

## 2016-12-26 NOTE — Telephone Encounter (Signed)
Patient would like $50 no show fee waive due to weather increments/snow, please advise

## 2016-12-27 NOTE — Telephone Encounter (Signed)
Patient informed. 

## 2017-04-15 ENCOUNTER — Encounter: Payer: Self-pay | Admitting: Family Medicine

## 2017-04-15 ENCOUNTER — Ambulatory Visit (INDEPENDENT_AMBULATORY_CARE_PROVIDER_SITE_OTHER): Payer: BLUE CROSS/BLUE SHIELD | Admitting: Family Medicine

## 2017-04-15 VITALS — BP 126/82 | HR 75 | Temp 98.0°F | Resp 18 | Ht 66.0 in | Wt 161.0 lb

## 2017-04-15 DIAGNOSIS — I10 Essential (primary) hypertension: Secondary | ICD-10-CM | POA: Diagnosis not present

## 2017-04-15 DIAGNOSIS — M79604 Pain in right leg: Secondary | ICD-10-CM | POA: Diagnosis not present

## 2017-04-15 DIAGNOSIS — E782 Mixed hyperlipidemia: Secondary | ICD-10-CM | POA: Diagnosis not present

## 2017-04-15 DIAGNOSIS — R739 Hyperglycemia, unspecified: Secondary | ICD-10-CM | POA: Diagnosis not present

## 2017-04-15 DIAGNOSIS — Z0000 Encounter for general adult medical examination without abnormal findings: Secondary | ICD-10-CM

## 2017-04-15 HISTORY — DX: Pain in right leg: M79.604

## 2017-04-15 LAB — CBC
HCT: 45.5 % (ref 39.0–52.0)
Hemoglobin: 15.3 g/dL (ref 13.0–17.0)
MCHC: 33.7 g/dL (ref 30.0–36.0)
MCV: 87.3 fl (ref 78.0–100.0)
PLATELETS: 220 10*3/uL (ref 150.0–400.0)
RBC: 5.22 Mil/uL (ref 4.22–5.81)
RDW: 14.1 % (ref 11.5–15.5)
WBC: 4 10*3/uL (ref 4.0–10.5)

## 2017-04-15 LAB — LIPID PANEL
CHOL/HDL RATIO: 4
Cholesterol: 206 mg/dL — ABNORMAL HIGH (ref 0–200)
HDL: 53.8 mg/dL (ref >39.00)
LDL Cholesterol: 140 mg/dL — ABNORMAL HIGH (ref 0–99)
NONHDL: 152.02
Triglycerides: 60 mg/dL (ref 0.0–149.0)
VLDL: 12 mg/dL (ref 0.0–40.0)

## 2017-04-15 LAB — COMPREHENSIVE METABOLIC PANEL
ALK PHOS: 48 U/L (ref 39–117)
ALT: 15 U/L (ref 0–53)
AST: 17 U/L (ref 0–37)
Albumin: 4.4 g/dL (ref 3.5–5.2)
BILIRUBIN TOTAL: 0.6 mg/dL (ref 0.2–1.2)
BUN: 14 mg/dL (ref 6–23)
CO2: 32 meq/L (ref 19–32)
CREATININE: 1.09 mg/dL (ref 0.40–1.50)
Calcium: 9.9 mg/dL (ref 8.4–10.5)
Chloride: 100 mEq/L (ref 96–112)
GFR: 89.39 mL/min (ref >60.00)
GLUCOSE: 97 mg/dL (ref 70–99)
Potassium: 3.8 mEq/L (ref 3.5–5.1)
Sodium: 139 mEq/L (ref 135–145)
TOTAL PROTEIN: 7 g/dL (ref 6.0–8.3)

## 2017-04-15 LAB — TSH: TSH: 1.09 u[IU]/mL (ref 0.35–4.50)

## 2017-04-15 LAB — PSA: PSA: 3.61 ng/mL (ref 0.10–4.00)

## 2017-04-15 LAB — HEMOGLOBIN A1C: HEMOGLOBIN A1C: 5.9 % (ref 4.6–6.5)

## 2017-04-15 MED ORDER — TIZANIDINE HCL 2 MG PO TABS
2.0000 mg | ORAL_TABLET | Freq: Every evening | ORAL | 0 refills | Status: DC | PRN
Start: 2017-04-15 — End: 2017-08-17

## 2017-04-15 MED ORDER — MELOXICAM 7.5 MG PO TABS: 7.5000 mg | ORAL_TABLET | Freq: Two times a day (BID) | ORAL | 1 refills | Status: DC | PRN

## 2017-04-15 NOTE — Progress Notes (Signed)
[name], male   DOB: 06-Dec-1960, 56 y.o.   MRN: [id number]   Subjective:    [name], male    DOB: 1961-11-19, 56 y.o.   MRN: [id number]  Chief Complaint  Patient presents with  . Annual Exam    HPI Patient is in today for annual preventative exam and follow up on hypertension, hyperlipidemia, and hyperglycemia. He denies any recent febrile illness. 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 No recent febrile illness or hospitalizations. He denies any trauma or recent injury. No incontinence. Is noting pain in posterior thigh and posterior calf. No swelling. No weakness. Is maintaining a heart healthy diet and staying active. Denies CP/palp/SOB/HA/congestion/fevers/GI or GU c/o. Taking meds as prescribed  Past Medical History:  Diagnosis Date  . Allergy   . Constipation 12/25/2015  . Dysrhythmia    sinus arrythmia-2006-work up negative findings  . Hyperglycemia 03/15/2015  . Hyperlipemia   . Hypertension   . Leg pain, diffuse, right 04/15/2017  . Preventative health care 03/20/2015   Sees Dr Collene Mares of gastroenterology    Past Surgical History:  Procedure Laterality Date  . KNEE ARTHROSCOPY  2009   rt knee  . KNEE ARTHROSCOPY  01/16/2012   Procedure: ARTHROSCOPY KNEE;  Surgeon: Gearlean Alf, MD;  Location: [facility];  Service: Orthopedics;  Laterality: Left;  WITH DEBRIDEMENT  . VENTRAL HERNIA REPAIR      Family History  Problem Relation Age of Onset  . Hypertension Mother   . Diabetes Mother        labile  . Kidney disease Mother   . Peripheral vascular disease Mother   . Diabetes Maternal Aunt   . Heart disease Maternal Aunt   . Heart disease Father        MI?  . Drug abuse Son     Social History   Social History  . Marital status: Married    Spouse name: Delora  . Number of children: N/A  . Years of education: 75   Occupational History  . 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 Smoking status: Never Smoker  . Smokeless tobacco: Never Used  . Alcohol use No  . Drug use: No  . Sexual activity: Yes     Comment: lives with wife, no major dietary restrictions, works for [employer]   Other Topics Concern  . Not on file   Social History Narrative  . No narrative on file    Outpatient Medications Prior to Visit  Medication Sig Dispense Refill  . aspirin 81 MG tablet Take 81 mg by mouth daily.    . fish oil-omega-3 fatty acids 1000 MG capsule Take 2 g by mouth daily.    . Garlic 10 MG CAPS Take by mouth.    . hydrochlorothiazide (HYDRODIURIL) 25 MG tablet TAKE ONE TABLET BY MOUTH ONCE DAILY 30 tablet 8  . Multiple Vitamin (MULTIVITAMIN) tablet Take 1 tablet by mouth daily.    . Red Yeast Rice 600 MG CAPS Take 1 tablet by mouth daily.    . vitamin E 600 UNIT capsule Take 600 Units by mouth daily.     No facility-administered medications prior to visit.     No Known Allergies  Review of Systems  Constitutional: Negative for fever and malaise/fatigue.  HENT: Negative for congestion.   Eyes: Negative for blurred vision.  Respiratory: Negative for shortness of breath.   Cardiovascular: Negative for chest pain, palpitations and leg swelling.  Gastrointestinal: Negative for abdominal pain, blood in stool and nausea.  Genitourinary: Negative for dysuria and frequency.  Musculoskeletal: Positive for joint pain. Negative for falls.  Skin: Negative for rash.  Neurological: Negative for dizziness, loss of consciousness and headaches.  Endo/Heme/Allergies: Negative for environmental allergies.  Psychiatric/Behavioral: Negative for depression. The patient is not nervous/anxious.        Objective:    Physical Exam  Constitutional: He is oriented to person, place, and time. He appears well-developed and well-nourished. No distress.  HENT:  Head: Normocephalic and atraumatic.  Nose: Nose normal.  Eyes: Right eye  exhibits no discharge. Left eye exhibits no discharge.  Neck: Normal range of motion. Neck supple.  Cardiovascular: Normal rate and regular rhythm.   No murmur heard. Pulmonary/Chest: Effort normal and breath sounds normal.  Abdominal: Soft. Bowel sounds are normal. There is no tenderness.  Musculoskeletal: He exhibits no edema.  Neurological: He is alert and oriented to person, place, and time.  Skin: Skin is warm and dry.  Psychiatric: He has a normal mood and affect.  Nursing note and vitals reviewed.   BP 126/82 (BP Location: Left Arm, Patient Position: Sitting, Cuff Size: Normal)   Pulse 75   Temp 98 F (36.7 C) (Oral)   Resp 18   Ht 5\' 6"  (1.676 m)   Wt 161 lb (73 kg)   SpO2 96%   BMI 25.99 kg/m  Wt Readings from Last 3 Encounters:  04/15/17 161 lb (73 kg)  10/08/16 162 lb 2 oz (73.5 kg)  04/10/16 164 lb 2 oz (74.4 kg)     Lab Results  Component Value Date   WBC 4.0 04/15/2017   HGB 15.3 04/15/2017   HCT 45.5 04/15/2017   PLT 220.0 04/15/2017   GLUCOSE 97 04/15/2017   CHOL 206 (H) 04/15/2017   TRIG 60.0 04/15/2017   HDL 53.80 04/15/2017   LDLCALC 140 (H) 04/15/2017   ALT 15 04/15/2017   AST 17 04/15/2017   NA 139 04/15/2017   K 3.8 04/15/2017   CL 100 04/15/2017   CREATININE 1.09 04/15/2017   BUN 14 04/15/2017   CO2 32 04/15/2017   TSH 1.09 04/15/2017   PSA 3.61 04/15/2017   HGBA1C 5.9 04/15/2017    Lab Results  Component Value Date   TSH 1.09 04/15/2017   Lab Results  Component Value Date   WBC 4.0 04/15/2017   HGB 15.3 04/15/2017   HCT 45.5 04/15/2017   MCV 87.3 04/15/2017   PLT 220.0 04/15/2017   Lab Results  Component Value Date   NA 139 04/15/2017   K 3.8 04/15/2017   CO2 32 04/15/2017   GLUCOSE 97 04/15/2017   BUN 14 04/15/2017   CREATININE 1.09 04/15/2017   BILITOT 0.6 04/15/2017   ALKPHOS 48 04/15/2017   AST 17 04/15/2017   ALT 15 04/15/2017   PROT 7.0 04/15/2017   ALBUMIN 4.4 04/15/2017   CALCIUM 9.9 04/15/2017   GFR  89.39 04/15/2017   Lab Results  Component Value Date   CHOL 206 (H) 04/15/2017   Lab Results  Component Value Date   HDL 53.80 04/15/2017   Lab Results  Component Value Date   LDLCALC 140 (H) 04/15/2017   Lab Results  Component Value Date   TRIG 60.0 04/15/2017   Lab Results  Component Value Date   CHOLHDL 4 04/15/2017   Lab Results  Component  Value Date   HGBA1C 5.9 04/15/2017       Assessment & Plan:   Problem List Items Addressed This Visit    Hyperlipemia    Encouraged heart healthy diet, increase exercise, avoid trans fats, consider a krill oil cap daily      Relevant Orders   Lipid panel (Completed)   Hypertension - Primary    Well controlled, no changes to meds. Encouraged heart healthy diet such as the DASH diet and exercise as tolerated.       Relevant Orders   CBC (Completed)   Comprehensive metabolic panel (Completed)   TSH (Completed)   Hyperglycemia    minimize simple carbs. Increase exercise as tolerated.       Relevant Orders   Hemoglobin A1c (Completed)   TSH (Completed)   Preventative health care    Patient encouraged to maintain heart healthy diet, regular exercise, adequate sleep. Consider daily probiotics. Take medications as prescribed      Relevant Orders   PSA (Completed)   Leg pain, diffuse, right    Encourage gentle stretching as tolerated. May try NSAIDs and prescription meds as directed and report if symptoms worsen or seek immediate care. Meloxicam 7.5 mg tab po bid prn severe pain, Tizanidine 2 mg qhs prn, consider referral if worsens      Relevant Orders   Ambulatory referral to Sports Medicine      I am having Mr. Gossman start on meloxicam and tiZANidine. I am also having him maintain his Garlic, multivitamin, aspirin, fish oil-omega-3 fatty acids, Red Yeast Rice, vitamin E, and hydrochlorothiazide.  Meds ordered this encounter  Medications  . meloxicam (MOBIC) 7.5 MG tablet    Sig: Take 1 tablet (7.5 mg total)  by mouth 2 (two) times daily as needed for pain. With food    Dispense:  40 tablet    Refill:  1  . tiZANidine (ZANAFLEX) 2 MG tablet    Sig: Take 1 tablet (2 mg total) by mouth at bedtime as needed for muscle spasms.    Dispense:  20 tablet    Refill:  0    CMA served as scribe during this visit. History, Physical and Plan performed by medical provider. Documentation and orders reviewed and attested to.  Penni Homans, MD

## 2017-04-15 NOTE — Assessment & Plan Note (Signed)
Encouraged heart healthy diet, increase exercise, avoid trans fats, consider a krill oil cap daily 

## 2017-04-15 NOTE — Assessment & Plan Note (Signed)
minimize simple carbs. Increase exercise as tolerated.  

## 2017-04-15 NOTE — Assessment & Plan Note (Signed)
Patient encouraged to maintain heart healthy diet, regular exercise, adequate sleep. Consider daily probiotics. Take medications as prescribed 

## 2017-04-15 NOTE — Assessment & Plan Note (Addendum)
Encourage gentle stretching as tolerated. May try NSAIDs and prescription meds as directed and report if symptoms worsen or seek immediate care. Meloxicam 7.5 mg tab po bid prn severe pain, Tizanidine 2 mg qhs prn, consider referral if worsens

## 2017-04-15 NOTE — Patient Instructions (Signed)
Consider OTC Lidocaine gel or patch Preventive Care 40-64 Years, Male Preventive care refers to lifestyle choices and visits with your health care provider that can promote health and wellness. What does preventive care include?  A yearly physical exam. This is also called an annual well check.  Dental exams once or twice a year.  Routine eye exams. Ask your health care provider how often you should have your eyes checked.  Personal lifestyle choices, including:  Daily care of your teeth and gums.  Regular physical activity.  Eating a healthy diet.  Avoiding tobacco and drug use.  Limiting alcohol use.  Practicing safe sex.  Taking low-dose aspirin every day starting at age 62. What happens during an annual well check? The services and screenings done by your health care provider during your annual well check will depend on your age, overall health, lifestyle risk factors, and family history of disease. Counseling  Your health care provider may ask you questions about your:  Alcohol use.  Tobacco use.  Drug use.  Emotional well-being.  Home and relationship well-being.  Sexual activity.  Eating habits.  Work and work Statistician. Screening  You may have the following tests or measurements:  Height, weight, and BMI.  Blood pressure.  Lipid and cholesterol levels. These may be checked every 5 years, or more frequently if you are over 24 years old.  Skin check.  Lung cancer screening. You may have this screening every year starting at age 41 if you have a 30-pack-year history of smoking and currently smoke or have quit within the past 15 years.  Fecal occult blood test (FOBT) of the stool. You may have this test every year starting at age 68.  Flexible sigmoidoscopy or colonoscopy. You may have a sigmoidoscopy every 5 years or a colonoscopy every 10 years starting at age 58.  Prostate cancer screening. Recommendations will vary depending on your family  history and other risks.  Hepatitis C blood test.  Hepatitis B blood test.  Sexually transmitted disease (STD) testing.  Diabetes screening. This is done by checking your blood sugar (glucose) after you have not eaten for a while (fasting). You may have this done every 1-3 years. Discuss your test results, treatment options, and if necessary, the need for more tests with your health care provider. Vaccines  Your health care provider may recommend certain vaccines, such as:  Influenza vaccine. This is recommended every year.  Tetanus, diphtheria, and acellular pertussis (Tdap, Td) vaccine. You may need a Td booster every 10 years.  Varicella vaccine. You may need this if you have not been vaccinated.  Zoster vaccine. You may need this after age 57.  Measles, mumps, and rubella (MMR) vaccine. You may need at least one dose of MMR if you were born in 1957 or later. You may also need a second dose.  Pneumococcal 13-valent conjugate (PCV13) vaccine. You may need this if you have certain conditions and have not been vaccinated.  Pneumococcal polysaccharide (PPSV23) vaccine. You may need one or two doses if you smoke cigarettes or if you have certain conditions.  Meningococcal vaccine. You may need this if you have certain conditions.  Hepatitis A vaccine. You may need this if you have certain conditions or if you travel or work in places where you may be exposed to hepatitis A.  Hepatitis B vaccine. You may need this if you have certain conditions or if you travel or work in places where you may be exposed to hepatitis B.  Haemophilus influenzae type b (Hib) vaccine. You may need this if you have certain risk factors. Talk to your health care provider about which screenings and vaccines you need and how often you need them. This information is not intended to replace advice given to you by your health care provider. Make sure you discuss any questions you have with your health care  provider. Document Released: 12/09/2015 Document Revised: 08/01/2016 Document Reviewed: 09/13/2015 Elsevier Interactive Patient Education  2017 [place]

## 2017-04-15 NOTE — Assessment & Plan Note (Signed)
Well controlled, no changes to meds. Encouraged heart healthy diet such as the DASH diet and exercise as tolerated.  °

## 2017-04-23 ENCOUNTER — Ambulatory Visit (INDEPENDENT_AMBULATORY_CARE_PROVIDER_SITE_OTHER): Payer: BLUE CROSS/BLUE SHIELD | Admitting: Family Medicine

## 2017-04-23 ENCOUNTER — Encounter: Payer: Self-pay | Admitting: Family Medicine

## 2017-04-23 DIAGNOSIS — M79604 Pain in right leg: Secondary | ICD-10-CM | POA: Diagnosis not present

## 2017-04-23 NOTE — Patient Instructions (Signed)
Your pain is due to hamstring strain, arthritis, and the swelling from your arthritis. These are the different medications you can take for this: Tylenol 500mg  1-2 tabs three times a day for pain. Meloxicam twice a day with food as you have been. Capsaicin, aspercreme, or biofreeze topically up to four times a day may also help with pain. Some supplements that may help for arthritis: Boswellia extract, curcumin, pycnogenol Cortisone injections are an option with or without pulling the fluid off the knee. If cortisone injections do not help, there are different types of shots that may help but they take longer to take effect. It's important that you continue to stay active. Hamstring curls, swings, half-lunges, calf raises, straight leg raises, knee extensions 3 sets of 10 once a day (add ankle weight if these become too easy) at least every other day. Consider physical therapy to strengthen muscles around the joint that hurts to take pressure off of the joint itself. Shoe inserts with good arch support may be helpful. Heat or ice 15 minutes at a time 3-4 times a day as needed to help with pain. Swimming, cycling, elliptical for exercise. Follow up with me in 1 month or as needed.

## 2017-04-24 DIAGNOSIS — M1990 Unspecified osteoarthritis, unspecified site: Secondary | ICD-10-CM | POA: Insufficient documentation

## 2017-04-24 NOTE — Progress Notes (Signed)
PCP and consultation requested by: Mosie Lukes, MD  Subjective:   HPI: Patient is a 56 y.o. male here for right leg/knee pain.  Patient reports he's had about 6 months of right leg pain. He has known arthritis in right knee. Also has pain in calf and distal thigh posteriorly though. Tried mobic, tizanidine, stretching, acupuncture. Feels better when using elliptical. Prior arthroscopic surgery. Not noticed any swelling. Pain is 3/10 but up to 9/10 and sharp at times. No skin changes, numbness.  Past Medical History:  Diagnosis Date  . Allergy   . Constipation 12/25/2015  . Dysrhythmia    sinus arrythmia-2006-work up negative findings  . Hyperglycemia 03/15/2015  . Hyperlipemia   . Hypertension   . Leg pain, diffuse, right 04/15/2017  . Preventative health care 03/20/2015   Sees Dr Collene Mares of gastroenterology    Current Outpatient Prescriptions on File Prior to Visit  Medication Sig Dispense Refill  . aspirin 81 MG tablet Take 81 mg by mouth daily.    . fish oil-omega-3 fatty acids 1000 MG capsule Take 2 g by mouth daily.    . Garlic 10 MG CAPS Take by mouth.    . hydrochlorothiazide (HYDRODIURIL) 25 MG tablet TAKE ONE TABLET BY MOUTH ONCE DAILY 30 tablet 8  . meloxicam (MOBIC) 7.5 MG tablet Take 1 tablet (7.5 mg total) by mouth 2 (two) times daily as needed for pain. With food 40 tablet 1  . Multiple Vitamin (MULTIVITAMIN) tablet Take 1 tablet by mouth daily.    . Red Yeast Rice 600 MG CAPS Take 1 tablet by mouth daily.    Marland Kitchen tiZANidine (ZANAFLEX) 2 MG tablet Take 1 tablet (2 mg total) by mouth at bedtime as needed for muscle spasms. 20 tablet 0  . vitamin E 600 UNIT capsule Take 600 Units by mouth daily.     No current facility-administered medications on file prior to visit.     Past Surgical History:  Procedure Laterality Date  . KNEE ARTHROSCOPY  2009   rt knee  . KNEE ARTHROSCOPY  01/16/2012   Procedure: ARTHROSCOPY KNEE;  Surgeon: Gearlean Alf, MD;  Location:  [facility];  Service: Orthopedics;  Laterality: Left;  WITH DEBRIDEMENT  . VENTRAL HERNIA REPAIR      No Known Allergies  Social History   Social History  . Marital status: Married    Spouse name: Delora  . Number of children: N/A  . Years of education: 45   Occupational History  . 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 Smoking status: Never Smoker  . Smokeless tobacco: Never Used  . Alcohol use No  . Drug use: No  . Sexual activity: Yes     Comment: lives with wife, no major dietary restrictions, works for [employer]   Other Topics Concern  . Not on file   Social History Narrative  . No narrative on file    Family History  Problem Relation Age of Onset  . Hypertension Mother   . Diabetes Mother        labile  . Kidney disease Mother   . Peripheral vascular disease Mother   . Diabetes Maternal Aunt   . Heart disease Maternal Aunt   . Heart disease Father        MI?  . Drug abuse Son     BP (!) 152/94   Pulse 69   Ht 5\' 6"  (1.676 m)   Wt 160  lb (72.6 kg)   BMI 25.82 kg/m   Review of Systems: See HPI above.     Objective:  Physical Exam:  Gen: NAD, comfortable in exam room  Right knee/leg: Mild - mod effusion.  No bruising, other deformity. TTP mildly medial joint line, popliteal fossa, distal medial hamstring. ROM 0 - 120 degrees with mild pain on knee flexion. Negative ant/post drawers. Negative valgus/varus testing. Negative lachmanns. Negative mcmurrays, apleys, patellar apprehension. NV intact distally.  Left knee: FROM without pain.   Assessment & Plan:  1. 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  Also found to have effusion confirmed by ultrasound, some hamstring pain/strain.  Discussed tylenol, meloxicam, some supplements that may help, topical medications.  He declined aspiration/injection for now.  Shown home exercise program to do daily.  Icing, arch supports.   Consider PT as well.  F/u in 1 month or prn.

## 2017-04-24 NOTE — Assessment & Plan Note (Signed)
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  Also found to have effusion confirmed by ultrasound, some hamstring pain/strain.  Discussed tylenol, meloxicam, some supplements that may help, topical medications.  He declined aspiration/injection for now.  Shown home exercise program to do daily.  Icing, arch supports.  Consider PT as well.  F/u in 1 month or prn.

## 2017-05-27 ENCOUNTER — Encounter: Payer: Self-pay | Admitting: Family Medicine

## 2017-05-27 ENCOUNTER — Ambulatory Visit (INDEPENDENT_AMBULATORY_CARE_PROVIDER_SITE_OTHER): Payer: BLUE CROSS/BLUE SHIELD | Admitting: Family Medicine

## 2017-05-27 DIAGNOSIS — M79604 Pain in right leg: Secondary | ICD-10-CM

## 2017-05-27 NOTE — Progress Notes (Signed)
PCP and consultation requested by: Tommy Lukes, MD  Subjective:   HPI: Patient is a 56 y.o. male here for right leg/knee pain.  5/29: Patient reports he's had about 6 months of right leg pain. He has known arthritis in right knee. Also has pain in calf and distal thigh posteriorly though. Tried mobic, tizanidine, stretching, acupuncture. Feels better when using elliptical. Prior arthroscopic surgery. Not noticed any swelling. Pain is 3/10 but up to 9/10 and sharp at times. No skin changes, numbness.  7/2: Patient reports he feels about the same. Has been doing home exercises, takes ibuprofen occasionally. Still with pain about right leg mainly in calves, some in hamstring area on right. Worse with steps. Not icing. Pain level 3/10 but up to 8/10 at times. No skin changes, numbness.  Past Medical History:  Diagnosis Date  . Allergy   . Constipation 12/25/2015  . Dysrhythmia    sinus arrythmia-2006-work up negative findings  . Hyperglycemia 03/15/2015  . Hyperlipemia   . Hypertension   . Leg pain, diffuse, right 04/15/2017  . Preventative health care 03/20/2015   Sees Dr Collene Mares of gastroenterology    Current Outpatient Prescriptions on File Prior to Visit  Medication Sig Dispense Refill  . aspirin 81 MG tablet Take 81 mg by mouth daily.    . fish oil-omega-3 fatty acids 1000 MG capsule Take 2 g by mouth daily.    . Garlic 10 MG CAPS Take by mouth.    . hydrochlorothiazide (HYDRODIURIL) 25 MG tablet TAKE ONE TABLET BY MOUTH ONCE DAILY 30 tablet 8  . meloxicam (MOBIC) 7.5 MG tablet Take 1 tablet (7.5 mg total) by mouth 2 (two) times daily as needed for pain. With food 40 tablet 1  . Multiple Vitamin (MULTIVITAMIN) tablet Take 1 tablet by mouth daily.    . Red Yeast Rice 600 MG CAPS Take 1 tablet by mouth daily.    Marland Kitchen tiZANidine (ZANAFLEX) 2 MG tablet Take 1 tablet (2 mg total) by mouth at bedtime as needed for muscle spasms. 20 tablet 0  . vitamin E 600 UNIT capsule Take  600 Units by mouth daily.     No current facility-administered medications on file prior to visit.     Past Surgical History:  Procedure Laterality Date  . KNEE ARTHROSCOPY  2009   rt knee  . KNEE ARTHROSCOPY  01/16/2012   Procedure: ARTHROSCOPY KNEE;  Surgeon: Gearlean Alf, MD;  Location: [facility];  Service: Orthopedics;  Laterality: Left;  WITH DEBRIDEMENT  . VENTRAL HERNIA REPAIR      No Known Allergies  Social History   Social History  . Marital status: Married    Spouse name: Delora  . Number of children: N/A  . Years of education: 41   Occupational History  . 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 Smoking status: Never Smoker  . Smokeless tobacco: Never Used  . Alcohol use No  . Drug use: No  . Sexual activity: Yes     Comment: lives with wife, no major dietary restrictions, works for [employer]   Other Topics Concern  . Not on file   Social History Narrative  . No narrative on file    Family History  Problem Relation Age of Onset  . Hypertension Mother   . Diabetes Mother        labile  . Kidney disease Mother   . Peripheral vascular disease Mother   .  Diabetes Maternal Aunt   . Heart disease Maternal Aunt   . Heart disease Father        MI?  . Drug abuse Son     BP (!) 143/82   Pulse 79   Ht 5\' 6"  (1.676 m)   Wt 160 lb (72.6 kg)   BMI 25.82 kg/m   Review of Systems: See HPI above.     Objective:  Physical Exam:  Gen: NAD, comfortable in exam room  Right knee/leg: Mod effusion.  No bruising, other deformity. No TTP currently. ROM 0 - 120 degrees with mild pain on knee flexion. Negative ant/post drawers. Negative valgus/varus testing. Negative lachmanns. Negative mcmurrays, apleys, patellar apprehension. NV intact distally.  Left knee: FROM without pain.   Assessment & Plan:  1. 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  Describes spasms/strain of calf and  hamstring musculature as well though discussed effusion can cause posterior pain.  Discussed tylenol, meloxicam, some supplements that may help, topical medications.  Encouraged considering physical therapy, aspiration/injection.  He would like to continue current management for now and f/u prn.

## 2017-05-27 NOTE — Patient Instructions (Signed)
Your pain is consistent with strain/spasms of hamstring and calf muscles though you have swelling and arthritis of this knee that can cause these same symptoms. As discussed last visit these are the different medications you can take for this: Tylenol 500mg  1-2 tabs three times a day for pain. Meloxicam twice a day with food as you have been. Capsaicin, aspercreme, or biofreeze topically up to four times a day may also help with pain. Some supplements that may help for arthritis: Boswellia extract, curcumin, pycnogenol I would consider either physical therapy or draining and injecting your knee if you decided to take any further steps. Continue hamstring curls, swings, half-lunges, calf raises, straight leg raises, knee extensions 3 sets of 10 once a day (add ankle weight if these become too easy) at least every other day. Heat or ice 15 minutes at a time 3-4 times a day as needed to help with pain. Swimming, cycling, elliptical for exercise. Follow up with me in as needed.

## 2017-05-27 NOTE — Assessment & Plan Note (Signed)
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  Describes spasms/strain of calf and hamstring musculature as well though discussed effusion can cause posterior pain.  Discussed tylenol, meloxicam, some supplements that may help, topical medications.  Encouraged considering physical therapy, aspiration/injection.  He would like to continue current management for now and f/u prn.

## 2017-08-17 ENCOUNTER — Emergency Department (HOSPITAL_COMMUNITY)
Admission: EM | Admit: 2017-08-17 | Discharge: 2017-08-17 | Disposition: A | Discharge status: Home / Self Care | Payer: BLUE CROSS/BLUE SHIELD | Attending: Emergency Medicine | Admitting: Emergency Medicine

## 2017-08-17 DIAGNOSIS — M791 Myalgia: Secondary | ICD-10-CM | POA: Diagnosis not present

## 2017-08-17 DIAGNOSIS — I10 Essential (primary) hypertension: Secondary | ICD-10-CM | POA: Diagnosis not present

## 2017-08-17 DIAGNOSIS — M25512 Pain in left shoulder: Secondary | ICD-10-CM | POA: Diagnosis present

## 2017-08-17 DIAGNOSIS — M7918 Myalgia, other site: Secondary | ICD-10-CM

## 2017-08-17 DIAGNOSIS — Z7982 Long term (current) use of aspirin: Secondary | ICD-10-CM | POA: Diagnosis not present

## 2017-08-17 DIAGNOSIS — Z79899 Other long term (current) drug therapy: Secondary | ICD-10-CM | POA: Diagnosis not present

## 2017-08-17 LAB — URINALYSIS, ROUTINE W REFLEX MICROSCOPIC
BILIRUBIN URINE: NEGATIVE
GLUCOSE, UA: NEGATIVE mg/dL
HGB URINE DIPSTICK: NEGATIVE
Ketones, ur: 20 mg/dL — AB
Leukocytes, UA: NEGATIVE
Nitrite: NEGATIVE
PROTEIN: NEGATIVE mg/dL
Specific Gravity, Urine: 1.024 (ref 1.005–1.030)
pH: 5 (ref 5.0–8.0)

## 2017-08-17 LAB — COMPREHENSIVE METABOLIC PANEL
ALT: 23 U/L (ref 17–63)
AST: 22 U/L (ref 15–41)
Albumin: 4.1 g/dL (ref 3.5–5.0)
Alkaline Phosphatase: 51 U/L (ref 38–126)
Anion gap: 8 (ref 5–15)
BILIRUBIN TOTAL: 0.5 mg/dL (ref 0.3–1.2)
BUN: 24 mg/dL — ABNORMAL HIGH (ref 6–20)
CALCIUM: 9.2 mg/dL (ref 8.9–10.3)
CO2: 28 mmol/L (ref 22–32)
CREATININE: 1.04 mg/dL (ref 0.61–1.24)
Chloride: 103 mmol/L (ref 101–111)
Glucose, Bld: 94 mg/dL (ref 65–99)
Potassium: 3.9 mmol/L (ref 3.5–5.1)
Sodium: 139 mmol/L (ref 135–145)
TOTAL PROTEIN: 7.4 g/dL (ref 6.5–8.1)

## 2017-08-17 LAB — CBC WITH DIFFERENTIAL/PLATELET
BASOS ABS: 0 10*3/uL (ref 0.0–0.1)
Basophils Relative: 1 %
EOS PCT: 4 %
Eosinophils Absolute: 0.2 10*3/uL (ref 0.0–0.7)
HEMATOCRIT: 44.4 % (ref 39.0–52.0)
Hemoglobin: 15.2 g/dL (ref 13.0–17.0)
LYMPHS ABS: 2 10*3/uL (ref 0.7–4.0)
Lymphocytes Relative: 40 %
MCH: 29.6 pg (ref 26.0–34.0)
MCHC: 34.2 g/dL (ref 30.0–36.0)
MCV: 86.5 fL (ref 78.0–100.0)
MONO ABS: 0.5 10*3/uL (ref 0.1–1.0)
MONOS PCT: 10 %
NEUTROS ABS: 2.3 10*3/uL (ref 1.7–7.7)
Neutrophils Relative %: 45 %
Platelets: 199 10*3/uL (ref 150–400)
RBC: 5.13 MIL/uL (ref 4.22–5.81)
RDW: 14 % (ref 11.5–15.5)
WBC: 5 10*3/uL (ref 4.0–10.5)

## 2017-08-17 LAB — LIPASE, BLOOD: LIPASE: 23 U/L (ref 11–51)

## 2017-08-17 MED ORDER — LIDOCAINE 5 % EX PTCH
1.0000 | MEDICATED_PATCH | CUTANEOUS | Status: DC
  Administered 2017-08-17: 1 via TRANSDERMAL
  Filled 2017-08-17: qty 1

## 2017-08-17 MED ORDER — CYCLOBENZAPRINE HCL 10 MG PO TABS: 10.0000 mg | ORAL_TABLET | Freq: Two times a day (BID) | ORAL | 0 refills | Status: DC | PRN

## 2017-08-17 MED ORDER — IBUPROFEN 200 MG PO TABS
400.0000 mg | ORAL_TABLET | Freq: Once | ORAL | Status: AC
  Administered 2017-08-17: 400 mg via ORAL
  Filled 2017-08-17: qty 2

## 2017-08-17 NOTE — ED Triage Notes (Signed)
Pt reports flank and shoulder pain denies event or injury denies chest pain or SOB onset x2 days

## 2017-08-17 NOTE — ED Provider Notes (Signed)
[facility] DEPT Provider Note   CSN: [id number] Arrival date & time: 08/17/17  0310     History   Chief Complaint Chief Complaint  Patient presents with  . Shoulder Pain  . Flank Pain    HPI  Blood pressure (!) 137/99, pulse 60, temperature 98.1 F (36.7 C), temperature source Oral, resp. rate 18, height 5\' 6"  (1.676 m), weight 72.6 kg (160 lb), SpO2 99 %.  Tommy Farrell is a 56 y.o. male complaining ofLeft-sided infrascapular pain radiating around to the left midaxillary area rated at 7 out of 10 described as sharp, exacerbated by exertion and associated with localized swelling onset 2 days ago. Patient took acetaminophen at home with little relief. He does not report any exacerbation with deep breathing, exertion. He denies anterior chest pain, shortness of breath, cough, fever, chills, abdominal pain, nausea, vomiting, change in bowel or bladder habits including hematuria. He denies trauma, recent heavy lifting. He states that he is right-hand-dominant.   Past Medical History:  Diagnosis Date  . Allergy   . Constipation 12/25/2015  . Dysrhythmia    sinus arrythmia-2006-work up negative findings  . Hyperglycemia 03/15/2015  . Hyperlipemia   . Hypertension   . Leg pain, diffuse, right 04/15/2017  . Preventative health care 03/20/2015   Sees Dr Collene Mares of gastroenterology    Patient Active Problem List   Diagnosis Date Noted  . Arthritis 04/24/2017  . Leg pain, diffuse, right 04/15/2017  . Abdominal pain, chronic, right lower quadrant 12/25/2015  . Constipation 12/25/2015  . Groin pain 10/02/2015  . Preventative health care 03/20/2015  . Hyperglycemia 03/15/2015  . Hyperlipemia   . Hypertension   . Allergy   . Prediabetes 11/16/2013    Past Surgical History:  Procedure Laterality Date  . KNEE ARTHROSCOPY  2009   rt knee  . KNEE ARTHROSCOPY  01/16/2012   Procedure: ARTHROSCOPY KNEE;  Surgeon: Gearlean Alf, MD;  [facility];   Service: Orthopedics;  Laterality: Left;  WITH DEBRIDEMENT  . VENTRAL HERNIA REPAIR         Home Medications    Prior to Admission medications   Medication Sig Start Date End Date Taking? Authorizing Provider  aspirin 81 MG tablet Take 81 mg by mouth daily.    [provider]  cyclobenzaprine (FLEXERIL) 10 MG tablet Take 1 tablet (10 mg total) by mouth 2 (two) times daily as needed for muscle spasms. 08/17/17   Ally Knodel, Elmyra Ricks, PA-C  fish oil-omega-3 fatty acids 1000 MG capsule Take 2 g by mouth daily.    [provider]  Garlic 10 MG CAPS Take by mouth.    [provider]  hydrochlorothiazide (HYDRODIURIL) 25 MG tablet TAKE ONE TABLET BY MOUTH ONCE DAILY 12/21/16   Mosie Lukes, MD  meloxicam (MOBIC) 7.5 MG tablet Take 1 tablet (7.5 mg total) by mouth 2 (two) times daily as needed for pain. With food 04/15/17   Mosie Lukes, MD  Multiple Vitamin (MULTIVITAMIN) tablet Take 1 tablet by mouth daily.    [provider]  Red Yeast Rice 600 MG CAPS Take 1 tablet by mouth daily.    [provider]  vitamin E 600 UNIT capsule Take 600 Units by mouth daily.    [provider]    Family History Family History  Problem Relation Age of Onset  . Hypertension Mother   . Diabetes Mother        labile  . Kidney disease Mother   .  Peripheral vascular disease Mother   . Diabetes Maternal Aunt   . Heart disease Maternal Aunt   . Heart disease Father        MI?  . Drug abuse Son     Social History Social History  Substance Use Topics  . Smoking status: Never Smoker  . Smokeless tobacco: Never Used  . Alcohol use No     Allergies   Patient has no known allergies.   Review of Systems Review of Systems  A complete review of systems was obtained and all systems are negative except as noted in the HPI and PMH.   Physical Exam Updated Vital Signs BP (!) 152/97 (BP Location: Right Arm)   Pulse 63   Temp 98.1 F (36.7 C)  (Oral)   Resp 17   Ht 5\' 6"  (1.676 m)   Wt 72.6 kg (160 lb)   SpO2 98%   BMI 25.82 kg/m   Physical Exam  Constitutional: He is oriented to person, place, and time. He appears well-developed and well-nourished. No distress.  HENT:  Head: Normocephalic and atraumatic.  Mouth/Throat: Oropharynx is clear and moist.  Eyes: Pupils are equal, round, and reactive to light. Conjunctivae and EOM are normal.  Neck: Normal range of motion. No JVD present. No tracheal deviation present.  Cardiovascular: Normal rate, regular rhythm and intact distal pulses.   Radial pulse equal bilaterally  Pulmonary/Chest: Effort normal and breath sounds normal. No stridor. No respiratory distress. He has no wheezes. He has no rales. He exhibits no tenderness.  Abdominal: Soft. He exhibits no distension and no mass. There is no tenderness. There is no rebound and no guarding.  Musculoskeletal: Normal range of motion. He exhibits tenderness. He exhibits no edema.       Back:  Tenderness as diagrammed with very mild associated muscular spasm, no skin changes or rash.  Neurological: He is alert and oriented to person, place, and time.  Skin: Skin is warm. He is not diaphoretic.  Psychiatric: He has a normal mood and affect.  Nursing note and vitals reviewed.    ED Treatments / Results  Labs (all labs ordered are listed, but only abnormal results are displayed) Labs Reviewed  COMPREHENSIVE METABOLIC PANEL - Abnormal; Notable for the following:       Result Value   BUN 24 (*)    All other components within normal limits  URINALYSIS, ROUTINE W REFLEX MICROSCOPIC - Abnormal; Notable for the following:    Ketones, ur 20 (*)    All other components within normal limits  CBC WITH DIFFERENTIAL/PLATELET  LIPASE, BLOOD    EKG  EKG Interpretation None       Radiology No results found.  Procedures Procedures (including critical care time)  Medications Ordered in ED Medications  lidocaine (LIDODERM) 5  % 1 patch (1 patch Transdermal Patch Applied 08/17/17 0648)  ibuprofen (ADVIL,MOTRIN) tablet 400 mg (400 mg Oral Given 08/17/17 0647)     Initial Impression / Assessment and Plan / ED Course  I have reviewed the triage vital signs and the nursing notes.  Pertinent labs & imaging results that were available during my care of the patient were reviewed by me and considered in my medical decision making (see chart for details).     Vitals:   08/17/17 0326 08/17/17 0605 08/17/17 0630 08/17/17 0745  BP:  (!) 137/99 (!) 137/94 (!) 152/97  Pulse:  60 (!) 56 63  Resp:    17  Temp:  TempSrc:      SpO2:  99% 97% 98%  Weight: 72.6 kg (160 lb)     Height: 5\' 6"  (1.676 m)       Medications  lidocaine (LIDODERM) 5 % 1 patch (1 patch Transdermal Patch Applied 08/17/17 0648)  ibuprofen (ADVIL,MOTRIN) tablet 400 mg (400 mg Oral Given 08/17/17 0647)    Tommy Farrell is 56 y.o. male presenting with Left localized infrascapular pain there is point tenderness to palpation, no overlying rash. No anterior chest pain, Shortness of breath, and given the exacerbation with palpation I doubt this is ACS. Likely musculoskeletal pain however, will check basic blood work, patient will be given lidocaine patch and Advil.  Blood work and urine reassuring. Likely musculoskeletal pain. Will follow with primary care.  Evaluation does not show pathology that would require ongoing emergent intervention or inpatient treatment. Pt is hemodynamically stable and mentating appropriately. Discussed findings and plan with patient/guardian, who agrees with care plan. All questions answered. Return precautions discussed and outpatient follow up given.    Final Clinical Impressions(s) / ED Diagnoses   Final diagnoses:  Musculoskeletal pain    New Prescriptions New Prescriptions   CYCLOBENZAPRINE (FLEXERIL) 10 MG TABLET    Take 1 tablet (10 mg total) by mouth 2 (two) times daily as needed for muscle spasms.       Waynetta Pean 08/17/17 2820    Virgel Manifold, MD 08/17/17 1135

## 2017-08-17 NOTE — Discharge Instructions (Signed)
For pain control you may take up to 800mg  of ibuprofen (that is usually 4 over the counter pills)  3 times a day (take with food) and acetaminophen 975mg  (this is 3 over the counter pills) four times a day. Do not drink alcohol or combine with other medications that have acetaminophen as an ingredient (Read the labels!).    For breakthrough pain you may take Flexeril. Do not drink alcohol, drive or operate heavy machinery when taking Flexeril.  Please follow with your primary care doctor in the next 2 days for a check-up. They must obtain records for further management.   Do not hesitate to return to the Emergency Department for any new, worsening or concerning symptoms.

## 2017-08-19 ENCOUNTER — Ambulatory Visit: Payer: BLUE CROSS/BLUE SHIELD | Admitting: Family Medicine

## 2017-08-19 ENCOUNTER — Encounter: Payer: Self-pay | Admitting: Medical

## 2017-08-19 ENCOUNTER — Ambulatory Visit (INDEPENDENT_AMBULATORY_CARE_PROVIDER_SITE_OTHER): Payer: BLUE CROSS/BLUE SHIELD | Admitting: Medical

## 2017-08-19 VITALS — BP 140/80 | HR 76 | Temp 98.9°F | Resp 16 | Ht 66.0 in | Wt 161.6 lb

## 2017-08-19 DIAGNOSIS — B029 Zoster without complications: Secondary | ICD-10-CM | POA: Diagnosis not present

## 2017-08-19 MED ORDER — FAMCICLOVIR 500 MG PO TABS: 500.0000 mg | ORAL_TABLET | Freq: Three times a day (TID) | ORAL | 0 refills | Status: DC

## 2017-08-19 MED ORDER — TRAMADOL HCL 50 MG PO TABS: 50.0000 mg | ORAL_TABLET | Freq: Four times a day (QID) | ORAL | 0 refills | Status: DC | PRN

## 2017-08-19 NOTE — Progress Notes (Signed)
Subjective:    [name], male    DOB: July 28, 1961, 56 y.o.   MRN: [id number]  HPI   Pt in with left side rash under his left pec that spread all the way to thoracic area.(no pain or burning). He just saw the rash on Sunday morning. Actually on further clarification he states his wife so it first.  But since August 17, 2017 he has had some mild pain beneath left scapula shooting to left axillary area. Now his entire left side back feels sore but sharp pain went away. He has some sensitivity to light pressure over the rash region. Initially he states that the pain is very minimal but on further discussion he does admit pain level about 7/10.     Review of Systems  Constitutional: Negative for chills and fatigue.  HENT: Negative for congestion and drooling.   Respiratory: Negative for cough, choking, shortness of breath and wheezing.   Cardiovascular: Negative for chest pain and palpitations.  Gastrointestinal: Negative for abdominal pain, blood in stool, constipation, diarrhea and vomiting.  Musculoskeletal:       Early on he felt he might have some muscular type pain. But then rash appeared and now the pain characteristic is a little bit different with some slight pain with pressure.  Skin: Positive for rash.       Skin mild sensitive.  Neurological: Negative for dizziness, seizures, light-headedness, numbness and headaches.  Hematological: Negative for adenopathy. Does not bruise/bleed easily.  Psychiatric/Behavioral: Negative for behavioral problems, confusion and decreased concentration.    Past Medical History:  Diagnosis Date  . Allergy   . Constipation 12/25/2015  . Dysrhythmia    sinus arrythmia-2006-work up negative findings  . Hyperglycemia 03/15/2015  . Hyperlipemia   . Hypertension   . Leg pain, diffuse, right 04/15/2017  . Preventative health care 03/20/2015   Sees Dr Collene Mares of gastroenterology     Social History   Social History  . Marital  status: Married    Spouse name: Delora  . Number of children: N/A  . Years of education: 54   Occupational History  . 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 Smoking status: Never Smoker  . Smokeless tobacco: Never Used  . Alcohol use No  . Drug use: No  . Sexual activity: Yes     Comment: lives with wife, no major dietary restrictions, works for [employer]   Other Topics Concern  . Not on file   Social History Narrative  . No narrative on file    Past Surgical History:  Procedure Laterality Date  . KNEE ARTHROSCOPY  2009   rt knee  . KNEE ARTHROSCOPY  01/16/2012   Procedure: ARTHROSCOPY KNEE;  Surgeon: Gearlean Alf, MD;  Location: [facility];  Service: Orthopedics;  Laterality: Left;  WITH DEBRIDEMENT  . VENTRAL HERNIA REPAIR      Family History  Problem Relation Age of Onset  . Hypertension Mother   . Diabetes Mother        labile  . Kidney disease Mother   . Peripheral vascular disease Mother   . Diabetes Maternal Aunt   . Heart disease Maternal Aunt   . Heart disease Father        MI?  . Drug abuse Son     No Known Allergies  Current Outpatient Prescriptions on File Prior to Visit  Medication Sig Dispense Refill  . aspirin 81 MG tablet Take  81 mg by mouth daily.    . cyclobenzaprine (FLEXERIL) 10 MG tablet Take 1 tablet (10 mg total) by mouth 2 (two) times daily as needed for muscle spasms. 20 tablet 0  . fish oil-omega-3 fatty acids 1000 MG capsule Take 2 g by mouth daily.    . Garlic 10 MG CAPS Take by mouth.    . hydrochlorothiazide (HYDRODIURIL) 25 MG tablet TAKE ONE TABLET BY MOUTH ONCE DAILY 30 tablet 8  . meloxicam (MOBIC) 7.5 MG tablet Take 1 tablet (7.5 mg total) by mouth 2 (two) times daily as needed for pain. With food 40 tablet 1  . Multiple Vitamin (MULTIVITAMIN) tablet Take 1 tablet by mouth daily.    . Red Yeast Rice 600 MG CAPS Take 1 tablet by mouth daily.    . vitamin E 600 UNIT capsule Take 600 Units by  mouth daily.     No current facility-administered medications on file prior to visit.     BP (!) 149/84   Pulse 76   Temp 98.9 F (37.2 C) (Oral)   Resp 16   Ht 5\' 6"  (1.676 m)   Wt 161 lb 9.6 oz (73.3 kg)   SpO2 100%   BMI 26.08 kg/m       Objective:   Physical Exam  General- No acute distress. Pleasant patient. Neck- Full range of motion, no jvd Lungs- Clear, even and unlabored. Heart- regular rate and rhythm. Neurologic- CNII- XII grossly intact.  Skin- underneath his left pec and wrapping all the way to his mid thoracic area has scattered diffuse vesicular eruption. The rash does not has midline in the anterior or posterior thorax region.(No honey crusting or secondarily infected appearance on inspection.)        Assessment & Plan:  On exam the skin rash appearance and distribution does appear to be shingles.  I prescribed Famvir antiviral medication to start today. Starting the medication today/as soon as possible after outbreak can help decrease chance of post herpetic neuralgia.  For the pain that she described now(7/10 level), will prescribe tramadol.  I will write you a work note for one day.  Note during the day you may have some pain control with low-dose ibuprofen 200-400 mg 3 times daily.  Caution with tramadol driving or operating heavy machinery.  Follow-up in 7-10 days for recheck of rash area.   Patient had concern that some of the pain he had earlier maybe cardiac. For this reason I offered him an EKG for reassurance purposes but he declined EKG. Patient did not report cardiac type symptoms.

## 2017-08-19 NOTE — Patient Instructions (Addendum)
On exam the skin rash appearance and distribution does appear to be shingles.  I prescribed Famvir antiviral medication to start today. Starting the medication today/as soon as possible after outbreak can help decrease chance of post herpetic neuralgia.  For the pain that she described now(7/10 level), will prescribe tramadol.  I will write you a work note for one day.  Note during the day you may have some pain control with low-dose ibuprofen 200-400 mg 3 times daily.  Caution with tramadol driving or operating heavy machinery.  Follow-up in 7-10 days for recheck of rash area.    Shingles Shingles, which is also known as herpes zoster, is an infection that causes a painful skin rash and fluid-filled blisters. Shingles is not related to genital herpes, which is a sexually transmitted infection. Shingles only develops in people who:  Have had chickenpox.  Have received the chickenpox vaccine. (This is rare.)  What are the causes? Shingles is caused by varicella-zoster virus (VZV). This is the same virus that causes chickenpox. After exposure to VZV, the virus stays in the body in an inactive (dormant) state. Shingles develops if the virus reactivates. This can happen many years after the initial exposure to VZV. It is not known what causes this virus to reactivate. What increases the risk? People who have had chickenpox or received the chickenpox vaccine are at risk for shingles. Infection is more common in people who:  Are older than age 81.  Have a weakened defense (immune) system, such as those with HIV, AIDS, or cancer.  Are taking medicines that weaken the immune system, such as transplant medicines.  Are under great stress.  What are the signs or symptoms? Early symptoms of this condition include itching, tingling, and pain in an area on your skin. Pain may be described as burning, stabbing, or throbbing. A few days or weeks after symptoms start, a painful red rash appears,  usually on one side of the body in a bandlike or beltlike pattern. The rash eventually turns into fluid-filled blisters that break open, scab over, and dry up in about 2-3 weeks. At any time during the infection, you may also develop:  A fever.  Chills.  A headache.  An upset stomach.  How is this diagnosed? This condition is diagnosed with a skin exam. Sometimes, skin or fluid samples are taken from the blisters before a diagnosis is made. These samples are examined under a microscope or sent to a lab for testing. How is this treated? There is no specific cure for this condition. Your health care provider will probably prescribe medicines to help you manage pain, recover more quickly, and avoid long-term problems. Medicines may include:  Antiviral drugs.  Anti-inflammatory drugs.  Pain medicines.  If the area involved is on your face, you may be referred to a specialist, such as an eye doctor (ophthalmologist) or an ear, nose, and throat (ENT) doctor to help you avoid eye problems, chronic pain, or disability. Follow these instructions at home: Medicines  Take medicines only as directed by your health care provider.  Apply an anti-itch or numbing cream to the affected area as directed by your health care provider. Blister and Rash Care  Take a cool bath or apply cool compresses to the area of the rash or blisters as directed by your health care provider. This may help with pain and itching.  Keep your rash covered with a loose bandage (dressing). Wear loose-fitting clothing to help ease the pain of material rubbing  against the rash.  Keep your rash and blisters clean with mild soap and cool water or as directed by your health care provider.  Check your rash every day for signs of infection. These include redness, swelling, and pain that lasts or increases.  Do not pick your blisters.  Do not scratch your rash. General instructions  Rest as directed by your health care  provider.  Keep all follow-up visits as directed by your health care provider. This is important.  Until your blisters scab over, your infection can cause chickenpox in people who have never had it or been vaccinated against it. To prevent this from happening, avoid contact with other people, especially: ? Babies. ? Pregnant women. ? Children who have eczema. ? Elderly people who have transplants. ? People who have chronic illnesses, such as leukemia or AIDS. Contact a health care provider if:  Your pain is not relieved with prescribed medicines.  Your pain does not get better after the rash heals.  Your rash looks infected. Signs of infection include redness, swelling, and pain that lasts or increases. Get help right away if:  The rash is on your face or nose.  You have facial pain, pain around your eye area, or loss of feeling on one side of your face.  You have ear pain or you have ringing in your ear.  You have loss of taste.  Your condition gets worse. This information is not intended to replace advice given to you by your health care provider. Make sure you discuss any questions you have with your health care provider. Document Released: 11/12/2005 Document Revised: 07/08/2016 Document Reviewed: 09/23/2014 Elsevier Interactive Patient Education  2017 [employer].

## 2017-08-20 ENCOUNTER — Ambulatory Visit: Payer: BLUE CROSS/BLUE SHIELD | Admitting: Medical

## 2017-08-26 ENCOUNTER — Encounter: Payer: Self-pay | Admitting: Medical

## 2017-08-26 ENCOUNTER — Ambulatory Visit: Payer: BLUE CROSS/BLUE SHIELD | Admitting: Family Medicine

## 2017-08-26 ENCOUNTER — Ambulatory Visit (INDEPENDENT_AMBULATORY_CARE_PROVIDER_SITE_OTHER): Payer: BLUE CROSS/BLUE SHIELD | Admitting: Medical

## 2017-08-26 VITALS — BP 153/80 | HR 73 | Temp 98.3°F | Resp 16 | Ht 66.0 in | Wt 160.4 lb

## 2017-08-26 DIAGNOSIS — M792 Neuralgia and neuritis, unspecified: Secondary | ICD-10-CM

## 2017-08-26 DIAGNOSIS — B029 Zoster without complications: Secondary | ICD-10-CM

## 2017-08-26 MED ORDER — GABAPENTIN 100 MG PO CAPS: 100.0000 mg | ORAL_CAPSULE | Freq: Three times a day (TID) | ORAL | 3 refills | Status: DC

## 2017-08-26 MED ORDER — HYDROCODONE-ACETAMINOPHEN 5-325 MG PO TABS: 1.0000 | ORAL_TABLET | Freq: Four times a day (QID) | ORAL | 0 refills | Status: DC | PRN

## 2017-08-26 MED ORDER — FAMCICLOVIR 500 MG PO TABS: 500.0000 mg | ORAL_TABLET | Freq: Three times a day (TID) | ORAL | 0 refills | Status: DC

## 2017-08-26 NOTE — Progress Notes (Signed)
Subjective:    [name], male    DOB: [birth year], 56 y.o.   MRN: [id number]  HPI  Pt in with some persisting pain in left side /thorax in region of shingles. See prior note and distribution.   I placed pt on famvir and made tramadol available for neuropathic pain.  Pt just finished famvir. Pain level is 7/10.   Pt did not think tramadol was adequate for pain control. Also made him little drowsy. 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          Review of Systems  Constitutional: Negative for chills and fatigue.  Respiratory: Negative for cough, chest tightness, shortness of breath and wheezing.   Cardiovascular: Negative for chest pain and palpitations.  Gastrointestinal: Negative for abdominal pain.  Genitourinary: Negative for dysuria and flank pain.  Musculoskeletal: Negative for back pain.  Skin: Positive for rash.       Skin rash. See past hpi and this hpi visit. Dermatome distribution of rash.  Neurological: Negative for dizziness, syncope, speech difficulty, weakness, numbness and headaches.       Neuropathic pain moderate to severe.  Hematological: Negative for adenopathy. Does not bruise/bleed easily.  Psychiatric/Behavioral: Negative for behavioral problems, confusion and suicidal ideas. The patient is not nervous/anxious and is not hyperactive.     Past Medical History:  Diagnosis Date  . Allergy   . Constipation 12/25/2015  . Dysrhythmia    sinus arrythmia-2006-work up negative findings  . Hyperglycemia 03/15/2015  . Hyperlipemia   . Hypertension   . Leg pain, diffuse, right 04/15/2017  . Preventative health care 03/20/2015   Sees Dr Collene Mares of gastroenterology     Social History   Social History  . Marital status: Married    Spouse name: Delora  . Number of children: N/A    . Years of education: 5   Occupational History  . 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 Smoking status: Never Smoker  . Smokeless tobacco: Never Used  . Alcohol use No  . Drug use: No  . Sexual activity: Yes     Comment: lives with wife, no major dietary restrictions, works for [employer]   Other Topics Concern  . Not on file   Social History Narrative  . No narrative on file    Past Surgical History:  Procedure Laterality Date  . KNEE ARTHROSCOPY  2009   rt knee  . KNEE ARTHROSCOPY  01/16/2012   Procedure: ARTHROSCOPY KNEE;  Surgeon: Gearlean Alf, MD;  [facility];  Service: Orthopedics;  Laterality: Left;  WITH DEBRIDEMENT  . VENTRAL HERNIA REPAIR      Family History  Problem Relation Age of Onset  . Hypertension Mother   . Diabetes Mother        labile  . Kidney disease Mother   . Peripheral vascular disease Mother   . Diabetes Maternal Aunt   . Heart disease Maternal Aunt   . Heart disease Father        MI?  . Drug abuse Son     No Known Allergies  Current Outpatient Prescriptions on File Prior to Visit  Medication Sig Dispense Refill  . aspirin 81 MG tablet Take 81 mg by mouth daily.    Marland Kitchen  cyclobenzaprine (FLEXERIL) 10 MG tablet Take 1 tablet (10 mg total) by mouth 2 (two) times daily as needed for muscle spasms. 20 tablet 0  . famciclovir (FAMVIR) 500 MG tablet Take 1 tablet (500 mg total) by mouth 3 (three) times daily. 21 tablet 0  . fish oil-omega-3 fatty acids 1000 MG capsule Take 2 g by mouth daily.    . Garlic 10 MG CAPS Take by mouth.    . hydrochlorothiazide (HYDRODIURIL) 25 MG tablet TAKE ONE TABLET BY MOUTH ONCE DAILY 30 tablet 8  . meloxicam (MOBIC) 7.5 MG tablet Take 1 tablet (7.5 mg total) by mouth 2 (two) times daily as needed for pain. With food 40 tablet 1  . Multiple Vitamin (MULTIVITAMIN) tablet Take 1 tablet by mouth daily.    . Red Yeast Rice 600 MG CAPS Take 1 tablet by mouth daily.    .  traMADol (ULTRAM) 50 MG tablet Take 1 tablet (50 mg total) by mouth every 6 (six) hours as needed for moderate pain. 16 tablet 0  . vitamin E 600 UNIT capsule Take 600 Units by mouth daily.     No current facility-administered medications on file prior to visit.     BP (!) 155/100   Pulse 73   Temp 98.3 F (36.8 C) (Oral)   Resp 16   Ht 5\' 6"  (1.676 m)   Wt 160 lb 6.4 oz (72.8 kg)   SpO2 100%   BMI 25.89 kg/m       Objective:   Physical Exam  General- No acute distress. Pleasant patient. Neck- Full range of motion, no jvd Lungs- Clear, even and unlabored. Heart- regular rate and rhythm. Neurologic- CNII- XII grossly intact.  Skin- underneath his left pec and wrapping all the way to his mid thoracic area has scattered diffuse vesicular eruption. The rash does not passmidline in the anterior or posterior thorax region.(No honey crusting or secondarily infected appearance on inspection.). The vesicles do look more prominent now.      Assessment & Plan:  For your shingles and nerve pain, I am prescribing gabapentin, norco and extending famvir rx.  Rx advisement given.  Hopefully you won't get persistent nerve pain.(post herpetic neuralgia)  Follow up in 7-10 days or as needed.  Since driving is issue with sedating pain meds and driving can increase pain will fill out short term disability forms.   Dr. Charlett Blake pt pcp did come in briefly and talk with pt as well.  Note also bp high today but he has been off medication and he just got his prescription today. Will restart today. Murrel Bertram, Percell Miller, PA-C

## 2017-08-26 NOTE — Patient Instructions (Signed)
For your shingles and nerve pain, I am prescribing gabapentin, norco  and extending famvir rx.  Rx advisement given.  Hopefully you won't get persistent nerve pain.(post herpetic neuralgia)  Follow up in 7-10 days or as needed.  Since driving is issue with sedating pain meds and driving can increase pain will fill out short term disability forms.

## 2017-08-29 ENCOUNTER — Telehealth: Payer: Self-pay | Admitting: *Deleted

## 2017-08-29 NOTE — Telephone Encounter (Signed)
Received FMLA/STD paperwork from [employer], completed as much as possible; forwarded to provider with attached office notes/SLS 10/04

## 2017-09-02 ENCOUNTER — Other Ambulatory Visit: Payer: Self-pay | Admitting: Family Medicine

## 2017-09-02 MED ORDER — HYDROCODONE-ACETAMINOPHEN 5-325 MG PO TABS: 1.0000 | ORAL_TABLET | Freq: Four times a day (QID) | ORAL | 0 refills | Status: DC | PRN

## 2017-09-02 NOTE — Telephone Encounter (Signed)
OK to give a refill on the Hydrocodone for his shingles pain, same strength, same sig, same number. Needs a uds and contract though

## 2017-09-02 NOTE — Telephone Encounter (Signed)
Requesting:Norco Contract:No UDS:No Last OV: 08/26/17 W/Edward  Next OV:09/09/17 W/Edward Last Refill:08/26/17   #[id number]   Please advise

## 2017-09-02 NOTE — Telephone Encounter (Addendum)
°  Relation to OE:VOJJ  Call back [phone]   Reason for call:  Patient requesting a refill HYDROcodone-acetaminophen (NORCO) 5-325 MG tablet

## 2017-09-03 ENCOUNTER — Other Ambulatory Visit: Payer: BLUE CROSS/BLUE SHIELD

## 2017-09-03 ENCOUNTER — Telehealth: Payer: Self-pay | Admitting: Medical

## 2017-09-03 DIAGNOSIS — R52 Pain, unspecified: Secondary | ICD-10-CM

## 2017-09-03 DIAGNOSIS — Z79899 Other long term (current) drug therapy: Secondary | ICD-10-CM

## 2017-09-03 NOTE — Telephone Encounter (Signed)
Patient notified of rx, contract & UDS

## 2017-09-03 NOTE — Telephone Encounter (Signed)
I have the [employer] disability form but I need to see pt and see what level pain he is in. If he feels he can't work/drive then I need to provide estimated date in future but need to evaluate his progress. Can you schedule pt for Friday at 1 pm in afternoon.

## 2017-09-04 ENCOUNTER — Other Ambulatory Visit: Payer: Self-pay | Admitting: Medical

## 2017-09-04 NOTE — Telephone Encounter (Signed)
[place] Shannahan Spouse [phone]  [facility] [place]   famciclovir (FAMVIR) 500 MG tablet   Delora called to say that Jamieson needs a refill on the above medicine, and she also stated that his shingles was no better.

## 2017-09-04 NOTE — Telephone Encounter (Signed)
lvm advising patient of message below °

## 2017-09-04 NOTE — Telephone Encounter (Signed)
Spoke with patient via telephone and R/S his appt on Monday [2-wk F/U] to Friday, 09/06/17 at 1:00pm per provider instructions; made 30-minute appointment to complete Disability paperwork as well/SLS 10/10

## 2017-09-05 NOTE — Telephone Encounter (Signed)
°  Relation to FO:YDXA Call back number:[phone]   Reason for call:  Spouse checking on the status of message below, pleas advise

## 2017-09-05 NOTE — Telephone Encounter (Signed)
Called patients spouse unable to leave vmail. Mailbox was full    Per/ Tommy Farrell and Dr.Lowne the medication they are requesting will not help the situation because it has been too far out. Patient has appt with Tommy Farrell on 10/12 he will take a look at it then to come up with a new plan.     pc

## 2017-09-06 ENCOUNTER — Ambulatory Visit (INDEPENDENT_AMBULATORY_CARE_PROVIDER_SITE_OTHER): Payer: BLUE CROSS/BLUE SHIELD | Admitting: Medical

## 2017-09-06 ENCOUNTER — Encounter: Payer: Self-pay | Admitting: Medical

## 2017-09-06 VITALS — BP 140/83 | HR 82 | Temp 98.1°F | Ht 66.0 in | Wt 162.0 lb

## 2017-09-06 DIAGNOSIS — G629 Polyneuropathy, unspecified: Secondary | ICD-10-CM

## 2017-09-06 DIAGNOSIS — B028 Zoster with other complications: Secondary | ICD-10-CM | POA: Diagnosis not present

## 2017-09-06 MED ORDER — HYDROCODONE-ACETAMINOPHEN 5-325 MG PO TABS: 1.0000 | ORAL_TABLET | Freq: Four times a day (QID) | ORAL | 0 refills | Status: DC | PRN

## 2017-09-06 NOTE — Progress Notes (Addendum)
Subjective:    [name], male    DOB: [birth year], 56 y.o.   MRN: [id number]  HPI  Pt in with no new vesicles but pain still come and goes. Still sharp pain at times. Not as painful as before. No new lesions.  Pt states if leans back and moves has sharp. Pain 6.5/10. At rest some constant pain but not as severe.  Pt is on neurontin twice a day.  Pt finished his antiviral.  Dr. Charlett Blake rx'd norco.    Review of Systems  Constitutional: Negative for chills, fatigue and fever.  Respiratory: Negative for cough, chest tightness, shortness of breath and wheezing.   Cardiovascular: Negative for chest pain and palpitations.  Gastrointestinal: Negative for abdominal pain, blood in stool, diarrhea, nausea and vomiting.  Musculoskeletal: Negative for back pain, joint swelling and neck stiffness.  Skin: Negative for rash.  Neurological: Negative for dizziness and headaches.       Still has severe nerve pain over shingles eruption area.  Hematological: Negative for adenopathy. Does not bruise/bleed easily.  Psychiatric/Behavioral: Negative for behavioral problems, confusion, self-injury and suicidal ideas.    Past Medical History:  Diagnosis Date  . Allergy   . Constipation 12/25/2015  . Dysrhythmia    sinus arrythmia-2006-work up negative findings  . Hyperglycemia 03/15/2015  . Hyperlipemia   . Hypertension   . Leg pain, diffuse, right 04/15/2017  . Preventative health care 03/20/2015   Sees Dr Collene Mares of gastroenterology     Social History   Social History  . Marital status: Married    Spouse name: Delora  . Number of children: N/A  . Years of education: 64   Occupational History  . 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 Smoking status: Never Smoker  . Smokeless tobacco: Never Used  . Alcohol use No  . Drug use: No  . Sexual activity: Yes     Comment: lives with wife, no major dietary restrictions, works for [employer]   Other Topics  Concern  . Not on file   Social History Narrative  . No narrative on file    Past Surgical History:  Procedure Laterality Date  . KNEE ARTHROSCOPY  2009   rt knee  . KNEE ARTHROSCOPY  01/16/2012   Procedure: ARTHROSCOPY KNEE;  Surgeon: Gearlean Alf, MD;  [facility]: [facility];  Service: Orthopedics;  Laterality: Left;  WITH DEBRIDEMENT  . VENTRAL HERNIA REPAIR      Family History  Problem Relation Age of Onset  . Hypertension Mother   . Diabetes Mother        labile  . Kidney disease Mother   . Peripheral vascular disease Mother   . Diabetes Maternal Aunt   . Heart disease Maternal Aunt   . Heart disease Father        MI?  . Drug abuse Son     No Known Allergies  Current Outpatient Prescriptions on File Prior to Visit  Medication Sig Dispense Refill  . aspirin 81 MG tablet Take 81 mg by mouth daily.    . cyclobenzaprine (FLEXERIL) 10 MG tablet Take 1 tablet (10 mg total) by mouth 2 (two) times daily as needed for muscle spasms. 20 tablet 0  . famciclovir (FAMVIR) 500 MG tablet Take 1 tablet (500 mg total) by mouth 3 (three) times daily. 21 tablet 0  . fish oil-omega-3 fatty acids 1000 MG capsule Take 2 g by mouth daily.    Marland Kitchen  gabapentin (NEURONTIN) 100 MG capsule Take 1 capsule (100 mg total) by mouth 3 (three) times daily. 90 capsule 3  . Garlic 10 MG CAPS Take by mouth.    . hydrochlorothiazide (HYDRODIURIL) 25 MG tablet TAKE ONE TABLET BY MOUTH ONCE DAILY 30 tablet 8  . HYDROcodone-acetaminophen (NORCO) 5-325 MG tablet Take 1 tablet by mouth every 6 (six) hours as needed for moderate pain. 20 tablet 0  . meloxicam (MOBIC) 7.5 MG tablet Take 1 tablet (7.5 mg total) by mouth 2 (two) times daily as needed for pain. With food 40 tablet 1  . Multiple Vitamin (MULTIVITAMIN) tablet Take 1 tablet by mouth daily.    . Red Yeast Rice 600 MG CAPS Take 1 tablet by mouth daily.    . traMADol (ULTRAM) 50 MG tablet Take 1 tablet (50 mg total) by mouth every 6 (six)  hours as needed for moderate pain. 16 tablet 0  . vitamin E 600 UNIT capsule Take 600 Units by mouth daily.     No current facility-administered medications on file prior to visit.     BP 140/83   Pulse 82   Temp 98.1 F (36.7 C) (Oral)   Ht 5\' 6"  (1.676 m)   Wt 162 lb (73.5 kg)   BMI 26.15 kg/m       Objective:   Physical Exam  General- No acute distress. Pleasant patient. Neurologic- CNII- XII grossly intact.  Skin-  Left side rib area. Appearance of scarred vesicles/area is dry now. Now no vesicles.(skin is very tender to light touch) Areas of healing lesions covers diffuse area. Left side thorax to left side of chest.        Assessment & Plan:  Your shingle outbreak region looks like it is healing and I do not see any new outbreak areas. This is a positive finding. No further antiviral medication needed. For your nerve pain would recommend that she would advance the Neurontin to 1 tablet 3 times daily. I will refill your Norco to use as needed. DC the form of prescription of tramadol.  I would wait 1 more week and then apply lidocaine patch(salonpas) to the most tender area. Hopefully this will provide release of nerve pain and lessen the need to use the narcotic.  I'm going to fill out your form and state unable to release you to work due to the nerve pain and sedation side effects of your meds. 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  Follow-up in 3 weeks or as needed.  Print rx of norco given. Pt had limited rx from Dr. Charlett Blake recently. Gave new rx. They were made away not to fill until tomorrow at earliest. Wife states will until he needs.  Signed formed given to Ivin Booty to fax.

## 2017-09-06 NOTE — Telephone Encounter (Signed)
Patient had appointment today, provider completed OV note; added note to previous paperwork and faxed to [place] with confirmation/SLS 10/12

## 2017-09-06 NOTE — Patient Instructions (Addendum)
Your shingle outbreak region looks like it is healing and I do not see any new outbreak areas. This is a positive finding. No further antiviral medication needed. For your nerve pain would recommend that she would advance the Neurontin to 1 tablet 3 times daily. I will refill your Norco to use as needed. DC the form of prescription of tramadol.  I would wait 1 more week and then apply lidocaine patch(salonpas) to the most tender area. Hopefully this will provide release of nerve pain and lessen the need to use the narcotic.  I'm going to fill out your form and state unable to release you to work due to the nerve pain and sedation side effects of your meds. 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  Follow-up in 3 weeks or as needed.

## 2017-09-08 LAB — PAIN MGMT, PROFILE 8 W/CONF, U
6 ACETYLMORPHINE: NEGATIVE ng/mL (ref <10)
AMPHETAMINES: NEGATIVE ng/mL (ref <500)
Alcohol Metabolites: NEGATIVE ng/mL (ref <500)
Benzodiazepines: NEGATIVE ng/mL (ref <100)
Buprenorphine, Urine: NEGATIVE ng/mL (ref <5)
COCAINE METABOLITE: NEGATIVE ng/mL (ref <150)
CODEINE: NEGATIVE ng/mL (ref <50)
CREATININE: 82.2 mg/dL
Hydrocodone: NEGATIVE ng/mL (ref <50)
Hydromorphone: 151 ng/mL — ABNORMAL HIGH (ref <50)
MDMA: NEGATIVE ng/mL (ref <500)
Marijuana Metabolite: NEGATIVE ng/mL (ref <20)
Morphine: NEGATIVE ng/mL (ref <50)
NORHYDROCODONE: 51 ng/mL — AB (ref <50)
OPIATES: POSITIVE ng/mL — AB (ref <100)
OXYCODONE: NEGATIVE ng/mL (ref <100)
Oxidant: NEGATIVE ug/mL (ref <200)
PH: 7.15 (ref 4.5–9.0)

## 2017-09-09 ENCOUNTER — Ambulatory Visit: Payer: BLUE CROSS/BLUE SHIELD | Admitting: Medical

## 2017-09-17 ENCOUNTER — Other Ambulatory Visit: Payer: Self-pay | Admitting: Family Medicine

## 2017-09-17 NOTE — Telephone Encounter (Signed)
Caller name:Delora Relation to UY:[id number] Call back [place]:  Reason for call: pt is needing rx hydrocodone, please call when available and ready for pick up.

## 2017-09-18 MED ORDER — HYDROCODONE-ACETAMINOPHEN 5-325 MG PO TABS: 1.0000 | ORAL_TABLET | Freq: Four times a day (QID) | ORAL | 0 refills | Status: DC | PRN

## 2017-09-18 NOTE — Telephone Encounter (Signed)
Requesting:Norco Contract:yes VAN:VBTY scrn 03/14/18 Last OV:09/06/17 Next OV:10/14/17 Last Refill:09/06/17   #[id number]   Please advise

## 2017-09-19 NOTE — Telephone Encounter (Signed)
Patient notified

## 2017-09-20 ENCOUNTER — Telehealth: Payer: Self-pay | Admitting: Family Medicine

## 2017-09-20 NOTE — Telephone Encounter (Signed)
Pt dropped off document to be filled out by provider (Ada 2 pages ) Pt would like to be called at tel [phone] when ready. Document put at front office tray.

## 2017-09-23 ENCOUNTER — Other Ambulatory Visit: Payer: Self-pay | Admitting: Family Medicine

## 2017-09-24 NOTE — Telephone Encounter (Signed)
Completed as much as possible; forwarded to provider [Edward]/SLS 10/30

## 2017-09-27 ENCOUNTER — Telehealth: Payer: Self-pay | Admitting: Medical

## 2017-09-27 NOTE — Telephone Encounter (Signed)
I filled out pt form. But please make sure he keeps follow up appointment with Dr Charlett Blake on 10-14-2017.

## 2017-10-03 NOTE — Telephone Encounter (Signed)
Pt still has appointment scheduled with Dr. Charlett Blake.

## 2017-10-14 ENCOUNTER — Encounter: Payer: Self-pay | Admitting: Family Medicine

## 2017-10-14 ENCOUNTER — Ambulatory Visit (INDEPENDENT_AMBULATORY_CARE_PROVIDER_SITE_OTHER): Payer: BLUE CROSS/BLUE SHIELD | Admitting: Family Medicine

## 2017-10-14 DIAGNOSIS — I10 Essential (primary) hypertension: Secondary | ICD-10-CM

## 2017-10-14 DIAGNOSIS — E782 Mixed hyperlipidemia: Secondary | ICD-10-CM | POA: Diagnosis not present

## 2017-10-14 DIAGNOSIS — R739 Hyperglycemia, unspecified: Secondary | ICD-10-CM

## 2017-10-14 LAB — LIPID PANEL
CHOL/HDL RATIO: 4
Cholesterol: 215 mg/dL — ABNORMAL HIGH (ref 0–200)
HDL: 52.8 mg/dL (ref >39.00)
LDL Cholesterol: 148 mg/dL — ABNORMAL HIGH (ref 0–99)
NonHDL: 162.67
TRIGLYCERIDES: 71 mg/dL (ref 0.0–149.0)
VLDL: 14.2 mg/dL (ref 0.0–40.0)

## 2017-10-14 LAB — CBC
HEMATOCRIT: 45.9 % (ref 39.0–52.0)
HEMOGLOBIN: 15.3 g/dL (ref 13.0–17.0)
MCHC: 33.4 g/dL (ref 30.0–36.0)
MCV: 89.2 fl (ref 78.0–100.0)
Platelets: 225 10*3/uL (ref 150.0–400.0)
RBC: 5.15 Mil/uL (ref 4.22–5.81)
RDW: 14.9 % (ref 11.5–15.5)
WBC: 5.1 10*3/uL (ref 4.0–10.5)

## 2017-10-14 LAB — COMPREHENSIVE METABOLIC PANEL
ALT: 16 U/L (ref 0–53)
AST: 15 U/L (ref 0–37)
Albumin: 4.1 g/dL (ref 3.5–5.2)
Alkaline Phosphatase: 48 U/L (ref 39–117)
BILIRUBIN TOTAL: 0.4 mg/dL (ref 0.2–1.2)
BUN: 15 mg/dL (ref 6–23)
CALCIUM: 9.6 mg/dL (ref 8.4–10.5)
CHLORIDE: 102 meq/L (ref 96–112)
CO2: 32 meq/L (ref 19–32)
Creatinine, Ser: 0.99 mg/dL (ref 0.40–1.50)
GFR: 99.72 mL/min (ref >60.00)
Glucose, Bld: 110 mg/dL — ABNORMAL HIGH (ref 70–99)
POTASSIUM: 3.6 meq/L (ref 3.5–5.1)
Sodium: 141 mEq/L (ref 135–145)
Total Protein: 7.2 g/dL (ref 6.0–8.3)

## 2017-10-14 LAB — TSH: TSH: 1.15 u[IU]/mL (ref 0.35–4.50)

## 2017-10-14 LAB — HEMOGLOBIN A1C: Hgb A1c MFr Bld: 5.7 % (ref 4.6–6.5)

## 2017-10-14 NOTE — Assessment & Plan Note (Signed)
Encouraged heart healthy diet, increase exercise, avoid trans fats, consider a krill oil cap daily 

## 2017-10-14 NOTE — Patient Instructions (Addendum)

## 2017-10-14 NOTE — Assessment & Plan Note (Signed)
Well controlled, no changes to meds. Encouraged heart healthy diet such as the DASH diet and exercise as tolerated.  °

## 2017-10-14 NOTE — Progress Notes (Signed)
Subjective:  I acted as a Education administrator for [employer]. Tommy Farrell, [name]   [name], male    DOB: 11-20-1961, 56 y.o.   MRN: [id number]  Chief Complaint  Patient presents with  . Follow-up    HPI  Patient is in today for follow up visit and is accompanied by his wife. He denies any recent illness or hospitalizations. He continues to try and maintain a heart healthy diet and to exercise regularly. He declines the flu shot but does agree he will go to the pharmacy to get the new shingles shot since we do not have it available. He declined it previously but then got the shingles. His PSA has risen above 4 so his urologist is going to repeat labs in 3 months. Denies CP/palp/SOB/HA/congestion/fevers/GI or GU c/o. Taking meds as prescribed  Patient Care Team: Mosie Lukes, MD as PCP - General (Family Medicine) Alexis Frock, MD as Consulting Physician (Urology)   Past Medical History:  Diagnosis Date  . Allergy   . Constipation 12/25/2015  . Dysrhythmia    sinus arrythmia-2006-work up negative findings  . Hyperglycemia 03/15/2015  . Hyperlipemia   . Hypertension   . Leg pain, diffuse, right 04/15/2017  . Preventative health care 03/20/2015   Sees Dr Collene Mares of gastroenterology    Past Surgical History:  Procedure Laterality Date  . ARTHROSCOPY KNEE Left 01/16/2012   Performed by Gearlean Alf, MD at [place] ARTHROSCOPY  2009   rt knee  . VENTRAL HERNIA REPAIR      Family History  Problem Relation Age of Onset  . Hypertension Mother   . Diabetes Mother        labile  . Kidney disease Mother   . Peripheral vascular disease Mother   . Diabetes Maternal Aunt   . Heart disease Maternal Aunt   . Heart disease Father        MI?  . Drug abuse Son     Social History   Socioeconomic History  . Marital status: Married    Spouse name: Tommy Farrell  . Number of children: Not on file  . Years of education: 59  . Highest education level: Not on  file  Social Needs  . Financial resource strain: Not on file  . Food insecurity - worry: Not on file  . Food insecurity - inability: Not on file  . Transportation needs - medical: Not on file  . Transportation needs - non-medical: Not on file  Occupational History  . Occupation: Administrator for [employer]  . Smoking status: Never Smoker  . Smokeless tobacco: Never Used  Substance and Sexual Activity  . Alcohol use: No  . Drug use: No  . Sexual activity: Yes    Comment: lives with wife, no major dietary restrictions, works for [employer]  Other Topics Concern  . Not on file  Social History Narrative  . Not on file    Outpatient Medications Prior to Visit  Medication Sig Dispense Refill  . aspirin 81 MG tablet Take 81 mg by mouth daily.    . cyclobenzaprine (FLEXERIL) 10 MG tablet Take 1 tablet (10 mg total) by mouth 2 (two) times daily as needed for muscle spasms. 20 tablet 0  . famciclovir (FAMVIR) 500 MG tablet Take 1 tablet (500 mg total) by mouth 3 (three) times daily. 21 tablet 0  . fish oil-omega-3 fatty acids 1000 MG capsule Take 2 g by mouth  daily.    . gabapentin (NEURONTIN) 100 MG capsule Take 1 capsule (100 mg total) by mouth 3 (three) times daily. 90 capsule 3  . Garlic 10 MG CAPS Take by mouth.    . hydrochlorothiazide (HYDRODIURIL) 25 MG tablet TAKE ONE TABLET BY MOUTH ONCE DAILY 30 tablet 8  . HYDROcodone-acetaminophen (NORCO) 5-325 MG tablet Take 1 tablet by mouth every 6 (six) hours as needed for moderate pain. 20 tablet 0  . meloxicam (MOBIC) 7.5 MG tablet Take 1 tablet (7.5 mg total) by mouth 2 (two) times daily as needed for pain. With food 40 tablet 1  . Multiple Vitamin (MULTIVITAMIN) tablet Take 1 tablet by mouth daily.    . Red Yeast Rice 600 MG CAPS Take 1 tablet by mouth daily.    . traMADol (ULTRAM) 50 MG tablet Take 1 tablet (50 mg total) by mouth every 6 (six) hours as needed for moderate pain. 16 tablet 0  . vitamin E 600 UNIT capsule Take 600  Units by mouth daily.     No facility-administered medications prior to visit.     No Known Allergies  Review of Systems  Constitutional: Negative for fever and malaise/fatigue.  HENT: Negative for congestion.   Eyes: Negative for blurred vision.  Respiratory: Negative for shortness of breath.   Cardiovascular: Negative for chest pain, palpitations and leg swelling.  Gastrointestinal: Negative for abdominal pain, blood in stool and nausea.  Genitourinary: Negative for dysuria and frequency.  Musculoskeletal: Negative for falls.  Skin: Negative for rash.  Neurological: Negative for dizziness, loss of consciousness and headaches.  Endo/Heme/Allergies: Negative for environmental allergies.  Psychiatric/Behavioral: Negative for depression. The patient is not nervous/anxious.        Objective:    Physical Exam  Constitutional: He is oriented to person, place, and time. He appears well-developed and well-nourished. No distress.  HENT:  Head: Normocephalic and atraumatic.  Nose: Nose normal.  Eyes: Right eye exhibits no discharge. Left eye exhibits no discharge.  Neck: Normal range of motion. Neck supple.  Cardiovascular: Normal rate and regular rhythm.  No murmur heard. Pulmonary/Chest: Effort normal and breath sounds normal.  Abdominal: Soft. Bowel sounds are normal. There is no tenderness.  Musculoskeletal: He exhibits no edema.  Neurological: He is alert and oriented to person, place, and time.  Skin: Skin is warm and dry.  Psychiatric: He has a normal mood and affect.  Nursing note and vitals reviewed.   BP 130/84   Pulse 85   Temp 98.3 F (36.8 C) (Oral)   Resp 16   Wt 162 lb 12.8 oz (73.8 kg)   SpO2 97%   BMI 26.28 kg/m  Wt Readings from Last 3 Encounters:  10/14/17 162 lb 12.8 oz (73.8 kg)  09/06/17 162 lb (73.5 kg)  08/26/17 160 lb 6.4 oz (72.8 kg)   BP Readings from Last 3 Encounters:  10/14/17 130/84  09/06/17 140/83  08/26/17 (!) 153/80      Immunization History  Administered Date(s) Administered  . Tdap 04/10/2016    Health Maintenance  Topic Date Due  . INFLUENZA VACCINE  06/26/2017  . COLONOSCOPY  11/26/2021  . TETANUS/TDAP  04/10/2026  . Hepatitis C Screening  Completed  . HIV Screening  Completed    Lab Results  Component Value Date   WBC 5.0 08/17/2017   HGB 15.2 08/17/2017   HCT 44.4 08/17/2017   PLT 199 08/17/2017   GLUCOSE 94 08/17/2017   CHOL 206 (H) 04/15/2017   TRIG 60.0  04/15/2017   HDL 53.80 04/15/2017   LDLCALC 140 (H) 04/15/2017   ALT 23 08/17/2017   AST 22 08/17/2017   NA 139 08/17/2017   K 3.9 08/17/2017   CL 103 08/17/2017   CREATININE 1.04 08/17/2017   BUN 24 (H) 08/17/2017   CO2 28 08/17/2017   TSH 1.09 04/15/2017   PSA 3.61 04/15/2017   HGBA1C 5.9 04/15/2017    Lab Results  Component Value Date   TSH 1.09 04/15/2017   Lab Results  Component Value Date   WBC 5.0 08/17/2017   HGB 15.2 08/17/2017   HCT 44.4 08/17/2017   MCV 86.5 08/17/2017   PLT 199 08/17/2017   Lab Results  Component Value Date   NA 139 08/17/2017   K 3.9 08/17/2017   CO2 28 08/17/2017   GLUCOSE 94 08/17/2017   BUN 24 (H) 08/17/2017   CREATININE 1.04 08/17/2017   BILITOT 0.5 08/17/2017   ALKPHOS 51 08/17/2017   AST 22 08/17/2017   ALT 23 08/17/2017   PROT 7.4 08/17/2017   ALBUMIN 4.1 08/17/2017   CALCIUM 9.2 08/17/2017   ANIONGAP 8 08/17/2017   GFR 89.39 04/15/2017   Lab Results  Component Value Date   CHOL 206 (H) 04/15/2017   Lab Results  Component Value Date   HDL 53.80 04/15/2017   Lab Results  Component Value Date   LDLCALC 140 (H) 04/15/2017   Lab Results  Component Value Date   TRIG 60.0 04/15/2017   Lab Results  Component Value Date   CHOLHDL 4 04/15/2017   Lab Results  Component Value Date   HGBA1C 5.9 04/15/2017         Assessment & Plan:   Problem List Items Addressed This Visit    Hyperlipemia    Encouraged heart healthy diet, increase exercise, avoid  trans fats, consider a krill oil cap daily      Relevant Orders   Lipid panel   Hypertension    Well controlled, no changes to meds. Encouraged heart healthy diet such as the DASH diet and exercise as tolerated.       Relevant Orders   CBC   Comprehensive metabolic panel   TSH   Hyperglycemia    hgba1c acceptable, minimize simple carbs. Increase exercise as tolerated.       Relevant Orders   Hemoglobin A1c      I am having Fawn Kirk maintain his Garlic, multivitamin, aspirin, fish oil-omega-3 fatty acids, Red Yeast Rice, vitamin E, meloxicam, cyclobenzaprine, famciclovir, traMADol, gabapentin, HYDROcodone-acetaminophen, and hydrochlorothiazide.  No orders of the defined types were placed in this encounter.   CMA served as Education administrator during this visit. History, Physical and Plan performed by medical provider. Documentation and orders reviewed and attested to.  Penni Homans, MD

## 2017-10-14 NOTE — Assessment & Plan Note (Signed)
hgba1c acceptable, minimize simple carbs. Increase exercise as tolerated.  

## 2017-10-28 ENCOUNTER — Telehealth: Payer: Self-pay | Admitting: *Deleted

## 2017-10-28 NOTE — Telephone Encounter (Signed)
Received Medical records/Office Notes w/physical exam findings and Treatment Plan beginning on [employer]; printed notes and faxed/SLS 12/03

## 2017-12-20 ENCOUNTER — Ambulatory Visit: Payer: BLUE CROSS/BLUE SHIELD | Admitting: Medical

## 2017-12-26 ENCOUNTER — Ambulatory Visit: Payer: BLUE CROSS/BLUE SHIELD | Admitting: Family Medicine

## 2017-12-26 ENCOUNTER — Encounter: Payer: Self-pay | Admitting: Family Medicine

## 2017-12-26 ENCOUNTER — Ambulatory Visit (INDEPENDENT_AMBULATORY_CARE_PROVIDER_SITE_OTHER): Payer: Managed Care, Other (non HMO) | Admitting: Family Medicine

## 2017-12-26 VITALS — BP 153/92 | HR 76 | Temp 98.2°F | Resp 16 | Ht 66.0 in | Wt 168.6 lb

## 2017-12-26 DIAGNOSIS — R739 Hyperglycemia, unspecified: Secondary | ICD-10-CM | POA: Diagnosis not present

## 2017-12-26 DIAGNOSIS — R350 Frequency of micturition: Secondary | ICD-10-CM

## 2017-12-26 DIAGNOSIS — K5900 Constipation, unspecified: Secondary | ICD-10-CM

## 2017-12-26 DIAGNOSIS — E782 Mixed hyperlipidemia: Secondary | ICD-10-CM | POA: Diagnosis not present

## 2017-12-26 DIAGNOSIS — I10 Essential (primary) hypertension: Secondary | ICD-10-CM | POA: Diagnosis not present

## 2017-12-26 LAB — POC URINALSYSI DIPSTICK (AUTOMATED)
Bilirubin, UA: NEGATIVE
Blood, UA: NEGATIVE
Glucose, UA: NEGATIVE
Ketones, UA: NEGATIVE
Leukocytes, UA: NEGATIVE
Nitrite, UA: NEGATIVE
PROTEIN UA: NEGATIVE
UROBILINOGEN UA: 0.2 U/dL
pH, UA: 6 (ref 5.0–8.0)

## 2017-12-26 MED ORDER — LISINOPRIL 10 MG PO TABS: 10.0000 mg | ORAL_TABLET | Freq: Every day | ORAL | 3 refills | Status: DC

## 2017-12-26 NOTE — Patient Instructions (Addendum)
Tums, Zantac or aromatic bitters with clu soda.  Food Choices for Gastroesophageal Reflux Disease, Adult  When you have gastroesophageal reflux disease (GERD), the foods you eat and your eating habits are very important. Choosing the right foods can help ease your discomfort.Avoid drinking alcohol.  Avoid drinking large amounts of liquids with your meals.  Avoid bending over or lying down until 2-3 hours after eating. What foods are not recommended? These are some foods and drinks that may make your symptoms worse: Vegetables Tomatoes. Tomato juice. Tomato and spaghetti sauce. Chili peppers. Onion and garlic. Horseradish. Fruits Oranges, grapefruit, and lemon (fruit and juice). Meats High-fat meats, fish, and poultry. This includes hot dogs, ribs, ham, sausage, salami, and bacon. Dairy Whole milk and chocolate milk. Sour cream. Cream. Butter. Ice cream. Cream cheese. Drinks Coffee and tea. Bubbly (carbonated) drinks or energy drinks. Condiments Hot sauce. Barbecue sauce. Sweets/Desserts Chocolate and cocoa. Donuts. Peppermint and spearmint. Fats and Oils High-fat foods. This includes [place] fries and potato chips. Other Vinegar. Strong spices. This includes black pepper, white pepper, red pepper, cayenne, curry powder, cloves, ginger, and chili powder. The items listed above may not be a complete list of foods and drinks to avoid. Contact your dietitian for more information. This information is not intended to replace advice given to you by your health care provider. Make sure you discuss any questions you have with your health care provider. Document Released: 05/13/2012 Document Revised: 04/19/2016 Document Reviewed: 09/16/2013 Elsevier Interactive Patient Education  2017 [place] DASH stands for "Dietary Approaches to Stop Hypertension." The DASH eating plan is a healthy eating plan that has been shown to reduce high blood pressure (hypertension). It may  also reduce your risk for type 2 diabetes, heart disease, and stroke. The DASH eating plan may also help with weight loss. What are tips for following this plan? General guidelines  Avoid eating more than 2,300 mg (milligrams) of salt (sodium) a day. If you have hypertension, you may need to reduce your sodium intake to 1,500 mg a day.  Limit alcohol intake to no more than 1 drink a day for nonpregnant women and 2 drinks a day for men. One drink equals 12 oz of beer, 5 oz of wine, or 1 oz of hard liquor.  Work with your health care provider to maintain a healthy body weight or to lose weight. Ask what an ideal weight is for you.  Get at least 30 minutes of exercise that causes your heart to beat faster (aerobic exercise) most days of the week. Activities may include walking, swimming, or biking.  Work with your health care provider or diet and nutrition specialist (dietitian) to adjust your eating plan to your individual calorie needs. Reading food labels  Check food labels for the amount of sodium per serving. Choose foods with less than 5 percent of the Daily Value of sodium. Generally, foods with less than 300 mg of sodium per serving fit into this eating plan.  To find whole grains, look for the word "whole" as the first word in the ingredient list. Shopping  Buy products labeled as "low-sodium" or "no salt added."  Buy fresh foods. Avoid canned foods and premade or frozen meals. Cooking  Avoid adding salt when cooking. Use salt-free seasonings or herbs instead of table salt or sea salt. Check with your health care provider or pharmacist before using salt substitutes.  Do not fry foods. Cook foods using healthy methods such as baking, boiling,  grilling, and broiling instead.  Cook with heart-healthy oils, such as olive, canola, soybean, or sunflower oil. Meal planning   Eat a balanced diet that includes: ? 5 or more servings of fruits and vegetables each day. At each meal, try  to fill half of your plate with fruits and vegetables. ? Up to 6-8 servings of whole grains each day. ? Less than 6 oz of lean meat, poultry, or fish each day. A 3-oz serving of meat is about the same size as a deck of cards. One egg equals 1 oz. ? 2 servings of low-fat dairy each day. ? A serving of nuts, seeds, or beans 5 times each week. ? Heart-healthy fats. Healthy fats called Omega-3 fatty acids are found in foods such as flaxseeds and coldwater fish, like sardines, salmon, and mackerel.  Limit how much you eat of the following: ? Canned or prepackaged foods. ? Food that is high in trans fat, such as fried foods. ? Food that is high in saturated fat, such as fatty meat. ? Sweets, desserts, sugary drinks, and other foods with added sugar. ? Full-fat dairy products.  Do not salt foods before eating.  Try to eat at least 2 vegetarian meals each week.  Eat more home-cooked food and less restaurant, buffet, and fast food.  When eating at a restaurant, ask that your food be prepared with less salt or no salt, if possible. What foods are recommended? The items listed may not be a complete list. Talk with your dietitian about what dietary choices are best for you. Grains Whole-grain or whole-wheat bread. Whole-grain or whole-wheat pasta. Brown rice. [place]. Bulgur. Whole-grain and low-sodium cereals. Pita bread. Low-fat, low-sodium crackers. Whole-wheat flour tortillas. Vegetables Fresh or frozen vegetables (raw, steamed, roasted, or grilled). Low-sodium or reduced-sodium tomato and vegetable juice. Low-sodium or reduced-sodium tomato sauce and tomato paste. Low-sodium or reduced-sodium canned vegetables. Fruits All fresh, dried, or frozen fruit. Canned fruit in natural juice (without added sugar). Meat and other protein foods Skinless chicken or [place]. Ground chicken or [place]. Pork with fat trimmed off. Fish and seafood. Egg whites. Dried beans, peas, or lentils. Unsalted  nuts, nut butters, and seeds. Unsalted canned beans. Lean cuts of beef with fat trimmed off. Low-sodium, lean deli meat. Dairy Low-fat (1%) or fat-free (skim) milk. Fat-free, low-fat, or reduced-fat cheeses. Nonfat, low-sodium ricotta or cottage cheese. Low-fat or nonfat yogurt. Low-fat, low-sodium cheese. Fats and oils Soft margarine without trans fats. Vegetable oil. Low-fat, reduced-fat, or light mayonnaise and salad dressings (reduced-sodium). Canola, safflower, olive, soybean, and sunflower oils. Avocado. Seasoning and other foods Herbs. Spices. Seasoning mixes without salt. Unsalted popcorn and pretzels. Fat-free sweets. What foods are not recommended? The items listed may not be a complete list. Talk with your dietitian about what dietary choices are best for you. Grains Baked goods made with fat, such as croissants, muffins, or some breads. Dry pasta or rice meal packs. Vegetables Creamed or fried vegetables. Vegetables in a cheese sauce. Regular canned vegetables (not low-sodium or reduced-sodium). Regular canned tomato sauce and paste (not low-sodium or reduced-sodium). Regular tomato and vegetable juice (not low-sodium or reduced-sodium). Angie Fava. Olives. Fruits Canned fruit in a light or heavy syrup. Fried fruit. Fruit in cream or butter sauce. Meat and other protein foods Fatty cuts of meat. Ribs. Fried meat. Berniece Salines. Sausage. Bologna and other processed lunch meats. Salami. Fatback. Hotdogs. Bratwurst. Salted nuts and seeds. Canned beans with added salt. Canned or smoked fish. Whole eggs or egg yolks. Chicken  or [place] with skin. Dairy Whole or 2% milk, cream, and half-and-half. Whole or full-fat cream cheese. Whole-fat or sweetened yogurt. Full-fat cheese. Nondairy creamers. Whipped toppings. Processed cheese and cheese spreads. Fats and oils Butter. Stick margarine. Lard. Shortening. Ghee. Bacon fat. Tropical oils, such as coconut, palm kernel, or palm oil. Seasoning and other  foods Salted popcorn and pretzels. Onion salt, garlic salt, seasoned salt, table salt, and sea salt. Worcestershire sauce. Tartar sauce. Barbecue sauce. Teriyaki sauce. Soy sauce, including reduced-sodium. Steak sauce. Canned and packaged gravies. Fish sauce. Oyster sauce. Cocktail sauce. Horseradish that you find on the shelf. Ketchup. Mustard. Meat flavorings and tenderizers. Bouillon cubes. Hot sauce and Tabasco sauce. Premade or packaged marinades. Premade or packaged taco seasonings. Relishes. Regular salad dressings. Where to find more information:  National Heart, Lung, and [place]: https://wilson-eaton.com/  American Heart Association: www.heart.org Summary  The DASH eating plan is a healthy eating plan that has been shown to reduce high blood pressure (hypertension). It may also reduce your risk for type 2 diabetes, heart disease, and stroke.  With the DASH eating plan, you should limit salt (sodium) intake to 2,300 mg a day. If you have hypertension, you may need to reduce your sodium intake to 1,500 mg a day.  When on the DASH eating plan, aim to eat more fresh fruits and vegetables, whole grains, lean proteins, low-fat dairy, and heart-healthy fats.  Work with your health care provider or diet and nutrition specialist (dietitian) to adjust your eating plan to your individual calorie needs. This information is not intended to replace advice given to you by your health care provider. Make sure you discuss any questions you have with your health care provider. Document Released: 11/01/2011 Document Revised: 11/05/2016 Document Reviewed: 11/05/2016 Elsevier Interactive Patient Education  [employer].

## 2017-12-29 DIAGNOSIS — R350 Frequency of micturition: Secondary | ICD-10-CM | POA: Insufficient documentation

## 2017-12-29 NOTE — Assessment & Plan Note (Signed)
Encouraged heart healthy diet, increase exercise, avoid trans fats, consider a krill oil cap daily 

## 2017-12-29 NOTE — Assessment & Plan Note (Signed)
hgba1c acceptable, minimize simple carbs. Increase exercise as tolerated.  

## 2017-12-29 NOTE — Progress Notes (Signed)
[name], male   DOB: [birth year], 57 y.o.   MRN: [id number]   Subjective:    [name], male    DOB: 16-Jan-1961, 57 y.o.   MRN: [id number]  Chief Complaint  Patient presents with  . Urinary Frequency    seems to be getting beter, possibly from drinking too much water, started 3 weeks ago    HPI Patient is in today for follow up and is accompanied by his wife. He is complaining of recent increase in urinary frequency. He denies urgency, hematuria of fevers. Denies CP/palp/SOB/HA/congestion/fevers/GI c/o. Taking meds as prescribed  Past Medical History:  Diagnosis Date  . Allergy   . Constipation 12/25/2015  . Dysrhythmia    sinus arrythmia-2006-work up negative findings  . Hyperglycemia 03/15/2015  . Hyperlipemia   . Hypertension   . Leg pain, diffuse, right 04/15/2017  . Preventative health care 03/20/2015   Sees Dr Collene Mares of gastroenterology    Past Surgical History:  Procedure Laterality Date  . KNEE ARTHROSCOPY  2009   rt knee  . KNEE ARTHROSCOPY  01/16/2012   Procedure: ARTHROSCOPY KNEE;  Surgeon: Gearlean Alf, MD;  Location: [facility];  Service: Orthopedics;  Laterality: Left;  WITH DEBRIDEMENT  . VENTRAL HERNIA REPAIR      Family History  Problem Relation Age of Onset  . Hypertension Mother   . Diabetes Mother        labile  . Kidney disease Mother   . Peripheral vascular disease Mother   . Diabetes Maternal Aunt   . Heart disease Maternal Aunt   . Heart disease Father        MI?  . Drug abuse Son     Social History   Socioeconomic History  . Marital status: Married    Spouse name: Delora  . Number of children: Not on file  . Years of education: 6  . Highest education level: Not on file  Social Needs  . Financial resource strain: Not on file  . Food insecurity - worry: Not on file  . Food insecurity - inability: Not on file  . Transportation needs - medical: Not on file  . Transportation needs -  non-medical: Not on file  Occupational History  . Occupation: Administrator for [employer]  . Smoking status: Never Smoker  . Smokeless tobacco: Never Used  Substance and Sexual Activity  . Alcohol use: No  . Drug use: No  . Sexual activity: Yes    Comment: lives with wife, no major dietary restrictions, works for [employer]  Other Topics Concern  . Not on file  Social History Narrative  . Not on file    Outpatient Medications Prior to Visit  Medication Sig Dispense Refill  . aspirin 81 MG tablet Take 81 mg by mouth daily.    . cyclobenzaprine (FLEXERIL) 10 MG tablet Take 1 tablet (10 mg total) by mouth 2 (two) times daily as needed for muscle spasms. 20 tablet 0  . famciclovir (FAMVIR) 500 MG tablet Take 1 tablet (500 mg total) by mouth 3 (three) times daily. 21 tablet 0  . fish oil-omega-3 fatty acids 1000 MG capsule Take 2 g by mouth daily.    Marland Kitchen gabapentin (NEURONTIN) 100 MG capsule Take 1 capsule (100 mg total) by mouth 3 (three) times daily. 90 capsule 3  . Garlic 10 MG CAPS Take by mouth.    . hydrochlorothiazide (HYDRODIURIL) 25 MG tablet TAKE ONE TABLET BY  MOUTH ONCE DAILY 30 tablet 8  . HYDROcodone-acetaminophen (NORCO) 5-325 MG tablet Take 1 tablet by mouth every 6 (six) hours as needed for moderate pain. 20 tablet 0  . meloxicam (MOBIC) 7.5 MG tablet Take 1 tablet (7.5 mg total) by mouth 2 (two) times daily as needed for pain. With food 40 tablet 1  . Multiple Vitamin (MULTIVITAMIN) tablet Take 1 tablet by mouth daily.    . Red Yeast Rice 600 MG CAPS Take 1 tablet by mouth daily.    . traMADol (ULTRAM) 50 MG tablet Take 1 tablet (50 mg total) by mouth every 6 (six) hours as needed for moderate pain. 16 tablet 0  . vitamin E 600 UNIT capsule Take 600 Units by mouth daily.     No facility-administered medications prior to visit.     No Known Allergies  Review of Systems  Constitutional: Negative for fever and malaise/fatigue.  HENT: Negative for congestion.     Eyes: Negative for blurred vision.  Respiratory: Negative for shortness of breath.   Cardiovascular: Negative for chest pain, palpitations and leg swelling.  Gastrointestinal: Negative for abdominal pain, blood in stool and nausea.  Genitourinary: Negative for dysuria and frequency.  Musculoskeletal: Negative for falls.  Skin: Negative for rash.  Neurological: Negative for dizziness, loss of consciousness and headaches.  Endo/Heme/Allergies: Negative for environmental allergies.  Psychiatric/Behavioral: Negative for depression. The patient is not nervous/anxious.        Objective:    Physical Exam  Constitutional: He is oriented to person, place, and time. He appears well-developed and well-nourished. No distress.  HENT:  Head: Normocephalic and atraumatic.  Nose: Nose normal.  Eyes: Right eye exhibits no discharge. Left eye exhibits no discharge.  Neck: Normal range of motion. Neck supple.  Cardiovascular: Normal rate and regular rhythm.  No murmur heard. Pulmonary/Chest: Effort normal and breath sounds normal.  Abdominal: Soft. Bowel sounds are normal. There is no tenderness.  Musculoskeletal: He exhibits no edema.  Neurological: He is alert and oriented to person, place, and time.  Skin: Skin is warm and dry.  Psychiatric: He has a normal mood and affect.  Nursing note and vitals reviewed.   BP (!) 153/92 (BP Location: Right Arm, Patient Position: Sitting, Cuff Size: Normal)   Pulse 76   Temp 98.2 F (36.8 C) (Oral)   Resp 16   Ht 5\' 6"  (1.676 m)   Wt 168 lb 9.6 oz (76.5 kg)   SpO2 99%   BMI 27.21 kg/m  Wt Readings from Last 3 Encounters:  12/26/17 168 lb 9.6 oz (76.5 kg)  10/14/17 162 lb 12.8 oz (73.8 kg)  09/06/17 162 lb (73.5 kg)     Lab Results  Component Value Date   WBC 5.1 10/14/2017   HGB 15.3 10/14/2017   HCT 45.9 10/14/2017   PLT 225.0 10/14/2017   GLUCOSE 110 (H) 10/14/2017   CHOL 215 (H) 10/14/2017   TRIG 71.0 10/14/2017   HDL 52.80  10/14/2017   LDLCALC 148 (H) 10/14/2017   ALT 16 10/14/2017   AST 15 10/14/2017   NA 141 10/14/2017   K 3.6 10/14/2017   CL 102 10/14/2017   CREATININE 0.99 10/14/2017   BUN 15 10/14/2017   CO2 32 10/14/2017   TSH 1.15 10/14/2017   PSA 3.61 04/15/2017   HGBA1C 5.7 10/14/2017    Lab Results  Component Value Date   TSH 1.15 10/14/2017   Lab Results  Component Value Date   WBC 5.1 10/14/2017  HGB 15.3 10/14/2017   HCT 45.9 10/14/2017   MCV 89.2 10/14/2017   PLT 225.0 10/14/2017   Lab Results  Component Value Date   NA 141 10/14/2017   K 3.6 10/14/2017   CO2 32 10/14/2017   GLUCOSE 110 (H) 10/14/2017   BUN 15 10/14/2017   CREATININE 0.99 10/14/2017   BILITOT 0.4 10/14/2017   ALKPHOS 48 10/14/2017   AST 15 10/14/2017   ALT 16 10/14/2017   PROT 7.2 10/14/2017   ALBUMIN 4.1 10/14/2017   CALCIUM 9.6 10/14/2017   ANIONGAP 8 08/17/2017   GFR 99.72 10/14/2017   Lab Results  Component Value Date   CHOL 215 (H) 10/14/2017   Lab Results  Component Value Date   HDL 52.80 10/14/2017   Lab Results  Component Value Date   LDLCALC 148 (H) 10/14/2017   Lab Results  Component Value Date   TRIG 71.0 10/14/2017   Lab Results  Component Value Date   CHOLHDL 4 10/14/2017   Lab Results  Component Value Date   HGBA1C 5.7 10/14/2017       Assessment & Plan:   Problem List Items Addressed This Visit    Hyperlipemia    Encouraged heart healthy diet, increase exercise, avoid trans fats, consider a krill oil cap daily      Relevant Medications   lisinopril (PRINIVIL,ZESTRIL) 10 MG tablet   Hypertension    Poorly controlled will alter medications, encouraged DASH diet, minimize caffeine and obtain adequate sleep. Report concerning symptoms and follow up as directed and as needed. Add Lisinopril 5 mg daily      Relevant Medications   lisinopril (PRINIVIL,ZESTRIL) 10 MG tablet   Hyperglycemia    hgba1c acceptable, minimize simple carbs. Increase exercise as  tolerated      Constipation    Encouraged increased hydration and fiber in diet. Daily probiotics. If bowels not moving can use MOM 2 tbls po in 4 oz of warm prune juice by mouth every 2-3 days. If no results then repeat in 4 hours with  Dulcolax suppository pr, may repeat again in 4 more hours as needed. Seek care if symptoms worsen. Consider daily Miralax and/or Dulcolax if symptoms persist.       Urinary frequency - Primary    No dysuria or hematuria, urinalysis is unremarkable as are other labs, he will let us know if it worsens and consider referral to urology      Relevant Orders   POCT Urinalysis Dipstick (Automated) (Completed)      I am having Fawn Kirk start on lisinopril. I am also having him maintain his Garlic, multivitamin, aspirin, fish oil-omega-3 fatty acids, Red Yeast Rice, vitamin E, meloxicam, cyclobenzaprine, famciclovir, traMADol, gabapentin, HYDROcodone-acetaminophen, and hydrochlorothiazide.  Meds ordered this encounter  Medications  . lisinopril (PRINIVIL,ZESTRIL) 10 MG tablet    Sig: Take 1 tablet (10 mg total) by mouth daily.    Dispense:  30 tablet    Refill:  3     Penni Homans, MD

## 2017-12-29 NOTE — Assessment & Plan Note (Signed)
Poorly controlled will alter medications, encouraged DASH diet, minimize caffeine and obtain adequate sleep. Report concerning symptoms and follow up as directed and as needed. Add Lisinopril 5 mg daily

## 2017-12-29 NOTE — Assessment & Plan Note (Signed)
No dysuria or hematuria, urinalysis is unremarkable as are other labs, he will let us know if it worsens and consider referral to urology

## 2017-12-29 NOTE — Assessment & Plan Note (Signed)
Encouraged increased hydration and fiber in diet. Daily probiotics. If bowels not moving can use MOM 2 tbls po in 4 oz of warm prune juice by mouth every 2-3 days. If no results then repeat in 4 hours with  Dulcolax suppository pr, may repeat again in 4 more hours as needed. Seek care if symptoms worsen. Consider daily Miralax and/or Dulcolax if symptoms persist.  

## 2018-01-22 ENCOUNTER — Ambulatory Visit (INDEPENDENT_AMBULATORY_CARE_PROVIDER_SITE_OTHER): Payer: Managed Care, Other (non HMO) | Admitting: Medical

## 2018-01-22 ENCOUNTER — Other Ambulatory Visit (INDEPENDENT_AMBULATORY_CARE_PROVIDER_SITE_OTHER): Payer: Managed Care, Other (non HMO)

## 2018-01-22 VITALS — BP 131/84 | HR 67 | Resp 14

## 2018-01-22 DIAGNOSIS — I10 Essential (primary) hypertension: Secondary | ICD-10-CM

## 2018-01-22 LAB — COMPREHENSIVE METABOLIC PANEL
ALK PHOS: 43 U/L (ref 39–117)
ALT: 20 U/L (ref 0–53)
AST: 20 U/L (ref 0–37)
Albumin: 3.8 g/dL (ref 3.5–5.2)
BILIRUBIN TOTAL: 0.4 mg/dL (ref 0.2–1.2)
BUN: 17 mg/dL (ref 6–23)
CO2: 32 mEq/L (ref 19–32)
CREATININE: 1.06 mg/dL (ref 0.40–1.50)
Calcium: 9.6 mg/dL (ref 8.4–10.5)
Chloride: 100 mEq/L (ref 96–112)
GFR: 92.07 mL/min (ref >60.00)
GLUCOSE: 95 mg/dL (ref 70–99)
Potassium: 3.7 mEq/L (ref 3.5–5.1)
Sodium: 137 mEq/L (ref 135–145)
TOTAL PROTEIN: 6.8 g/dL (ref 6.0–8.3)

## 2018-01-22 NOTE — Progress Notes (Signed)
Pre visit review using our clinic review tool, if applicable. No additional management support is needed unless otherwise documented below in the visit note.  Patient here today for BP check and CMP. At last OV on 12/26/2017, Dr. Charlett Blake added Lisinopril 5mg  daily (however Lisinopril 10mg  on med list) and asked patient to return to clinic in 3-4 weeks for BP check.   BP today: 131/84 in L arm sitting Pulse: 67   Per Percell Miller continue same medications- follow-up with Dr. Charlett Blake as already scheduled.   This is the above I gave to [place] after we reviewed patient's recent blood pressure readings and plan.  Basically he will continue the 10 mg dose of lisinopril.  This is when he has been taking recently his blood pressures are very well controlled.  Mackie Pai, PA-C

## 2018-01-22 NOTE — Patient Instructions (Addendum)
Please go to the lab before you leave.   Continue same medications.   Follow-up with Dr. Charlett Blake as scheduled on 05/12/2018.

## 2018-05-10 ENCOUNTER — Other Ambulatory Visit: Payer: Self-pay | Admitting: Family Medicine

## 2018-05-12 ENCOUNTER — Ambulatory Visit (INDEPENDENT_AMBULATORY_CARE_PROVIDER_SITE_OTHER): Payer: Managed Care, Other (non HMO) | Admitting: Family Medicine

## 2018-05-12 ENCOUNTER — Encounter: Payer: Self-pay | Admitting: Family Medicine

## 2018-05-12 VITALS — BP 110/62 | HR 65 | Temp 98.0°F | Resp 18 | Ht 66.0 in | Wt 163.6 lb

## 2018-05-12 DIAGNOSIS — Z0000 Encounter for general adult medical examination without abnormal findings: Secondary | ICD-10-CM

## 2018-05-12 DIAGNOSIS — K219 Gastro-esophageal reflux disease without esophagitis: Secondary | ICD-10-CM | POA: Insufficient documentation

## 2018-05-12 DIAGNOSIS — R739 Hyperglycemia, unspecified: Secondary | ICD-10-CM

## 2018-05-12 DIAGNOSIS — E782 Mixed hyperlipidemia: Secondary | ICD-10-CM

## 2018-05-12 DIAGNOSIS — I10 Essential (primary) hypertension: Secondary | ICD-10-CM

## 2018-05-12 LAB — COMPREHENSIVE METABOLIC PANEL
ALT: 21 U/L (ref 0–53)
AST: 19 U/L (ref 0–37)
Albumin: 4.1 g/dL (ref 3.5–5.2)
Alkaline Phosphatase: 45 U/L (ref 39–117)
BILIRUBIN TOTAL: 0.4 mg/dL (ref 0.2–1.2)
BUN: 14 mg/dL (ref 6–23)
CO2: 32 mEq/L (ref 19–32)
Calcium: 9.7 mg/dL (ref 8.4–10.5)
Chloride: 99 mEq/L (ref 96–112)
Creatinine, Ser: 1.1 mg/dL (ref 0.40–1.50)
GFR: 88.12 mL/min (ref >60.00)
GLUCOSE: 99 mg/dL (ref 70–99)
POTASSIUM: 3.6 meq/L (ref 3.5–5.1)
SODIUM: 139 meq/L (ref 135–145)
Total Protein: 6.7 g/dL (ref 6.0–8.3)

## 2018-05-12 LAB — CBC
HCT: 44 % (ref 39.0–52.0)
HEMOGLOBIN: 15 g/dL (ref 13.0–17.0)
MCHC: 34 g/dL (ref 30.0–36.0)
MCV: 88 fl (ref 78.0–100.0)
Platelets: 190 10*3/uL (ref 150.0–400.0)
RBC: 5 Mil/uL (ref 4.22–5.81)
RDW: 14.7 % (ref 11.5–15.5)
WBC: 4.4 10*3/uL (ref 4.0–10.5)

## 2018-05-12 LAB — LIPID PANEL
Cholesterol: 214 mg/dL — ABNORMAL HIGH (ref 0–200)
HDL: 57 mg/dL (ref >39.00)
LDL Cholesterol: 146 mg/dL — ABNORMAL HIGH (ref 0–99)
NONHDL: 157.46
Total CHOL/HDL Ratio: 4
Triglycerides: 56 mg/dL (ref 0.0–149.0)
VLDL: 11.2 mg/dL (ref 0.0–40.0)

## 2018-05-12 LAB — TSH: TSH: 1.23 u[IU]/mL (ref 0.35–4.50)

## 2018-05-12 LAB — HEMOGLOBIN A1C: HEMOGLOBIN A1C: 5.9 % (ref 4.6–6.5)

## 2018-05-12 NOTE — Assessment & Plan Note (Signed)
Well controlled, no changes to meds. Encouraged heart healthy diet such as the DASH diet and exercise as tolerated.  °

## 2018-05-12 NOTE — Assessment & Plan Note (Signed)
Patient encouraged to maintain heart healthy diet, regular exercise, adequate sleep. Consider daily probiotics. Take medications as prescribed 

## 2018-05-12 NOTE — Assessment & Plan Note (Signed)
Encouraged heart healthy diet, increase exercise, avoid trans fats, consider a krill oil cap daily 

## 2018-05-12 NOTE — Assessment & Plan Note (Signed)
Avoid offending foods, start probiotics. Do not eat large meals in late evening and consider raising head of bed.  

## 2018-05-12 NOTE — Assessment & Plan Note (Signed)
hgba1c acceptable, minimize simple carbs. Increase exercise as tolerated.  

## 2018-05-12 NOTE — Patient Instructions (Signed)
Ranitidine/Zantac or Famotidine/Pepcid for heartburn on and off with Tums     Preventive Care 40-64 Years, Male Preventive care refers to lifestyle choices and visits with your health care provider that can promote health and wellness. What does preventive care include?  A yearly physical exam. This is also called an annual well check.  Dental exams once or twice a year.  Routine eye exams. Ask your health care provider how often you should have your eyes checked.  Personal lifestyle choices, including: ? Daily care of your teeth and gums. ? Regular physical activity. ? Eating a healthy diet. ? Avoiding tobacco and drug use. ? Limiting alcohol use. ? Practicing safe sex. ? Taking low-dose aspirin every day starting at age 21. What happens during an annual well check? The services and screenings done by your health care provider during your annual well check will depend on your age, overall health, lifestyle risk factors, and family history of disease. Counseling Your health care provider may ask you questions about your:  Alcohol use.  Tobacco use.  Drug use.  Emotional well-being.  Home and relationship well-being.  Sexual activity.  Eating habits.  Work and work Statistician.  Screening You may have the following tests or measurements:  Height, weight, and BMI.  Blood pressure.  Lipid and cholesterol levels. These may be checked every 5 years, or more frequently if you are over 54 years old.  Skin check.  Lung cancer screening. You may have this screening every year starting at age 37 if you have a 30-pack-year history of smoking and currently smoke or have quit within the past 15 years.  Fecal occult blood test (FOBT) of the stool. You may have this test every year starting at age 45.  Flexible sigmoidoscopy or colonoscopy. You may have a sigmoidoscopy every 5 years or a colonoscopy every 10 years starting at age 79.  Prostate cancer screening.  Recommendations will vary depending on your family history and other risks.  Hepatitis C blood test.  Hepatitis B blood test.  Sexually transmitted disease (STD) testing.  Diabetes screening. This is done by checking your blood sugar (glucose) after you have not eaten for a while (fasting). You may have this done every 1-3 years.  Discuss your test results, treatment options, and if necessary, the need for more tests with your health care provider. Vaccines Your health care provider may recommend certain vaccines, such as:  Influenza vaccine. This is recommended every year.  Tetanus, diphtheria, and acellular pertussis (Tdap, Td) vaccine. You may need a Td booster every 10 years.  Varicella vaccine. You may need this if you have not been vaccinated.  Zoster vaccine. You may need this after age 20.  Measles, mumps, and rubella (MMR) vaccine. You may need at least one dose of MMR if you were born in 1957 or later. You may also need a second dose.  Pneumococcal 13-valent conjugate (PCV13) vaccine. You may need this if you have certain conditions and have not been vaccinated.  Pneumococcal polysaccharide (PPSV23) vaccine. You may need one or two doses if you smoke cigarettes or if you have certain conditions.  Meningococcal vaccine. You may need this if you have certain conditions.  Hepatitis A vaccine. You may need this if you have certain conditions or if you travel or work in places where you may be exposed to hepatitis A.  Hepatitis B vaccine. You may need this if you have certain conditions or if you travel or work in places where  you may be exposed to hepatitis B.  Haemophilus influenzae type b (Hib) vaccine. You may need this if you have certain risk factors.  Talk to your health care provider about which screenings and vaccines you need and how often you need them. This information is not intended to replace advice given to you by your health care provider. Make sure you  discuss any questions you have with your health care provider. Document Released: 12/09/2015 Document Revised: 08/01/2016 Document Reviewed: 09/13/2015 Elsevier Interactive Patient Education  [employer].

## 2018-05-12 NOTE — Progress Notes (Signed)
Subjective:  I acted as a Education administrator for Dr. Charlett Blake. [place], [place]  [name], male    DOB: 1960/12/29, 57 y.o.   MRN: [id number]  No chief complaint on file.   HPI  Patient is in today for an annual exam and follow up on chronic medical concerns including hypertension, hyperglycemia and hyperlipidemia. He reports he is doing well. No recent febrile illness or hospitalizations. No polyuria or polydipsia. Denies CP/palp/SOB/HA/congestion/fevers/GI or GU c/o. Taking meds as prescribed. Doing well with activities of daily living. Here today accompanied by his wife.   Patient Care Team: Mosie Lukes, MD as PCP - General (Family Medicine) Alexis Frock, MD as Consulting Physician (Urology)   Past Medical History:  Diagnosis Date  . Allergy   . Constipation 12/25/2015  . Dysrhythmia    sinus arrythmia-2006-work up negative findings  . Hyperglycemia 03/15/2015  . Hyperlipemia   . Hypertension   . Leg pain, diffuse, right 04/15/2017  . Preventative health care 03/20/2015   Sees Dr Collene Mares of gastroenterology    Past Surgical History:  Procedure Laterality Date  . KNEE ARTHROSCOPY  2009   rt knee  . KNEE ARTHROSCOPY  01/16/2012   Procedure: ARTHROSCOPY KNEE;  Surgeon: Gearlean Alf, MD;  Location: [facility];  Service: Orthopedics;  Laterality: Left;  WITH DEBRIDEMENT  . VENTRAL HERNIA REPAIR      Family History  Problem Relation Age of Onset  . Hypertension Mother   . Diabetes Mother        labile  . Kidney disease Mother   . Peripheral vascular disease Mother   . Diabetes Maternal Aunt   . Heart disease Maternal Aunt   . Heart disease Father        MI?  . Drug abuse Son   . Heart disease Cousin   . Diabetes Cousin   . Alcohol abuse Cousin     Social History   Socioeconomic History  . Marital status: Married    Spouse name: Delora  . Number of children: Not on file  . Years of education: 34  . Highest education level: Not on file    Occupational History  . Occupation: Administrator for [employer]  . Financial resource strain: Not on file  . Food insecurity:    Worry: Not on file    Inability: Not on file  . Transportation needs:    Medical: Not on file    Non-medical: Not on file  Tobacco Use  . Smoking status: Never Smoker  . Smokeless tobacco: Never Used  Substance and Sexual Activity  . Alcohol use: No  . Drug use: No  . Sexual activity: Yes    Comment: lives with wife, no major dietary restrictions, works for [employer]  . Physical activity:    Days per week: Not on file    Minutes per session: Not on file  . Stress: Not on file  Relationships  . Social connections:    Talks on phone: Not on file    Gets together: Not on file    Attends religious service: Not on file    Active member of club or organization: Not on file    Attends meetings of clubs or organizations: Not on file    Relationship status: Not on file  . Intimate partner violence:    Fear of current or ex partner: Not on file    Emotionally abused: Not on file  Physically abused: Not on file    Forced sexual activity: Not on file  Other Topics Concern  . Not on file  Social History Narrative  . Not on file    Outpatient Medications Prior to Visit  Medication Sig Dispense Refill  . aspirin 81 MG tablet Take 81 mg by mouth daily.    Marland Kitchen lisinopril (PRINIVIL,ZESTRIL) 10 MG tablet Take 1 tablet (10 mg total) by mouth daily. 30 tablet 3  . fish oil-omega-3 fatty acids 1000 MG capsule Take 2 g by mouth daily.    Marland Kitchen gabapentin (NEURONTIN) 100 MG capsule Take 1 capsule (100 mg total) by mouth 3 (three) times daily. 90 capsule 3  . Garlic 10 MG CAPS Take by mouth.    . hydrochlorothiazide (HYDRODIURIL) 25 MG tablet TAKE ONE TABLET BY MOUTH ONCE DAILY 30 tablet 8  . HYDROcodone-acetaminophen (NORCO) 5-325 MG tablet Take 1 tablet by mouth every 6 (six) hours as needed for moderate pain. 20 tablet 0  . meloxicam (MOBIC) 7.5  MG tablet Take 1 tablet (7.5 mg total) by mouth 2 (two) times daily as needed for pain. With food 40 tablet 1  . Multiple Vitamin (MULTIVITAMIN) tablet Take 1 tablet by mouth daily.    . Red Yeast Rice 600 MG CAPS Take 1 tablet by mouth daily.    . traMADol (ULTRAM) 50 MG tablet Take 1 tablet (50 mg total) by mouth every 6 (six) hours as needed for moderate pain. 16 tablet 0  . vitamin E 600 UNIT capsule Take 600 Units by mouth daily.    . cyclobenzaprine (FLEXERIL) 10 MG tablet Take 1 tablet (10 mg total) by mouth 2 (two) times daily as needed for muscle spasms. 20 tablet 0  . famciclovir (FAMVIR) 500 MG tablet Take 1 tablet (500 mg total) by mouth 3 (three) times daily. 21 tablet 0   No facility-administered medications prior to visit.     No Known Allergies  Review of Systems  Constitutional: Negative for chills, fever and malaise/fatigue.  HENT: Negative for congestion and hearing loss.   Eyes: Negative for discharge.  Respiratory: Negative for cough, sputum production and shortness of breath.   Cardiovascular: Negative for chest pain, palpitations and leg swelling.  Gastrointestinal: Negative for abdominal pain, blood in stool, constipation, diarrhea, heartburn, nausea and vomiting.  Genitourinary: Negative for dysuria, frequency, hematuria and urgency.  Musculoskeletal: Negative for back pain, falls and myalgias.  Skin: Negative for rash.  Neurological: Negative for dizziness, sensory change, loss of consciousness, weakness and headaches.  Endo/Heme/Allergies: Negative for environmental allergies. Does not bruise/bleed easily.  Psychiatric/Behavioral: Negative for depression and suicidal ideas. The patient is not nervous/anxious and does not have insomnia.        Objective:    Physical Exam  Constitutional: He is oriented to person, place, and time. He appears well-developed and well-nourished. No distress.  HENT:  Head: Normocephalic and atraumatic.  Eyes: Conjunctivae are  normal.  Neck: Neck supple. No thyromegaly present.  Cardiovascular: Normal rate, regular rhythm and normal heart sounds.  No murmur heard. Pulmonary/Chest: Effort normal and breath sounds normal. No respiratory distress. He has no wheezes.  Abdominal: Soft. Bowel sounds are normal. He exhibits no mass. There is no tenderness.  Musculoskeletal: He exhibits no edema.  Lymphadenopathy:    He has no cervical adenopathy.  Neurological: He is alert and oriented to person, place, and time.  Skin: Skin is warm and dry.  Psychiatric: He has a normal mood and affect. His behavior  is normal.    BP 110/62 (BP Location: Left Arm, Patient Position: Sitting, Cuff Size: Normal)   Pulse 65   Temp 98 F (36.7 C) (Oral)   Resp 18   Ht 5\' 6"  (1.676 m)   Wt 163 lb 9.6 oz (74.2 kg)   SpO2 97%   BMI 26.41 kg/m  Wt Readings from Last 3 Encounters:  05/12/18 163 lb 9.6 oz (74.2 kg)  12/26/17 168 lb 9.6 oz (76.5 kg)  10/14/17 162 lb 12.8 oz (73.8 kg)   BP Readings from Last 3 Encounters:  05/12/18 110/62  01/22/18 131/84  12/26/17 (!) 153/92     Immunization History  Administered Date(s) Administered  . Tdap 04/10/2016  . Zoster Recombinat (Shingrix) 10/14/2017    Health Maintenance  Topic Date Due  . INFLUENZA VACCINE  06/26/2018  . COLONOSCOPY  11/26/2021  . TETANUS/TDAP  04/10/2026  . Hepatitis C Screening  Completed  . HIV Screening  Completed    Lab Results  Component Value Date   WBC 4.4 05/12/2018   HGB 15.0 05/12/2018   HCT 44.0 05/12/2018   PLT 190.0 05/12/2018   GLUCOSE 99 05/12/2018   CHOL 214 (H) 05/12/2018   TRIG 56.0 05/12/2018   HDL 57.00 05/12/2018   LDLCALC 146 (H) 05/12/2018   ALT 21 05/12/2018   AST 19 05/12/2018   NA 139 05/12/2018   K 3.6 05/12/2018   CL 99 05/12/2018   CREATININE 1.10 05/12/2018   BUN 14 05/12/2018   CO2 32 05/12/2018   TSH 1.23 05/12/2018   PSA 3.61 04/15/2017   HGBA1C 5.9 05/12/2018    Lab Results  Component Value Date   TSH  1.23 05/12/2018   Lab Results  Component Value Date   WBC 4.4 05/12/2018   HGB 15.0 05/12/2018   HCT 44.0 05/12/2018   MCV 88.0 05/12/2018   PLT 190.0 05/12/2018   Lab Results  Component Value Date   NA 139 05/12/2018   K 3.6 05/12/2018   CO2 32 05/12/2018   GLUCOSE 99 05/12/2018   BUN 14 05/12/2018   CREATININE 1.10 05/12/2018   BILITOT 0.4 05/12/2018   ALKPHOS 45 05/12/2018   AST 19 05/12/2018   ALT 21 05/12/2018   PROT 6.7 05/12/2018   ALBUMIN 4.1 05/12/2018   CALCIUM 9.7 05/12/2018   ANIONGAP 8 08/17/2017   GFR 88.12 05/12/2018   Lab Results  Component Value Date   CHOL 214 (H) 05/12/2018   Lab Results  Component Value Date   HDL 57.00 05/12/2018   Lab Results  Component Value Date   LDLCALC 146 (H) 05/12/2018   Lab Results  Component Value Date   TRIG 56.0 05/12/2018   Lab Results  Component Value Date   CHOLHDL 4 05/12/2018   Lab Results  Component Value Date   HGBA1C 5.9 05/12/2018         Assessment & Plan:   Problem List Items Addressed This Visit    Hyperlipemia    Encouraged heart healthy diet, increase exercise, avoid trans fats, consider a krill oil cap daily      Hypertension    Well controlled, no changes to meds. Encouraged heart healthy diet such as the DASH diet and exercise as tolerated.       Relevant Orders   CBC (Completed)   Comprehensive metabolic panel (Completed)   TSH (Completed)   Hyperglycemia    hgba1c acceptable, minimize simple carbs. Increase exercise as tolerated.       Relevant  Orders   Hemoglobin A1c (Completed)   Preventative health care    Patient encouraged to maintain heart healthy diet, regular exercise, adequate sleep. Consider daily probiotics. Take medications as prescribed      Relevant Orders   Lipid panel (Completed)   Acid reflux    Avoid offending foods, start probiotics. Do not eat large meals in late evening and consider raising head of bed.          I have discontinued  Anson Woznick's cyclobenzaprine and famciclovir. I am also having him maintain his Garlic, multivitamin, aspirin, fish oil-omega-3 fatty acids, Red Yeast Rice, vitamin E, meloxicam, traMADol, gabapentin, HYDROcodone-acetaminophen, and hydrochlorothiazide.  No orders of the defined types were placed in this encounter.   CMA served as Education administrator during this visit. History, Physical and Plan performed by medical provider. Documentation and orders reviewed and attested to.  Penni Homans, MD

## 2018-06-03 ENCOUNTER — Encounter: Payer: Self-pay | Admitting: Family Medicine

## 2018-06-20 ENCOUNTER — Other Ambulatory Visit: Payer: Self-pay | Admitting: Family Medicine

## 2018-09-18 ENCOUNTER — Other Ambulatory Visit: Payer: Self-pay | Admitting: Family Medicine

## 2018-11-10 ENCOUNTER — Ambulatory Visit (INDEPENDENT_AMBULATORY_CARE_PROVIDER_SITE_OTHER): Payer: Managed Care, Other (non HMO) | Admitting: Family Medicine

## 2018-11-10 DIAGNOSIS — E782 Mixed hyperlipidemia: Secondary | ICD-10-CM

## 2018-11-10 DIAGNOSIS — R739 Hyperglycemia, unspecified: Secondary | ICD-10-CM | POA: Diagnosis not present

## 2018-11-10 DIAGNOSIS — I10 Essential (primary) hypertension: Secondary | ICD-10-CM | POA: Diagnosis not present

## 2018-11-10 LAB — COMPREHENSIVE METABOLIC PANEL
ALK PHOS: 42 U/L (ref 39–117)
ALT: 18 U/L (ref 0–53)
AST: 17 U/L (ref 0–37)
Albumin: 4 g/dL (ref 3.5–5.2)
BILIRUBIN TOTAL: 0.6 mg/dL (ref 0.2–1.2)
BUN: 16 mg/dL (ref 6–23)
CO2: 32 mEq/L (ref 19–32)
Calcium: 9.2 mg/dL (ref 8.4–10.5)
Chloride: 99 mEq/L (ref 96–112)
Creatinine, Ser: 1.09 mg/dL (ref 0.40–1.50)
GFR: 88.91 mL/min (ref >60.00)
Glucose, Bld: 99 mg/dL (ref 70–99)
Potassium: 3.9 mEq/L (ref 3.5–5.1)
Sodium: 138 mEq/L (ref 135–145)
Total Protein: 6.5 g/dL (ref 6.0–8.3)

## 2018-11-10 LAB — LIPID PANEL
Cholesterol: 192 mg/dL (ref 0–200)
HDL: 57.8 mg/dL (ref >39.00)
LDL Cholesterol: 122 mg/dL — ABNORMAL HIGH (ref 0–99)
NonHDL: 134.53
Total CHOL/HDL Ratio: 3
Triglycerides: 62 mg/dL (ref 0.0–149.0)
VLDL: 12.4 mg/dL (ref 0.0–40.0)

## 2018-11-10 LAB — HEMOGLOBIN A1C: Hgb A1c MFr Bld: 5.9 % (ref 4.6–6.5)

## 2018-11-10 NOTE — Assessment & Plan Note (Signed)
Encouraged heart healthy diet, increase exercise, avoid trans fats, consider a krill oil cap daily 

## 2018-11-10 NOTE — Patient Instructions (Signed)

## 2018-11-10 NOTE — Progress Notes (Signed)
Subjective:    [name], male    DOB: 09/02/1961, 57 y.o.   MRN: [id number]  No chief complaint on file.   HPI Patient is in today for follow up. He feels well. No recent febrile illness or hospitalizations. No significant polyuria, polydipsia. Is trying to minimize carbs and maintain a heart healthy. Denies CP/palp/SOB/HA/congestion/fevers/GI or GU c/o. Taking meds as prescribed  Past Medical History:  Diagnosis Date  . Allergy   . Constipation 12/25/2015  . Dysrhythmia    sinus arrythmia-2006-work up negative findings  . Hyperglycemia 03/15/2015  . Hyperlipemia   . Hypertension   . Leg pain, diffuse, right 04/15/2017  . Preventative health care 03/20/2015   Sees Dr Collene Mares of gastroenterology    Past Surgical History:  Procedure Laterality Date  . KNEE ARTHROSCOPY  2009   rt knee  . KNEE ARTHROSCOPY  01/16/2012   Procedure: ARTHROSCOPY KNEE;  Surgeon: Gearlean Alf, MD;  Location: [facility];  Service: Orthopedics;  Laterality: Left;  WITH DEBRIDEMENT  . VENTRAL HERNIA REPAIR      Family History  Problem Relation Age of Onset  . Hypertension Mother   . Diabetes Mother        labile  . Kidney disease Mother   . Peripheral vascular disease Mother   . Diabetes Maternal Aunt   . Heart disease Maternal Aunt   . Heart disease Father        MI?  . Drug abuse Son   . Heart disease Cousin   . Diabetes Cousin   . Alcohol abuse Cousin     Social History   Socioeconomic History  . Marital status: Married    Spouse name: Delora  . Number of children: Not on file  . Years of education: 73  . Highest education level: Not on file  Occupational History  . Occupation: Administrator for [employer]  . Financial resource strain: Not on file  . Food insecurity:    Worry: Not on file    Inability: Not on file  . Transportation needs:    Medical: Not on file    Non-medical: Not on file  Tobacco Use  . Smoking status: Never Smoker   . Smokeless tobacco: Never Used  Substance and Sexual Activity  . Alcohol use: No  . Drug use: No  . Sexual activity: Yes    Comment: lives with wife, no major dietary restrictions, works for [employer]  . Physical activity:    Days per week: Not on file    Minutes per session: Not on file  . Stress: Not on file  Relationships  . Social connections:    Talks on phone: Not on file    Gets together: Not on file    Attends religious service: Not on file    Active member of club or organization: Not on file    Attends meetings of clubs or organizations: Not on file    Relationship status: Not on file  . Intimate partner violence:    Fear of current or ex partner: Not on file    Emotionally abused: Not on file    Physically abused: Not on file    Forced sexual activity: Not on file  Other Topics Concern  . Not on file  Social History Narrative  . Not on file    Outpatient Medications Prior to Visit  Medication Sig Dispense Refill  . aspirin 81 MG tablet Take  81 mg by mouth daily.    . fish oil-omega-3 fatty acids 1000 MG capsule Take 2 g by mouth daily.    Marland Kitchen gabapentin (NEURONTIN) 100 MG capsule Take 1 capsule (100 mg total) by mouth 3 (three) times daily. 90 capsule 3  . Garlic 10 MG CAPS Take by mouth.    . hydrochlorothiazide (HYDRODIURIL) 25 MG tablet TAKE 1 TABLET BY MOUTH ONCE DAILY 30 tablet 8  . HYDROcodone-acetaminophen (NORCO) 5-325 MG tablet Take 1 tablet by mouth every 6 (six) hours as needed for moderate pain. 20 tablet 0  . lisinopril (PRINIVIL,ZESTRIL) 10 MG tablet TAKE 1 TABLET BY MOUTH ONCE DAILY 30 tablet 3  . meloxicam (MOBIC) 7.5 MG tablet Take 1 tablet (7.5 mg total) by mouth 2 (two) times daily as needed for pain. With food 40 tablet 1  . Multiple Vitamin (MULTIVITAMIN) tablet Take 1 tablet by mouth daily.    . Red Yeast Rice 600 MG CAPS Take 1 tablet by mouth daily.    . traMADol (ULTRAM) 50 MG tablet Take 1 tablet (50 mg total) by mouth every 6  (six) hours as needed for moderate pain. 16 tablet 0  . vitamin E 600 UNIT capsule Take 600 Units by mouth daily.     No facility-administered medications prior to visit.     No Known Allergies  Review of Systems  Constitutional: Negative for fever and malaise/fatigue.  HENT: Negative for congestion.   Eyes: Negative for blurred vision.  Respiratory: Negative for shortness of breath.   Cardiovascular: Negative for chest pain, palpitations and leg swelling.  Gastrointestinal: Negative for abdominal pain, blood in stool and nausea.  Genitourinary: Negative for dysuria and frequency.  Musculoskeletal: Negative for falls.  Skin: Negative for rash.  Neurological: Negative for dizziness, loss of consciousness and headaches.  Endo/Heme/Allergies: Negative for environmental allergies.  Psychiatric/Behavioral: Negative for depression. The patient is not nervous/anxious.        Objective:    Physical Exam Vitals signs and nursing note reviewed.  Constitutional:      General: He is not in acute distress.    Appearance: He is well-developed.  HENT:     Head: Normocephalic and atraumatic.     Nose: Nose normal.  Eyes:     General:        Right eye: No discharge.        Left eye: No discharge.  Neck:     Musculoskeletal: Normal range of motion and neck supple.  Cardiovascular:     Rate and Rhythm: Normal rate and regular rhythm.     Heart sounds: No murmur.  Pulmonary:     Effort: Pulmonary effort is normal.     Breath sounds: Normal breath sounds.  Abdominal:     General: Bowel sounds are normal.     Palpations: Abdomen is soft.     Tenderness: There is no abdominal tenderness.  Skin:    General: Skin is warm and dry.  Neurological:     Mental Status: He is alert and oriented to person, place, and time.     BP 110/78 (BP Location: Left Arm, Patient Position: Sitting, Cuff Size: Normal)   Pulse 93   Temp 97.9 F (36.6 C) (Oral)   Resp 18   Ht 5\' 6"  (1.676 m)   Wt 163  lb 6.4 oz (74.1 kg)   SpO2 97%   BMI 26.37 kg/m  Wt Readings from Last 3 Encounters:  11/10/18 163 lb 6.4 oz (74.1 kg)  05/12/18 163 lb 9.6 oz (74.2 kg)  12/26/17 168 lb 9.6 oz (76.5 kg)     Lab Results  Component Value Date   WBC 4.4 05/12/2018   HGB 15.0 05/12/2018   HCT 44.0 05/12/2018   PLT 190.0 05/12/2018   GLUCOSE 99 05/12/2018   CHOL 214 (H) 05/12/2018   TRIG 56.0 05/12/2018   HDL 57.00 05/12/2018   LDLCALC 146 (H) 05/12/2018   ALT 21 05/12/2018   AST 19 05/12/2018   NA 139 05/12/2018   K 3.6 05/12/2018   CL 99 05/12/2018   CREATININE 1.10 05/12/2018   BUN 14 05/12/2018   CO2 32 05/12/2018   TSH 1.23 05/12/2018   PSA 3.61 04/15/2017   HGBA1C 5.9 05/12/2018    Lab Results  Component Value Date   TSH 1.23 05/12/2018   Lab Results  Component Value Date   WBC 4.4 05/12/2018   HGB 15.0 05/12/2018   HCT 44.0 05/12/2018   MCV 88.0 05/12/2018   PLT 190.0 05/12/2018   Lab Results  Component Value Date   NA 139 05/12/2018   K 3.6 05/12/2018   CO2 32 05/12/2018   GLUCOSE 99 05/12/2018   BUN 14 05/12/2018   CREATININE 1.10 05/12/2018   BILITOT 0.4 05/12/2018   ALKPHOS 45 05/12/2018   AST 19 05/12/2018   ALT 21 05/12/2018   PROT 6.7 05/12/2018   ALBUMIN 4.1 05/12/2018   CALCIUM 9.7 05/12/2018   ANIONGAP 8 08/17/2017   GFR 88.12 05/12/2018   Lab Results  Component Value Date   CHOL 214 (H) 05/12/2018   Lab Results  Component Value Date   HDL 57.00 05/12/2018   Lab Results  Component Value Date   LDLCALC 146 (H) 05/12/2018   Lab Results  Component Value Date   TRIG 56.0 05/12/2018   Lab Results  Component Value Date   CHOLHDL 4 05/12/2018   Lab Results  Component Value Date   HGBA1C 5.9 05/12/2018       Assessment & Plan:   Problem List Items Addressed This Visit    Hyperlipemia    Encouraged heart healthy diet, increase exercise, avoid trans fats, consider a krill oil cap daily      Relevant Orders   Lipid panel    Hypertension    Well controlled, no changes to meds. Encouraged heart healthy diet such as the DASH diet and exercise as tolerated.       Relevant Orders   Comprehensive metabolic panel   Hyperglycemia    hgba1c acceptable, minimize simple carbs. Increase exercise as tolerated.      Relevant Orders   Hemoglobin A1c      I am having Fawn Kirk maintain his Garlic, multivitamin, aspirin, fish oil-omega-3 fatty acids, Red Yeast Rice, vitamin E, meloxicam, traMADol, gabapentin, HYDROcodone-acetaminophen, hydrochlorothiazide, and lisinopril.  No orders of the defined types were placed in this encounter.    Penni Homans, MD

## 2018-11-10 NOTE — Assessment & Plan Note (Signed)
hgba1c acceptable, minimize simple carbs. Increase exercise as tolerated.  

## 2018-11-10 NOTE — Assessment & Plan Note (Signed)
Well controlled, no changes to meds. Encouraged heart healthy diet such as the DASH diet and exercise as tolerated.  °

## 2019-02-03 ENCOUNTER — Other Ambulatory Visit: Payer: Self-pay | Admitting: Family Medicine

## 2019-03-04 ENCOUNTER — Other Ambulatory Visit: Payer: Self-pay | Admitting: Family Medicine

## 2019-03-21 ENCOUNTER — Other Ambulatory Visit: Payer: Self-pay | Admitting: Family Medicine

## 2019-04-13 ENCOUNTER — Other Ambulatory Visit: Payer: Self-pay | Admitting: Family Medicine

## 2019-04-23 ENCOUNTER — Other Ambulatory Visit: Payer: Self-pay | Admitting: Family Medicine

## 2019-05-16 ENCOUNTER — Other Ambulatory Visit: Payer: Self-pay | Admitting: Family Medicine

## 2019-05-18 ENCOUNTER — Encounter: Payer: Managed Care, Other (non HMO) | Admitting: Family Medicine

## 2019-05-23 ENCOUNTER — Other Ambulatory Visit: Payer: Self-pay | Admitting: Family Medicine

## 2019-06-12 ENCOUNTER — Other Ambulatory Visit: Payer: Self-pay | Admitting: Family Medicine

## 2019-06-19 ENCOUNTER — Other Ambulatory Visit: Payer: Self-pay | Admitting: Family Medicine

## 2019-07-14 ENCOUNTER — Other Ambulatory Visit: Payer: Self-pay | Admitting: Family Medicine

## 2019-07-23 ENCOUNTER — Other Ambulatory Visit: Payer: Self-pay | Admitting: Family Medicine

## 2019-08-17 ENCOUNTER — Other Ambulatory Visit: Payer: Self-pay

## 2019-08-17 ENCOUNTER — Ambulatory Visit (INDEPENDENT_AMBULATORY_CARE_PROVIDER_SITE_OTHER): Payer: Managed Care, Other (non HMO) | Admitting: Family Medicine

## 2019-08-17 VITALS — BP 124/82 | HR 68 | Temp 96.4°F | Resp 18 | Ht 66.0 in | Wt 165.6 lb

## 2019-08-17 DIAGNOSIS — Z0000 Encounter for general adult medical examination without abnormal findings: Secondary | ICD-10-CM | POA: Diagnosis not present

## 2019-08-17 DIAGNOSIS — R350 Frequency of micturition: Secondary | ICD-10-CM

## 2019-08-17 DIAGNOSIS — E782 Mixed hyperlipidemia: Secondary | ICD-10-CM | POA: Diagnosis not present

## 2019-08-17 DIAGNOSIS — R739 Hyperglycemia, unspecified: Secondary | ICD-10-CM

## 2019-08-17 DIAGNOSIS — I10 Essential (primary) hypertension: Secondary | ICD-10-CM

## 2019-08-17 DIAGNOSIS — K219 Gastro-esophageal reflux disease without esophagitis: Secondary | ICD-10-CM | POA: Diagnosis not present

## 2019-08-17 DIAGNOSIS — Z1211 Encounter for screening for malignant neoplasm of colon: Secondary | ICD-10-CM

## 2019-08-17 DIAGNOSIS — R351 Nocturia: Secondary | ICD-10-CM

## 2019-08-17 LAB — CBC
HCT: 44.9 % (ref 39.0–52.0)
Hemoglobin: 15 g/dL (ref 13.0–17.0)
MCHC: 33.4 g/dL (ref 30.0–36.0)
MCV: 89.1 fl (ref 78.0–100.0)
Platelets: 204 10*3/uL (ref 150.0–400.0)
RBC: 5.04 Mil/uL (ref 4.22–5.81)
RDW: 14.2 % (ref 11.5–15.5)
WBC: 4.6 10*3/uL (ref 4.0–10.5)

## 2019-08-17 LAB — COMPREHENSIVE METABOLIC PANEL
ALT: 22 U/L (ref 0–53)
AST: 18 U/L (ref 0–37)
Albumin: 3.9 g/dL (ref 3.5–5.2)
Alkaline Phosphatase: 57 U/L (ref 39–117)
BUN: 17 mg/dL (ref 6–23)
CO2: 32 mEq/L (ref 19–32)
Calcium: 9.5 mg/dL (ref 8.4–10.5)
Chloride: 100 mEq/L (ref 96–112)
Creatinine, Ser: 1.06 mg/dL (ref 0.40–1.50)
GFR: 86.16 mL/min (ref >60.00)
Glucose, Bld: 96 mg/dL (ref 70–99)
Potassium: 4.1 mEq/L (ref 3.5–5.1)
Sodium: 138 mEq/L (ref 135–145)
Total Bilirubin: 0.4 mg/dL (ref 0.2–1.2)
Total Protein: 6.5 g/dL (ref 6.0–8.3)

## 2019-08-17 LAB — LIPID PANEL
Cholesterol: 186 mg/dL (ref 0–200)
HDL: 49.3 mg/dL (ref >39.00)
LDL Cholesterol: 124 mg/dL — ABNORMAL HIGH (ref 0–99)
NonHDL: 136.98
Total CHOL/HDL Ratio: 4
Triglycerides: 63 mg/dL (ref 0.0–149.0)
VLDL: 12.6 mg/dL (ref 0.0–40.0)

## 2019-08-17 LAB — TSH: TSH: 1.26 u[IU]/mL (ref 0.35–4.50)

## 2019-08-17 LAB — PSA: PSA: 4.17 ng/mL — ABNORMAL HIGH (ref 0.10–4.00)

## 2019-08-17 MED ORDER — FAMOTIDINE 40 MG PO TABS: 40.0000 mg | ORAL_TABLET | Freq: Every evening | ORAL | 5 refills | Status: DC | PRN

## 2019-08-17 MED ORDER — HYDROCHLOROTHIAZIDE 25 MG PO TABS: 25.0000 mg | ORAL_TABLET | Freq: Every day | ORAL | 1 refills | Status: DC

## 2019-08-17 MED ORDER — LISINOPRIL 10 MG PO TABS: 10.0000 mg | ORAL_TABLET | Freq: Every day | ORAL | 1 refills | Status: DC

## 2019-08-17 NOTE — Assessment & Plan Note (Signed)
Flared recently after eating. Avoid offending foods, start probiotics. Do not eat large meals in late evening and consider raising head of bed.

## 2019-08-17 NOTE — Progress Notes (Signed)
Subjective:    [name], male    DOB: 01/27/1961, 58 y.o.   MRN: [id number]  Chief Complaint  Patient presents with  . Annual Exam    HPI Patient is in today for annual preventative exam and follow up on chronic medical concerns including hyperlipidemia, hypertension, hyperglycemia and more. He feels well. 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 He is otherwise managing quarantine, trying to eat well and stay active. He exercises at home. Denies CP/palp/SOB/HA/congestion/fevers/GI or GU c/o. Taking meds as prescribed  Past Medical History:  Diagnosis Date  . Allergy   . Constipation 12/25/2015  . Dysrhythmia    sinus arrythmia-2006-work up negative findings  . Hyperglycemia 03/15/2015  . Hyperlipemia   . Hypertension   . Leg pain, diffuse, right 04/15/2017  . Preventative health care 03/20/2015   Sees Dr Collene Mares of gastroenterology    Past Surgical History:  Procedure Laterality Date  . KNEE ARTHROSCOPY  2009   rt knee  . KNEE ARTHROSCOPY  01/16/2012   Procedure: ARTHROSCOPY KNEE;  Surgeon: Gearlean Alf, MD;  Location: [facility];  Service: Orthopedics;  Laterality: Left;  WITH DEBRIDEMENT  . VENTRAL HERNIA REPAIR      Family History  Problem Relation Age of Onset  . Hypertension Mother   . Diabetes Mother        labile  . Kidney disease Mother   . Peripheral vascular disease Mother   . Diabetes Maternal Aunt   . Heart disease Maternal Aunt   . Heart disease Father        MI?  . Drug abuse Son   . Heart disease Cousin   . Diabetes Cousin   . Alcohol abuse Cousin     Social History   Socioeconomic History  . Marital status: Married    Spouse name: Delora  . Number of children: Not on file  . Years of education: 69  . Highest education level: Not on file  Occupational History  . Occupation: Administrator for [employer]  . Financial resource strain: Not on file  . Food insecurity    Worry: Not on file     Inability: Not on file  . Transportation needs    Medical: Not on file    Non-medical: Not on file  Tobacco Use  . Smoking status: Never Smoker  . Smokeless tobacco: Never Used  Substance and Sexual Activity  . Alcohol use: No  . Drug use: No  . Sexual activity: Yes    Comment: lives with wife, no major dietary restrictions, works for [employer]  . Physical activity    Days per week: Not on file    Minutes per session: Not on file  . Stress: Not on file  Relationships  . Social Herbalist on phone: Not on file    Gets together: Not on file    Attends religious service: Not on file    Active member of club or organization: Not on file    Attends meetings of clubs or organizations: Not on file    Relationship status: Not on file  . Intimate partner violence    Fear of current or ex partner: Not on file    Emotionally abused: Not on file    Physically abused: Not on file    Forced sexual activity: Not on file  Other Topics Concern  . Not on file  Social  History Narrative  . Not on file    Outpatient Medications Prior to Visit  Medication Sig Dispense Refill  . aspirin 81 MG tablet Take 81 mg by mouth daily.    . fish oil-omega-3 fatty acids 1000 MG capsule Take 2 g by mouth daily.    . Garlic 10 MG CAPS Take by mouth.    . Multiple Vitamin (MULTIVITAMIN) tablet Take 1 tablet by mouth daily.    . Red Yeast Rice 600 MG CAPS Take 1 tablet by mouth daily.    . vitamin E 600 UNIT capsule Take 600 Units by mouth daily.    . hydrochlorothiazide (HYDRODIURIL) 25 MG tablet Take 1 tablet by mouth once daily 30 tablet 0  . lisinopril (ZESTRIL) 10 MG tablet Take 1 tablet by mouth once daily 30 tablet 0  . gabapentin (NEURONTIN) 100 MG capsule Take 1 capsule (100 mg total) by mouth 3 (three) times daily. (Patient not taking: Reported on 08/17/2019) 90 capsule 3  . HYDROcodone-acetaminophen (NORCO) 5-325 MG tablet Take 1 tablet by mouth every 6 (six) hours as needed  for moderate pain. (Patient not taking: Reported on 08/17/2019) 20 tablet 0  . meloxicam (MOBIC) 7.5 MG tablet Take 1 tablet (7.5 mg total) by mouth 2 (two) times daily as needed for pain. With food 40 tablet 1  . traMADol (ULTRAM) 50 MG tablet Take 1 tablet (50 mg total) by mouth every 6 (six) hours as needed for moderate pain. 16 tablet 0   No facility-administered medications prior to visit.     No Known Allergies  Review of Systems  Constitutional: Negative for chills, fever and malaise/fatigue.  HENT: Negative for congestion and hearing loss.   Eyes: Negative for discharge.  Respiratory: Negative for cough, sputum production and shortness of breath.   Cardiovascular: Negative for chest pain, palpitations and leg swelling.  Gastrointestinal: Negative for abdominal pain, blood in stool, constipation, diarrhea, heartburn, nausea and vomiting.  Genitourinary: Negative for dysuria, frequency, hematuria and urgency.  Musculoskeletal: Negative for back pain, falls and myalgias.  Skin: Negative for rash.  Neurological: Negative for dizziness, sensory change, loss of consciousness, weakness and headaches.  Endo/Heme/Allergies: Negative for environmental allergies. Does not bruise/bleed easily.  Psychiatric/Behavioral: Negative for depression and suicidal ideas. The patient is not nervous/anxious and does not have insomnia.        Objective:    Physical Exam Vitals signs and nursing note reviewed.  Constitutional:      General: He is not in acute distress.    Appearance: He is well-developed.  HENT:     Head: Normocephalic and atraumatic.     Right Ear: Tympanic membrane and ear canal normal.     Left Ear: Tympanic membrane and ear canal normal.     Nose: Nose normal.  Eyes:     General:        Right eye: No discharge.        Left eye: No discharge.  Neck:     Musculoskeletal: Normal range of motion and neck supple.  Cardiovascular:     Rate and Rhythm: Normal rate and regular  rhythm.     Heart sounds: No murmur.  Pulmonary:     Effort: Pulmonary effort is normal.     Breath sounds: Normal breath sounds. No wheezing.  Abdominal:     General: Bowel sounds are normal.     Palpations: Abdomen is soft.     Tenderness: There is no abdominal tenderness.  Skin:  General: Skin is warm and dry.  Neurological:     Mental Status: He is alert and oriented to person, place, and time.  Psychiatric:        Mood and Affect: Mood normal.        Behavior: Behavior normal.     BP 124/82 (BP Location: Left Arm, Patient Position: Sitting, Cuff Size: Normal)   Pulse 68   Temp (!) 96.4 F (35.8 C) (Temporal)   Resp 18   Ht 5\' 6"  (1.676 m)   Wt 165 lb 9.6 oz (75.1 kg)   SpO2 99%   BMI 26.73 kg/m  Wt Readings from Last 3 Encounters:  08/17/19 165 lb 9.6 oz (75.1 kg)  11/10/18 163 lb 6.4 oz (74.1 kg)  05/12/18 163 lb 9.6 oz (74.2 kg)    Diabetic Foot Exam - Simple   No data filed     Lab Results  Component Value Date   WBC 4.4 05/12/2018   HGB 15.0 05/12/2018   HCT 44.0 05/12/2018   PLT 190.0 05/12/2018   GLUCOSE 99 11/10/2018   CHOL 192 11/10/2018   TRIG 62.0 11/10/2018   HDL 57.80 11/10/2018   LDLCALC 122 (H) 11/10/2018   ALT 18 11/10/2018   AST 17 11/10/2018   NA 138 11/10/2018   K 3.9 11/10/2018   CL 99 11/10/2018   CREATININE 1.09 11/10/2018   BUN 16 11/10/2018   CO2 32 11/10/2018   TSH 1.23 05/12/2018   PSA 3.61 04/15/2017   HGBA1C 5.9 11/10/2018    Lab Results  Component Value Date   TSH 1.23 05/12/2018   Lab Results  Component Value Date   WBC 4.4 05/12/2018   HGB 15.0 05/12/2018   HCT 44.0 05/12/2018   MCV 88.0 05/12/2018   PLT 190.0 05/12/2018   Lab Results  Component Value Date   NA 138 11/10/2018   K 3.9 11/10/2018   CO2 32 11/10/2018   GLUCOSE 99 11/10/2018   BUN 16 11/10/2018   CREATININE 1.09 11/10/2018   BILITOT 0.6 11/10/2018   ALKPHOS 42 11/10/2018   AST 17 11/10/2018   ALT 18 11/10/2018   PROT 6.5 11/10/2018    ALBUMIN 4.0 11/10/2018   CALCIUM 9.2 11/10/2018   ANIONGAP 8 08/17/2017   GFR 88.91 11/10/2018   Lab Results  Component Value Date   CHOL 192 11/10/2018   Lab Results  Component Value Date   HDL 57.80 11/10/2018   Lab Results  Component Value Date   LDLCALC 122 (H) 11/10/2018   Lab Results  Component Value Date   TRIG 62.0 11/10/2018   Lab Results  Component Value Date   CHOLHDL 3 11/10/2018   Lab Results  Component Value Date   HGBA1C 5.9 11/10/2018       Assessment & Plan:   Problem List Items Addressed This Visit    Hyperlipemia    Tolerating statin, encouraged heart healthy diet, avoid trans fats, minimize simple carbs and saturated fats. Increase exercise as tolerated      Relevant Medications   hydrochlorothiazide (HYDRODIURIL) 25 MG tablet   lisinopril (ZESTRIL) 10 MG tablet   Other Relevant Orders   Lipid panel   Hypertension    Well controlled, no changes to meds. Encouraged heart healthy diet such as the DASH diet and exercise as tolerated.       Relevant Medications   hydrochlorothiazide (HYDRODIURIL) 25 MG tablet   lisinopril (ZESTRIL) 10 MG tablet   Other Relevant Orders   CBC  Comprehensive metabolic panel   TSH   Hyperglycemia    hgba1c acceptable, minimize simple carbs. Increase exercise as tolerated.       Preventative health care    Patient encouraged to maintain heart healthy diet, regular exercise, adequate sleep. Consider daily probiotics. Take medications as prescribed. Labs ordered and reviewed      Urinary frequency    Check PSA today      Acid reflux    Flared recently after eating. Avoid offending foods, start probiotics. Do not eat large meals in late evening and consider raising head of bed.       Relevant Medications   famotidine (PEPCID) 40 MG tablet    Other Visit Diagnoses    Nocturia    -  Primary   Relevant Orders   PSA      I have discontinued Dvante Harewood's meloxicam, traMADol, gabapentin, and  HYDROcodone-acetaminophen. I have also changed his hydrochlorothiazide and lisinopril. Additionally, I am having him start on famotidine. Lastly, I am having him maintain his Garlic, multivitamin, aspirin, fish oil-omega-3 fatty acids, Red Yeast Rice, and vitamin E.  Meds ordered this encounter  Medications  . famotidine (PEPCID) 40 MG tablet    Sig: Take 1 tablet (40 mg total) by mouth at bedtime as needed for heartburn or indigestion.    Dispense:  30 tablet    Refill:  5  . hydrochlorothiazide (HYDRODIURIL) 25 MG tablet    Sig: Take 1 tablet (25 mg total) by mouth daily.    Dispense:  90 tablet    Refill:  1  . lisinopril (ZESTRIL) 10 MG tablet    Sig: Take 1 tablet (10 mg total) by mouth daily.    Dispense:  90 tablet    Refill:  1     Penni Homans, MD

## 2019-08-17 NOTE — Assessment & Plan Note (Signed)
Well controlled, no changes to meds. Encouraged heart healthy diet such as the DASH diet and exercise as tolerated.  °

## 2019-08-17 NOTE — Assessment & Plan Note (Addendum)
Patient encouraged to maintain heart healthy diet, regular exercise, adequate sleep. Consider daily probiotics. Take medications as prescribed. Labs ordered and reviewed 

## 2019-08-17 NOTE — Assessment & Plan Note (Signed)
Check PSA today. 

## 2019-08-17 NOTE — Assessment & Plan Note (Signed)
hgba1c acceptable, minimize simple carbs. Increase exercise as tolerated.  

## 2019-08-17 NOTE — Patient Instructions (Addendum)
Multivitamin with minerals (selenium, zinc, vitamin c and vitamin D)   Preventive Care 60-60 Years Old, Male Preventive care refers to lifestyle choices and visits with your health care provider that can promote health and wellness. This includes:  A yearly physical exam. This is also called an annual well check.  Regular dental and eye exams.  Immunizations.  Screening for certain conditions.  Healthy lifestyle choices, such as eating a healthy diet, getting regular exercise, not using drugs or products that contain nicotine and tobacco, and limiting alcohol use. What can I expect for my preventive care visit? Physical exam Your health care provider will check:  Height and weight. These may be used to calculate body mass index (BMI), which is a measurement that tells if you are at a healthy weight.  Heart rate and blood pressure.  Your skin for abnormal spots. Counseling Your health care provider may ask you questions about:  Alcohol, tobacco, and drug use.  Emotional well-being.  Home and relationship well-being.  Sexual activity.  Eating habits.  Work and work Statistician. What immunizations do I need?  Influenza (flu) vaccine  This is recommended every year. Tetanus, diphtheria, and pertussis (Tdap) vaccine  You may need a Td booster every 10 years. Varicella (chickenpox) vaccine  You may need this vaccine if you have not already been vaccinated. Zoster (shingles) vaccine  You may need this after age 89. Measles, mumps, and rubella (MMR) vaccine  You may need at least one dose of MMR if you were born in 1957 or later. You may also need a second dose. Pneumococcal conjugate (PCV13) vaccine  You may need this if you have certain conditions and were not previously vaccinated. Pneumococcal polysaccharide (PPSV23) vaccine  You may need one or two doses if you smoke cigarettes or if you have certain conditions. Meningococcal conjugate (MenACWY) vaccine   You may need this if you have certain conditions. Hepatitis A vaccine  You may need this if you have certain conditions or if you travel or work in places where you may be exposed to hepatitis A. Hepatitis B vaccine  You may need this if you have certain conditions or if you travel or work in places where you may be exposed to hepatitis B. Haemophilus influenzae type b (Hib) vaccine  You may need this if you have certain risk factors. Human papillomavirus (HPV) vaccine  If recommended by your health care provider, you may need three doses over 6 months. You may receive vaccines as individual doses or as more than one vaccine together in one shot (combination vaccines). Talk with your health care provider about the risks and benefits of combination vaccines. What tests do I need? Blood tests  Lipid and cholesterol levels. These may be checked every 5 years, or more frequently if you are over 60 years old.  Hepatitis C test.  Hepatitis B test. Screening  Lung cancer screening. You may have this screening every year starting at age 60 if you have a 30-pack-year history of smoking and currently smoke or have quit within the past 15 years.  Prostate cancer screening. Recommendations will vary depending on your family history and other risks.  Colorectal cancer screening. All adults should have this screening starting at age 10 and continuing until age 60. Your health care provider may recommend screening at age 60 if you are at increased risk. You will have tests every 1-10 years, depending on your results and the type of screening test.  Diabetes screening. This is  done by checking your blood sugar (glucose) after you have not eaten for a while (fasting). You may have this done every 1-3 years.  Sexually transmitted disease (STD) testing. Follow these instructions at home: Eating and drinking  Eat a diet that includes fresh fruits and vegetables, whole grains, lean protein, and  low-fat dairy products.  Take vitamin and mineral supplements as recommended by your health care provider.  Do not drink alcohol if your health care provider tells you not to drink.  If you drink alcohol: ? Limit how much you have to 0-2 drinks a day. ? Be aware of how much alcohol is in your drink. In the [place] one drink equals one 12 oz bottle of beer (355 mL), one 5 oz glass of wine (148 mL), or one 1 oz glass of hard liquor (44 mL). Lifestyle  Take daily care of your teeth and gums.  Stay active. Exercise for at least 30 minutes on 5 or more days each week.  Do not use any products that contain nicotine or tobacco, such as cigarettes, e-cigarettes, and chewing tobacco. If you need help quitting, ask your health care provider.  If you are sexually active, practice safe sex. Use a condom or other form of protection to prevent STIs (sexually transmitted infections).  Talk with your health care provider about taking a low-dose aspirin every day starting at age 60. What's next?  Go to your health care provider once a year for a well check visit.  Ask your health care provider how often you should have your eyes and teeth checked.  Stay up to date on all vaccines. This information is not intended to replace advice given to you by your health care provider. Make sure you discuss any questions you have with your health care provider. Document Released: 12/09/2015 Document Revised: 11/06/2018 Document Reviewed: 11/06/2018 Elsevier Patient Education  2020 [employer].

## 2019-08-17 NOTE — Assessment & Plan Note (Signed)
Tolerating statin, encouraged heart healthy diet, avoid trans fats, minimize simple carbs and saturated fats. Increase exercise as tolerated 

## 2019-08-24 NOTE — Addendum Note (Signed)
Addended by: Magdalene Molly A on: 08/24/2019 03:22 PM   Modules accepted: Orders

## 2019-09-29 ENCOUNTER — Encounter: Payer: Self-pay | Admitting: Gastroenterology

## 2019-10-05 ENCOUNTER — Telehealth: Payer: Self-pay | Admitting: Gastroenterology

## 2019-10-05 ENCOUNTER — Encounter: Payer: Self-pay | Admitting: Family Medicine

## 2019-10-05 ENCOUNTER — Telehealth: Payer: Self-pay | Admitting: *Deleted

## 2019-10-05 NOTE — Telephone Encounter (Signed)
Copied from [place] [id number]. Topic: Referral - Status >> Oct 02, 2019  9:04 AM Scherrie Gerlach wrote: Reason for CRM: pt states he was all set to go to [facility] but they told him they have not received info from Dr Collene Mares. He thought he signed a release and had to cancel his 10/08/19 appt with them. Please advise

## 2019-10-05 NOTE — Telephone Encounter (Signed)
Patient stated that they called him back today and he was told that he was not due until 2022.  They found the report in the chart.  He also asked about urology referral.  He usually sees Dr. Carrie Mew at [facility].  Advised patient results were sent and he should be able to call and make appointment.

## 2019-10-05 NOTE — Telephone Encounter (Signed)
Thanks.  Colonoscopy done in 2012, a few small left sided hyperplastic polyps removed, no pre-cancerous changes, good prep. If the patient has no symptoms or family history of colon cancer then he should not be due for a colonoscopy until 2022, 10 years from his last exam. If he has a family history of colon cancer, or has symptoms that he wishes to have discussed, then I am happy to see him in clinic. Thanks

## 2019-10-05 NOTE — Telephone Encounter (Signed)
Hey Dr. Havery Moros- this patient Tommy Farrell is wanting to transfer his care to [place] from Dr. Collene Mares. He is being referred from his PCP for a colonoscopy but had one back in 2012. I am sending you his records for review. Please advise. Thank you!

## 2019-10-05 NOTE — Telephone Encounter (Signed)
Patient is aware that he is not due for colonoscopy until 2022. Patient is not having any symptoms and is aware that if any symptoms do develop to call back and we can schedule him to see Dr. Havery Moros. Patient states he is okay waiting until 2022 and will call back if he needs to schedule appointment earlier.

## 2019-10-19 ENCOUNTER — Encounter: Payer: Managed Care, Other (non HMO) | Admitting: Gastroenterology

## 2020-02-15 ENCOUNTER — Other Ambulatory Visit: Payer: Self-pay

## 2020-02-15 ENCOUNTER — Encounter: Payer: Self-pay | Admitting: Family Medicine

## 2020-02-15 ENCOUNTER — Ambulatory Visit (INDEPENDENT_AMBULATORY_CARE_PROVIDER_SITE_OTHER): Payer: Managed Care, Other (non HMO) | Admitting: Family Medicine

## 2020-02-15 DIAGNOSIS — Z7189 Other specified counseling: Secondary | ICD-10-CM

## 2020-02-15 DIAGNOSIS — I10 Essential (primary) hypertension: Secondary | ICD-10-CM

## 2020-02-15 DIAGNOSIS — R739 Hyperglycemia, unspecified: Secondary | ICD-10-CM

## 2020-02-15 DIAGNOSIS — E782 Mixed hyperlipidemia: Secondary | ICD-10-CM

## 2020-02-15 MED ORDER — FAMOTIDINE 40 MG PO TABS: 40.0000 mg | ORAL_TABLET | Freq: Every evening | ORAL | 1 refills | Status: DC | PRN

## 2020-02-15 MED ORDER — LISINOPRIL 10 MG PO TABS: 10.0000 mg | ORAL_TABLET | Freq: Every day | ORAL | 1 refills | Status: DC

## 2020-02-15 MED ORDER — HYDROCHLOROTHIAZIDE 25 MG PO TABS: 25.0000 mg | ORAL_TABLET | Freq: Every day | ORAL | 1 refills | Status: DC

## 2020-02-15 NOTE — Assessment & Plan Note (Signed)
hgba1c acceptable, minimize simple carbs. Increase exercise as tolerated.  

## 2020-02-15 NOTE — Progress Notes (Signed)
Virtual Visit via phone Note  I connected with Tommy Farrell on 02/15/20 at  8:40 AM EDT by a phone enabled telemedicine application and verified that I am speaking with the correct person using two identifiers.  Location: Patient: home Provider: home   I discussed the limitations of evaluation and management by telemedicine and the availability of in person appointments. The patient expressed understanding and agreed to proceed. Magdalene Molly, CMA was able to get the patient set up on visit, phone after being unable to set up a video visit    Subjective:    [name], male    DOB: 1961-10-11, 59 y.o.   MRN: [id number]  Chief Complaint  Patient presents with  . Follow-up    HPI Patient is in today for follow up on chronic medical concerns. He is doing well. No recent febrile illness or hospitalizations. He has been working throughout the pandemic. He has not lost anyone from [place] he is exercising regularly. Eating well. Denies CP/palp/SOB/HA/congestion/fevers/GI or GU c/o. Taking meds as prescribed. Has seen Dr Collene Mares of urology. No concerns regarding PSA  Past Medical History:  Diagnosis Date  . Allergy   . Constipation 12/25/2015  . Dysrhythmia    sinus arrythmia-2006-work up negative findings  . Hyperglycemia 03/15/2015  . Hyperlipemia   . Hypertension   . Leg pain, diffuse, right 04/15/2017  . Preventative health care 03/20/2015   Sees Dr Collene Mares of gastroenterology    Past Surgical History:  Procedure Laterality Date  . KNEE ARTHROSCOPY  2009   rt knee  . KNEE ARTHROSCOPY  01/16/2012   Procedure: ARTHROSCOPY KNEE;  Surgeon: Gearlean Alf, MD;  Location: [facility];  Service: Orthopedics;  Laterality: Left;  WITH DEBRIDEMENT  . VENTRAL HERNIA REPAIR      Family History  Problem Relation Age of Onset  . Hypertension Mother   . Diabetes Mother        labile  . Kidney disease Mother   . Peripheral vascular disease Mother   .  Diabetes Maternal Aunt   . Heart disease Maternal Aunt   . Heart disease Father        MI?  . Drug abuse Son   . Heart disease Cousin   . Diabetes Cousin   . Alcohol abuse Cousin     Social History   Socioeconomic History  . Marital status: Married    Spouse name: Delora  . Number of children: Not on file  . Years of education: 78  . Highest education level: Not on file  Occupational History  . Occupation: Administrator for [employer]  . Smoking status: Never Smoker  . Smokeless tobacco: Never Used  Substance and Sexual Activity  . Alcohol use: No  . Drug use: No  . Sexual activity: Yes    Comment: lives with wife, no major dietary restrictions, works for [employer]  Other Topics Concern  . Not on file  Social History Narrative  . Not on file   Social Determinants of Health   Financial Resource Strain:   . Difficulty of Paying Living Expenses:   Food Insecurity:   . Worried About Charity fundraiser in the Last Year:   . Arboriculturist in the Last Year:   Transportation Needs:   . Film/video editor (Medical):   Marland Kitchen Lack of Transportation (Non-Medical):   Physical Activity:   . Days of Exercise per Week:   . Minutes of  Exercise per Session:   Stress:   . Feeling of Stress :   Social Connections:   . Frequency of Communication with Friends and Family:   . Frequency of Social Gatherings with Friends and Family:   . Attends Religious Services:   . Active Member of Clubs or Organizations:   . Attends Archivist Meetings:   Marland Kitchen Marital Status:   Intimate Partner Violence:   . Fear of Current or Ex-Partner:   . Emotionally Abused:   Marland Kitchen Physically Abused:   . Sexually Abused:     Outpatient Medications Prior to Visit  Medication Sig Dispense Refill  . aspirin 81 MG tablet Take 81 mg by mouth daily.    . famotidine (PEPCID) 40 MG tablet Take 1 tablet (40 mg total) by mouth at bedtime as needed for heartburn or indigestion. 30 tablet 5  . fish  oil-omega-3 fatty acids 1000 MG capsule Take 2 g by mouth daily.    . Garlic 10 MG CAPS Take by mouth.    . hydrochlorothiazide (HYDRODIURIL) 25 MG tablet Take 1 tablet (25 mg total) by mouth daily. 90 tablet 1  . lisinopril (ZESTRIL) 10 MG tablet Take 1 tablet (10 mg total) by mouth daily. 90 tablet 1  . Multiple Vitamin (MULTIVITAMIN) tablet Take 1 tablet by mouth daily.    . Red Yeast Rice 600 MG CAPS Take 1 tablet by mouth daily.    . vitamin E 600 UNIT capsule Take 600 Units by mouth daily.     No facility-administered medications prior to visit.    No Known Allergies  Review of Systems  Constitutional: Negative for fever and malaise/fatigue.  HENT: Negative for congestion.   Eyes: Negative for blurred vision.  Respiratory: Negative for shortness of breath.   Cardiovascular: Negative for chest pain, palpitations and leg swelling.  Gastrointestinal: Negative for abdominal pain, blood in stool and nausea.  Genitourinary: Negative for dysuria and frequency.  Musculoskeletal: Negative for falls.  Skin: Negative for rash.  Neurological: Negative for dizziness, loss of consciousness and headaches.  Endo/Heme/Allergies: Negative for environmental allergies.  Psychiatric/Behavioral: Negative for depression. The patient is not nervous/anxious.        Objective:    Physical Exam unable to obtain via phone  BP 137/83   Wt 166 lb (75.3 kg)   BMI 26.79 kg/m  Wt Readings from Last 3 Encounters:  02/15/20 166 lb (75.3 kg)  08/17/19 165 lb 9.6 oz (75.1 kg)  11/10/18 163 lb 6.4 oz (74.1 kg)    Diabetic Foot Exam - Simple   No data filed     Lab Results  Component Value Date   WBC 4.6 08/17/2019   HGB 15.0 08/17/2019   HCT 44.9 08/17/2019   PLT 204.0 08/17/2019   GLUCOSE 96 08/17/2019   CHOL 186 08/17/2019   TRIG 63.0 08/17/2019   HDL 49.30 08/17/2019   LDLCALC 124 (H) 08/17/2019   ALT 22 08/17/2019   AST 18 08/17/2019   NA 138 08/17/2019   K 4.1 08/17/2019   CL 100  08/17/2019   CREATININE 1.06 08/17/2019   BUN 17 08/17/2019   CO2 32 08/17/2019   TSH 1.26 08/17/2019   PSA 4.17 (H) 08/17/2019   HGBA1C 5.9 11/10/2018    Lab Results  Component Value Date   TSH 1.26 08/17/2019   Lab Results  Component Value Date   WBC 4.6 08/17/2019   HGB 15.0 08/17/2019   HCT 44.9 08/17/2019   MCV 89.1 08/17/2019  PLT 204.0 08/17/2019   Lab Results  Component Value Date   NA 138 08/17/2019   K 4.1 08/17/2019   CO2 32 08/17/2019   GLUCOSE 96 08/17/2019   BUN 17 08/17/2019   CREATININE 1.06 08/17/2019   BILITOT 0.4 08/17/2019   ALKPHOS 57 08/17/2019   AST 18 08/17/2019   ALT 22 08/17/2019   PROT 6.5 08/17/2019   ALBUMIN 3.9 08/17/2019   CALCIUM 9.5 08/17/2019   ANIONGAP 8 08/17/2017   GFR 86.16 08/17/2019   Lab Results  Component Value Date   CHOL 186 08/17/2019   Lab Results  Component Value Date   HDL 49.30 08/17/2019   Lab Results  Component Value Date   LDLCALC 124 (H) 08/17/2019   Lab Results  Component Value Date   TRIG 63.0 08/17/2019   Lab Results  Component Value Date   CHOLHDL 4 08/17/2019   Lab Results  Component Value Date   HGBA1C 5.9 11/10/2018       Assessment & Plan:   Problem List Items Addressed This Visit    Hyperlipemia    Encouraged heart healthy diet, increase exercise, avoid trans fats, consider a krill oil cap daily      Hypertension    Well controlled, no changes to meds. Encouraged heart healthy diet such as the DASH diet and exercise as tolerated.       Hyperglycemia    hgba1c acceptable, minimize simple carbs. Increase exercise as tolerated.       Educated about COVID-19 virus infection    Omron Blood Pressure cuff, upper arm, want BP 100-140/60-90 Pulse oximeter, want oxygen in 90s  Weekly vitals  Take Multivitamin with minerals, selenium Vitamin D 1000-2000 IU daily Probiotic with lactobacillus and bifidophilus Asprin EC 81 mg daily  Melatonin 2-5 mg at  bedtime  [email or web address]         I am having Fawn Kirk maintain his Garlic, multivitamin, aspirin, fish oil-omega-3 fatty acids, Red Yeast Rice, vitamin E, famotidine, hydrochlorothiazide, and lisinopril.  No orders of the defined types were placed in this encounter.    I discussed the assessment and treatment plan with the patient. The patient was provided an opportunity to ask questions and all were answered. The patient agreed with the plan and demonstrated an understanding of the instructions.   The patient was advised to call back or seek an in-person evaluation if the symptoms worsen or if the condition fails to improve as anticipated.  I provided 25 minutes of non-face-to-face time during this encounter.   Penni Homans, MD

## 2020-02-15 NOTE — Patient Instructions (Addendum)
Omron Blood Pressure cuff, upper arm, want BP 100-140/60-90 Pulse oximeter, want oxygen in 90s  Weekly vitals  Take Multivitamin with minerals, selenium Vitamin D 1000-2000 IU daily Probiotic with lactobacillus and bifidophilus Asprin EC 81 mg daily Fish oil or krill oil Melatonin 2-5 mg at bedtime  [email or web address] 

## 2020-02-15 NOTE — Assessment & Plan Note (Signed)
Well controlled, no changes to meds. Encouraged heart healthy diet such as the DASH diet and exercise as tolerated.  °

## 2020-02-15 NOTE — Addendum Note (Signed)
Addended by: Kelle Darting A on: 02/15/2020 03:02 PM   Modules accepted: Orders

## 2020-02-15 NOTE — Assessment & Plan Note (Signed)
Encouraged heart healthy diet, increase exercise, avoid trans fats, consider a krill oil cap daily 

## 2020-02-15 NOTE — Assessment & Plan Note (Signed)
Omron Blood Pressure cuff, upper arm, want BP 100-140/60-90 Pulse oximeter, want oxygen in 90s  Weekly vitals  Take Multivitamin with minerals, selenium Vitamin D 1000-2000 IU daily Probiotic with lactobacillus and bifidophilus Asprin EC 81 mg daily  Melatonin 2-5 mg at bedtime  [email or web address] 

## 2020-02-16 LAB — COMPREHENSIVE METABOLIC PANEL
ALT: 24 U/L (ref 0–53)
AST: 20 U/L (ref 0–37)
Albumin: 4.3 g/dL (ref 3.5–5.2)
Alkaline Phosphatase: 58 U/L (ref 39–117)
BUN: 21 mg/dL (ref 6–23)
CO2: 29 mEq/L (ref 19–32)
Calcium: 9.5 mg/dL (ref 8.4–10.5)
Chloride: 102 mEq/L (ref 96–112)
Creatinine, Ser: 1.17 mg/dL (ref 0.40–1.50)
GFR: 76.75 mL/min (ref >60.00)
Glucose, Bld: 93 mg/dL (ref 70–99)
Potassium: 3.8 mEq/L (ref 3.5–5.1)
Sodium: 138 mEq/L (ref 135–145)
Total Bilirubin: 0.4 mg/dL (ref 0.2–1.2)
Total Protein: 6.9 g/dL (ref 6.0–8.3)

## 2020-02-16 LAB — LIPID PANEL
Cholesterol: 211 mg/dL — ABNORMAL HIGH (ref 0–200)
HDL: 55.2 mg/dL (ref >39.00)
LDL Cholesterol: 132 mg/dL — ABNORMAL HIGH (ref 0–99)
NonHDL: 155.47
Total CHOL/HDL Ratio: 4
Triglycerides: 115 mg/dL (ref 0.0–149.0)
VLDL: 23 mg/dL (ref 0.0–40.0)

## 2020-02-16 LAB — HEMOGLOBIN A1C: Hgb A1c MFr Bld: 5.7 % (ref 4.6–6.5)

## 2020-02-16 LAB — CBC
HCT: 43.3 % (ref 39.0–52.0)
Hemoglobin: 14.6 g/dL (ref 13.0–17.0)
MCHC: 33.8 g/dL (ref 30.0–36.0)
MCV: 88 fl (ref 78.0–100.0)
Platelets: 239 10*3/uL (ref 150.0–400.0)
RBC: 4.92 Mil/uL (ref 4.22–5.81)
RDW: 14.5 % (ref 11.5–15.5)
WBC: 4.7 10*3/uL (ref 4.0–10.5)

## 2020-02-16 LAB — TSH: TSH: 2.6 u[IU]/mL (ref 0.35–4.50)

## 2020-02-17 ENCOUNTER — Other Ambulatory Visit: Payer: Self-pay | Admitting: *Deleted

## 2020-02-17 DIAGNOSIS — E782 Mixed hyperlipidemia: Secondary | ICD-10-CM

## 2020-02-17 DIAGNOSIS — I10 Essential (primary) hypertension: Secondary | ICD-10-CM

## 2020-02-18 ENCOUNTER — Other Ambulatory Visit: Payer: Self-pay | Admitting: Family Medicine

## 2020-05-23 ENCOUNTER — Other Ambulatory Visit: Payer: Self-pay

## 2020-05-23 ENCOUNTER — Other Ambulatory Visit (INDEPENDENT_AMBULATORY_CARE_PROVIDER_SITE_OTHER): Payer: Managed Care, Other (non HMO)

## 2020-05-23 DIAGNOSIS — E782 Mixed hyperlipidemia: Secondary | ICD-10-CM

## 2020-05-23 DIAGNOSIS — I10 Essential (primary) hypertension: Secondary | ICD-10-CM

## 2020-05-23 LAB — LIPID PANEL
Cholesterol: 217 mg/dL — ABNORMAL HIGH (ref 0–200)
HDL: 48.4 mg/dL (ref >39.00)
LDL Cholesterol: 153 mg/dL — ABNORMAL HIGH (ref 0–99)
NonHDL: 168.71
Total CHOL/HDL Ratio: 4
Triglycerides: 81 mg/dL (ref 0.0–149.0)
VLDL: 16.2 mg/dL (ref 0.0–40.0)

## 2020-05-23 LAB — COMPREHENSIVE METABOLIC PANEL
ALT: 24 U/L (ref 0–53)
AST: 28 U/L (ref 0–37)
Albumin: 4.2 g/dL (ref 3.5–5.2)
Alkaline Phosphatase: 56 U/L (ref 39–117)
BUN: 23 mg/dL (ref 6–23)
CO2: 30 mEq/L (ref 19–32)
Calcium: 9.2 mg/dL (ref 8.4–10.5)
Chloride: 100 mEq/L (ref 96–112)
Creatinine, Ser: 1.08 mg/dL (ref 0.40–1.50)
GFR: 84.11 mL/min (ref >60.00)
Glucose, Bld: 107 mg/dL — ABNORMAL HIGH (ref 70–99)
Potassium: 3.6 mEq/L (ref 3.5–5.1)
Sodium: 138 mEq/L (ref 135–145)
Total Bilirubin: 0.5 mg/dL (ref 0.2–1.2)
Total Protein: 6.6 g/dL (ref 6.0–8.3)

## 2020-08-22 ENCOUNTER — Encounter: Payer: Self-pay | Admitting: Family Medicine

## 2020-08-22 ENCOUNTER — Other Ambulatory Visit: Payer: Self-pay

## 2020-08-22 ENCOUNTER — Ambulatory Visit (INDEPENDENT_AMBULATORY_CARE_PROVIDER_SITE_OTHER): Payer: Managed Care, Other (non HMO) | Admitting: Family Medicine

## 2020-08-22 VITALS — BP 131/70 | HR 71 | Temp 98.2°F | Resp 12 | Ht 66.0 in | Wt 165.8 lb

## 2020-08-22 DIAGNOSIS — T7840XA Allergy, unspecified, initial encounter: Secondary | ICD-10-CM

## 2020-08-22 DIAGNOSIS — R739 Hyperglycemia, unspecified: Secondary | ICD-10-CM | POA: Diagnosis not present

## 2020-08-22 DIAGNOSIS — Z0000 Encounter for general adult medical examination without abnormal findings: Secondary | ICD-10-CM | POA: Diagnosis not present

## 2020-08-22 DIAGNOSIS — E782 Mixed hyperlipidemia: Secondary | ICD-10-CM

## 2020-08-22 DIAGNOSIS — I10 Essential (primary) hypertension: Secondary | ICD-10-CM

## 2020-08-22 MED ORDER — LISINOPRIL 10 MG PO TABS
10.0000 mg | ORAL_TABLET | Freq: Every day | ORAL | 1 refills | Status: DC
Start: 2020-08-22 — End: 2020-08-27

## 2020-08-22 MED ORDER — HYDROCHLOROTHIAZIDE 25 MG PO TABS
25.0000 mg | ORAL_TABLET | Freq: Every day | ORAL | 1 refills | Status: DC
Start: 2020-08-22 — End: 2020-10-17

## 2020-08-22 NOTE — Assessment & Plan Note (Signed)
Encouraged heart healthy diet, increase exercise, avoid trans fats, consider a krill oil cap daily 

## 2020-08-22 NOTE — Assessment & Plan Note (Signed)
Tends to flare this time of year. Encouraged Nasal saline bid and prn and if still symptomatic can consider Zyrtec and Flonase.

## 2020-08-22 NOTE — Assessment & Plan Note (Signed)
Well controlled, no changes to meds. Encouraged heart healthy diet such as the DASH diet and exercise as tolerated.  °

## 2020-08-22 NOTE — Progress Notes (Signed)
Subjective:    [name], male    DOB: March 04, 1961, 59 y.o.   MRN: [id number]  Chief Complaint  Patient presents with  . Annual Exam    fasting    HPI Patient is in today for annual preventative exam and follow up on chronic medical concerns. No recent febrile illness or hospitalizations. His allergies flare this time of year and he is noting some mild congestion and eye itching. Has not tried any meds. He notes his knees are bothering him but not enough to have anything. Continues to exercise and try and eat well. Tolerated his COVID shots. Denies CP/palp/SOB/HA/fevers/GI or GU c/o. Taking meds as prescribed.  Past Medical History:  Diagnosis Date  . Allergy   . Constipation 12/25/2015  . Dysrhythmia    sinus arrythmia-2006-work up negative findings  . Hyperglycemia 03/15/2015  . Hyperlipemia   . Hypertension   . Leg pain, diffuse, right 04/15/2017  . Preventative health care 03/20/2015   Sees Dr Collene Mares of gastroenterology    Past Surgical History:  Procedure Laterality Date  . KNEE ARTHROSCOPY  2009   rt knee  . KNEE ARTHROSCOPY  01/16/2012   Procedure: ARTHROSCOPY KNEE;  Surgeon: Gearlean Alf, MD;  Location: [facility];  Service: Orthopedics;  Laterality: Left;  WITH DEBRIDEMENT  . VENTRAL HERNIA REPAIR      Family History  Problem Relation Age of Onset  . Hypertension Mother   . Diabetes Mother        labile  . Kidney disease Mother   . Peripheral vascular disease Mother   . Diabetes Maternal Aunt   . Heart disease Maternal Aunt   . Heart disease Father        MI?  . Drug abuse Son   . Heart disease Cousin   . Diabetes Cousin   . Alcohol abuse Cousin     Social History   Socioeconomic History  . Marital status: Married    Spouse name: Delora  . Number of children: Not on file  . Years of education: 46  . Highest education level: Not on file  Occupational History  . Occupation: Administrator for [employer]  .  Smoking status: Never Smoker  . Smokeless tobacco: Never Used  Vaping Use  . Vaping Use: Never used  Substance and Sexual Activity  . Alcohol use: No  . Drug use: No  . Sexual activity: Yes    Comment: lives with wife, no major dietary restrictions, works for [employer]  Other Topics Concern  . Not on file  Social History Narrative  . Not on file   Social Determinants of Health   Financial Resource Strain:   . Difficulty of Paying Living Expenses: Not on file  Food Insecurity:   . Worried About Charity fundraiser in the Last Year: Not on file  . Ran Out of Food in the Last Year: Not on file  Transportation Needs:   . Lack of Transportation (Medical): Not on file  . Lack of Transportation (Non-Medical): Not on file  Physical Activity:   . Days of Exercise per Week: Not on file  . Minutes of Exercise per Session: Not on file  Stress:   . Feeling of Stress : Not on file  Social Connections:   . Frequency of Communication with Friends and Family: Not on file  . Frequency of Social Gatherings with Friends and Family: Not on file  . Attends Religious Services:  Not on file  . Active Member of Clubs or Organizations: Not on file  . Attends Archivist Meetings: Not on file  . Marital Status: Not on file  Intimate Partner Violence:   . Fear of Current or Ex-Partner: Not on file  . Emotionally Abused: Not on file  . Physically Abused: Not on file  . Sexually Abused: Not on file    Outpatient Medications Prior to Visit  Medication Sig Dispense Refill  . aspirin 81 MG tablet Take 81 mg by mouth daily.    . famotidine (PEPCID) 40 MG tablet Take 1 tablet (40 mg total) by mouth at bedtime as needed for heartburn or indigestion. 90 tablet 1  . fish oil-omega-3 fatty acids 1000 MG capsule Take 2 g by mouth daily.    . Garlic 10 MG CAPS Take by mouth.    . Multiple Vitamin (MULTIVITAMIN) tablet Take 1 tablet by mouth daily.    . Red Yeast Rice 600 MG CAPS Take 1 tablet by mouth  daily.    . vitamin E 600 UNIT capsule Take 600 Units by mouth daily.    . hydrochlorothiazide (HYDRODIURIL) 25 MG tablet Take 1 tablet by mouth once daily 90 tablet 1  . lisinopril (ZESTRIL) 10 MG tablet Take 1 tablet (10 mg total) by mouth daily. 90 tablet 1   No facility-administered medications prior to visit.    No Known Allergies  Review of Systems  Constitutional: Negative for fever.  HENT: Negative for congestion.   Eyes: Negative for blurred vision.  Respiratory: Negative for cough and shortness of breath.   Cardiovascular: Negative for chest pain and palpitations.  Gastrointestinal: Negative for diarrhea and vomiting.  Genitourinary: Negative for frequency.  Musculoskeletal: Negative for back pain.  Skin: Negative for rash.  Neurological: Negative for loss of consciousness and headaches.  Endo/Heme/Allergies: Positive for environmental allergies.  Psychiatric/Behavioral: Negative for depression.       Objective:    Physical Exam  BP 131/70 (BP Location: Left Arm, Patient Position: Sitting, Cuff Size: Small)   Pulse 71   Temp 98.2 F (36.8 C) (Oral)   Resp 12   Ht 5\' 6"  (1.676 m)   Wt 165 lb 12.8 oz (75.2 kg)   SpO2 98%   BMI 26.76 kg/m  Wt Readings from Last 3 Encounters:  08/22/20 165 lb 12.8 oz (75.2 kg)  02/15/20 166 lb (75.3 kg)  08/17/19 165 lb 9.6 oz (75.1 kg)    Diabetic Foot Exam - Simple   No data filed     Lab Results  Component Value Date   WBC 4.7 02/15/2020   HGB 14.6 02/15/2020   HCT 43.3 02/15/2020   PLT 239.0 02/15/2020   GLUCOSE 107 (H) 05/23/2020   CHOL 217 (H) 05/23/2020   TRIG 81.0 05/23/2020   HDL 48.40 05/23/2020   LDLCALC 153 (H) 05/23/2020   ALT 24 05/23/2020   AST 28 05/23/2020   NA 138 05/23/2020   K 3.6 05/23/2020   CL 100 05/23/2020   CREATININE 1.08 05/23/2020   BUN 23 05/23/2020   CO2 30 05/23/2020   TSH 2.60 02/15/2020   PSA 4.17 (H) 08/17/2019   HGBA1C 5.7 02/15/2020    Lab Results  Component Value  Date   TSH 2.60 02/15/2020   Lab Results  Component Value Date   WBC 4.7 02/15/2020   HGB 14.6 02/15/2020   HCT 43.3 02/15/2020   MCV 88.0 02/15/2020   PLT 239.0 02/15/2020   Lab Results  Component Value Date   NA 138 05/23/2020   K 3.6 05/23/2020   CO2 30 05/23/2020   GLUCOSE 107 (H) 05/23/2020   BUN 23 05/23/2020   CREATININE 1.08 05/23/2020   BILITOT 0.5 05/23/2020   ALKPHOS 56 05/23/2020   AST 28 05/23/2020   ALT 24 05/23/2020   PROT 6.6 05/23/2020   ALBUMIN 4.2 05/23/2020   CALCIUM 9.2 05/23/2020   ANIONGAP 8 08/17/2017   GFR 84.11 05/23/2020   Lab Results  Component Value Date   CHOL 217 (H) 05/23/2020   Lab Results  Component Value Date   HDL 48.40 05/23/2020   Lab Results  Component Value Date   LDLCALC 153 (H) 05/23/2020   Lab Results  Component Value Date   TRIG 81.0 05/23/2020   Lab Results  Component Value Date   CHOLHDL 4 05/23/2020   Lab Results  Component Value Date   HGBA1C 5.7 02/15/2020       Assessment & Plan:   Problem List Items Addressed This Visit    Hyperlipemia    Encouraged heart healthy diet, increase exercise, avoid trans fats, consider a krill oil cap daily      Relevant Medications   hydrochlorothiazide (HYDRODIURIL) 25 MG tablet   lisinopril (ZESTRIL) 10 MG tablet   Other Relevant Orders   Lipid panel   Hypertension    Well controlled, no changes to meds. Encouraged heart healthy diet such as the DASH diet and exercise as tolerated.       Relevant Medications   hydrochlorothiazide (HYDRODIURIL) 25 MG tablet   lisinopril (ZESTRIL) 10 MG tablet   Other Relevant Orders   CBC   Comprehensive metabolic panel   TSH   Allergy    Tends to flare this time of year. Encouraged Nasal saline bid and prn and if still symptomatic can consider Zyrtec and Flonase.       Hyperglycemia    hgba1c acceptable, minimize simple carbs. Increase exercise as tolerated.       Relevant Orders   Hemoglobin A1c   Preventative  health care    Patient encouraged to maintain heart healthy diet, regular exercise, adequate sleep. Consider daily probiotics. Take medications as prescribed. Labs ordered and reviewed. Colonoscopy due in 2022. He declines flu shot today         I have changed Margaret Deakin's hydrochlorothiazide. I am also having him maintain his Garlic, multivitamin, aspirin, fish oil-omega-3 fatty acids, Red Yeast Rice, vitamin E, famotidine, and lisinopril.  Meds ordered this encounter  Medications  . hydrochlorothiazide (HYDRODIURIL) 25 MG tablet    Sig: Take 1 tablet (25 mg total) by mouth daily.    Dispense:  90 tablet    Refill:  1  . lisinopril (ZESTRIL) 10 MG tablet    Sig: Take 1 tablet (10 mg total) by mouth daily.    Dispense:  90 tablet    Refill:  1     Penni Homans, MD

## 2020-08-22 NOTE — Patient Instructions (Addendum)
Nasal saline twice a day and as needed Flonase/Fluticasone daily and Zyrtec/Cetirizine 10 mg daily all over the counter   Preventive Care 1-61 Years Old, Male Preventive care refers to lifestyle choices and visits with your health care provider that can promote health and wellness. This includes:  A yearly physical exam. This is also called an annual well check.  Regular dental and eye exams.  Immunizations.  Screening for certain conditions.  Healthy lifestyle choices, such as eating a healthy diet, getting regular exercise, not using drugs or products that contain nicotine and tobacco, and limiting alcohol use. What can I expect for my preventive care visit? Physical exam Your health care provider will check:  Height and weight. These may be used to calculate body mass index (BMI), which is a measurement that tells if you are at a healthy weight.  Heart rate and blood pressure.  Your skin for abnormal spots. Counseling Your health care provider may ask you questions about:  Alcohol, tobacco, and drug use.  Emotional well-being.  Home and relationship well-being.  Sexual activity.  Eating habits.  Work and work Statistician. What immunizations do I need?  Influenza (flu) vaccine  This is recommended every year. Tetanus, diphtheria, and pertussis (Tdap) vaccine  You may need a Td booster every 10 years. Varicella (chickenpox) vaccine  You may need this vaccine if you have not already been vaccinated. Zoster (shingles) vaccine  You may need this after age 23. Measles, mumps, and rubella (MMR) vaccine  You may need at least one dose of MMR if you were born in 1957 or later. You may also need a second dose. Pneumococcal conjugate (PCV13) vaccine  You may need this if you have certain conditions and were not previously vaccinated. Pneumococcal polysaccharide (PPSV23) vaccine  You may need one or two doses if you smoke cigarettes or if you have certain  conditions. Meningococcal conjugate (MenACWY) vaccine  You may need this if you have certain conditions. Hepatitis A vaccine  You may need this if you have certain conditions or if you travel or work in places where you may be exposed to hepatitis A. Hepatitis B vaccine  You may need this if you have certain conditions or if you travel or work in places where you may be exposed to hepatitis B. Haemophilus influenzae type b (Hib) vaccine  You may need this if you have certain risk factors. Human papillomavirus (HPV) vaccine  If recommended by your health care provider, you may need three doses over 6 months. You may receive vaccines as individual doses or as more than one vaccine together in one shot (combination vaccines). Talk with your health care provider about the risks and benefits of combination vaccines. What tests do I need? Blood tests  Lipid and cholesterol levels. These may be checked every 5 years, or more frequently if you are over 32 years old.  Hepatitis C test.  Hepatitis B test. Screening  Lung cancer screening. You may have this screening every year starting at age 58 if you have a 30-pack-year history of smoking and currently smoke or have quit within the past 15 years.  Prostate cancer screening. Recommendations will vary depending on your family history and other risks.  Colorectal cancer screening. All adults should have this screening starting at age 22 and continuing until age 54. Your health care provider may recommend screening at age 16 if you are at increased risk. You will have tests every 1-10 years, depending on your results and the  type of screening test.  Diabetes screening. This is done by checking your blood sugar (glucose) after you have not eaten for a while (fasting). You may have this done every 1-3 years.  Sexually transmitted disease (STD) testing. Follow these instructions at home: Eating and drinking  Eat a diet that includes fresh  fruits and vegetables, whole grains, lean protein, and low-fat dairy products.  Take vitamin and mineral supplements as recommended by your health care provider.  Do not drink alcohol if your health care provider tells you not to drink.  If you drink alcohol: ? Limit how much you have to 0-2 drinks a day. ? Be aware of how much alcohol is in your drink. In the [place] one drink equals one 12 oz bottle of beer (355 mL), one 5 oz glass of wine (148 mL), or one 1 oz glass of hard liquor (44 mL). Lifestyle  Take daily care of your teeth and gums.  Stay active. Exercise for at least 30 minutes on 5 or more days each week.  Do not use any products that contain nicotine or tobacco, such as cigarettes, e-cigarettes, and chewing tobacco. If you need help quitting, ask your health care provider.  If you are sexually active, practice safe sex. Use a condom or other form of protection to prevent STIs (sexually transmitted infections).  Talk with your health care provider about taking a low-dose aspirin every day starting at age 2. What's next?  Go to your health care provider once a year for a well check visit.  Ask your health care provider how often you should have your eyes and teeth checked.  Stay up to date on all vaccines. This information is not intended to replace advice given to you by your health care provider. Make sure you discuss any questions you have with your health care provider. Document Revised: 11/06/2018 Document Reviewed: 11/06/2018 Elsevier Patient Education  2020 [employer].

## 2020-08-22 NOTE — Assessment & Plan Note (Addendum)
Patient encouraged to maintain heart healthy diet, regular exercise, adequate sleep. Consider daily probiotics. Take medications as prescribed. Labs ordered and reviewed. Colonoscopy due in 2022. He declines flu shot today

## 2020-08-22 NOTE — Assessment & Plan Note (Signed)
hgba1c acceptable, minimize simple carbs. Increase exercise as tolerated.  

## 2020-08-23 LAB — COMPREHENSIVE METABOLIC PANEL
AG Ratio: 1.7 (calc) (ref 1.0–2.5)
ALT: 24 U/L (ref 9–46)
AST: 21 U/L (ref 10–35)
Albumin: 4.3 g/dL (ref 3.6–5.1)
Alkaline phosphatase (APISO): 59 U/L (ref 35–144)
BUN: 17 mg/dL (ref 7–25)
CO2: 29 mmol/L (ref 20–32)
Calcium: 9.5 mg/dL (ref 8.6–10.3)
Chloride: 101 mmol/L (ref 98–110)
Creat: 1.14 mg/dL (ref 0.70–1.25)
Globulin: 2.6 g/dL (calc) (ref 1.9–3.7)
Glucose, Bld: 96 mg/dL (ref 65–99)
Potassium: 4.1 mmol/L (ref 3.5–5.3)
Sodium: 139 mmol/L (ref 135–146)
Total Bilirubin: 0.5 mg/dL (ref 0.2–1.2)
Total Protein: 6.9 g/dL (ref 6.1–8.1)

## 2020-08-23 LAB — CBC
HCT: 45.1 % (ref 38.5–50.0)
Hemoglobin: 15.4 g/dL (ref 13.2–17.1)
MCH: 29.8 pg (ref 27.0–33.0)
MCHC: 34.1 g/dL (ref 32.0–36.0)
MCV: 87.2 fL (ref 80.0–100.0)
MPV: 10 fL (ref 7.5–12.5)
Platelets: 215 10*3/uL (ref 140–400)
RBC: 5.17 10*6/uL (ref 4.20–5.80)
RDW: 13.5 % (ref 11.0–15.0)
WBC: 4.4 10*3/uL (ref 3.8–10.8)

## 2020-08-23 LAB — LIPID PANEL
Cholesterol: 220 mg/dL — ABNORMAL HIGH (ref <200)
HDL: 57 mg/dL (ref >=40)
LDL Cholesterol (Calc): 148 mg/dL (calc) — ABNORMAL HIGH
Non-HDL Cholesterol (Calc): 163 mg/dL (calc) — ABNORMAL HIGH (ref <130)
Total CHOL/HDL Ratio: 3.9 (calc) (ref <5.0)
Triglycerides: 58 mg/dL (ref <150)

## 2020-08-23 LAB — HEMOGLOBIN A1C
Hgb A1c MFr Bld: 5.7 % of total Hgb — ABNORMAL HIGH (ref <5.7)
Mean Plasma Glucose: 117 (calc)
eAG (mmol/L): 6.5 (calc)

## 2020-08-23 LAB — TSH: TSH: 1.5 mIU/L (ref 0.40–4.50)

## 2020-08-27 ENCOUNTER — Other Ambulatory Visit: Payer: Self-pay | Admitting: Family Medicine

## 2020-10-16 ENCOUNTER — Other Ambulatory Visit: Payer: Self-pay | Admitting: Family Medicine

## 2020-10-28 ENCOUNTER — Other Ambulatory Visit: Payer: Self-pay | Admitting: Family Medicine

## 2021-02-20 ENCOUNTER — Ambulatory Visit (INDEPENDENT_AMBULATORY_CARE_PROVIDER_SITE_OTHER): Payer: Managed Care, Other (non HMO) | Admitting: Family Medicine

## 2021-02-20 ENCOUNTER — Other Ambulatory Visit: Payer: Self-pay

## 2021-02-20 ENCOUNTER — Telehealth: Payer: Self-pay | Admitting: Family Medicine

## 2021-02-20 ENCOUNTER — Encounter: Payer: Self-pay | Admitting: Family Medicine

## 2021-02-20 DIAGNOSIS — R972 Elevated prostate specific antigen [PSA]: Secondary | ICD-10-CM | POA: Diagnosis not present

## 2021-02-20 DIAGNOSIS — E782 Mixed hyperlipidemia: Secondary | ICD-10-CM | POA: Diagnosis not present

## 2021-02-20 DIAGNOSIS — R739 Hyperglycemia, unspecified: Secondary | ICD-10-CM

## 2021-02-20 DIAGNOSIS — I10 Essential (primary) hypertension: Secondary | ICD-10-CM

## 2021-02-20 LAB — CBC
HCT: 46.1 % (ref 39.0–52.0)
Hemoglobin: 15.5 g/dL (ref 13.0–17.0)
MCHC: 33.6 g/dL (ref 30.0–36.0)
MCV: 88.7 fl (ref 78.0–100.0)
Platelets: 202 10*3/uL (ref 150.0–400.0)
RBC: 5.2 Mil/uL (ref 4.22–5.81)
RDW: 14.9 % (ref 11.5–15.5)
WBC: 4.8 10*3/uL (ref 4.0–10.5)

## 2021-02-20 LAB — COMPREHENSIVE METABOLIC PANEL
ALT: 28 U/L (ref 0–53)
AST: 20 U/L (ref 0–37)
Albumin: 4.4 g/dL (ref 3.5–5.2)
Alkaline Phosphatase: 57 U/L (ref 39–117)
BUN: 21 mg/dL (ref 6–23)
CO2: 32 mEq/L (ref 19–32)
Calcium: 9.8 mg/dL (ref 8.4–10.5)
Chloride: 101 mEq/L (ref 96–112)
Creatinine, Ser: 1.09 mg/dL (ref 0.40–1.50)
GFR: 73.13 mL/min (ref >60.00)
Glucose, Bld: 83 mg/dL (ref 70–99)
Potassium: 4.2 mEq/L (ref 3.5–5.1)
Sodium: 140 mEq/L (ref 135–145)
Total Bilirubin: 0.4 mg/dL (ref 0.2–1.2)
Total Protein: 7 g/dL (ref 6.0–8.3)

## 2021-02-20 LAB — LIPID PANEL
Cholesterol: 238 mg/dL — ABNORMAL HIGH (ref 0–200)
HDL: 54.7 mg/dL (ref >39.00)
LDL Cholesterol: 173 mg/dL — ABNORMAL HIGH (ref 0–99)
NonHDL: 183.43
Total CHOL/HDL Ratio: 4
Triglycerides: 51 mg/dL (ref 0.0–149.0)
VLDL: 10.2 mg/dL (ref 0.0–40.0)

## 2021-02-20 LAB — TSH: TSH: 1.17 u[IU]/mL (ref 0.35–4.50)

## 2021-02-20 LAB — HEMOGLOBIN A1C: Hgb A1c MFr Bld: 6 % (ref 4.6–6.5)

## 2021-02-20 NOTE — Assessment & Plan Note (Signed)
hgba1c acceptable, minimize simple carbs. Increase exercise as tolerated.  

## 2021-02-20 NOTE — Assessment & Plan Note (Signed)
Well controlled, no changes to meds. Encouraged heart healthy diet such as the DASH diet and exercise as tolerated.  °

## 2021-02-20 NOTE — Patient Instructions (Signed)
High Cholesterol  High cholesterol is a condition in which the blood has high levels of a white, waxy substance similar to fat (cholesterol). The liver makes all the cholesterol that the body needs. The human body needs small amounts of cholesterol to help build cells. A person gets extra or excess cholesterol from the food that he or she eats. The blood carries cholesterol from the liver to the rest of the body. If you have high cholesterol, deposits (plaques) may build up on the walls of your arteries. Arteries are the blood vessels that carry blood away from your heart. These plaques make the arteries narrow and stiff. Cholesterol plaques increase your risk for heart attack and stroke. Work with your health care provider to keep your cholesterol levels in a healthy range. What increases the risk? The following factors may make you more likely to develop this condition:  Eating foods that are high in animal fat (saturated fat) or cholesterol.  Being overweight.  Not getting enough exercise.  A family history of high cholesterol (familial hypercholesterolemia).  Use of tobacco products.  Having diabetes. What are the signs or symptoms? There are no symptoms of this condition. How is this diagnosed? This condition may be diagnosed based on the results of a blood test.  If you are older than 62 years of age, your health care provider may check your cholesterol levels every 4-6 years.  You may be checked more often if you have high cholesterol or other risk factors for heart disease. The blood test for cholesterol measures:  "Bad" cholesterol, or LDL cholesterol. This is the main type of cholesterol that causes heart disease. The desired level is less than 100 mg/dL.  "Good" cholesterol, or HDL cholesterol. HDL helps protect against heart disease by cleaning the arteries and carrying the LDL to the liver for processing. The desired level for HDL is 60 mg/dL or higher.  Triglycerides.  These are fats that your body can store or burn for energy. The desired level is less than 150 mg/dL.  Total cholesterol. This measures the total amount of cholesterol in your blood and includes LDL, HDL, and triglycerides. The desired level is less than 200 mg/dL. How is this treated? This condition may be treated with:  Diet changes. You may be asked to eat foods that have more fiber and less saturated fats or added sugar.  Lifestyle changes. These may include regular exercise, maintaining a healthy weight, and quitting use of tobacco products.  Medicines. These are given when diet and lifestyle changes have not worked. You may be prescribed a statin medicine to help lower your cholesterol levels. Follow these instructions at home: Eating and drinking  Eat a healthy, balanced diet. This diet includes: ? Daily servings of a variety of fresh, frozen, or canned fruits and vegetables. ? Daily servings of whole grain foods that are rich in fiber. ? Foods that are low in saturated fats and trans fats. These include poultry and fish without skin, lean cuts of meat, and low-fat dairy products. ? A variety of fish, especially oily fish that contain omega-3 fatty acids. Aim to eat fish at least 2 times a week.  Avoid foods and drinks that have added sugar.  Use healthy cooking methods, such as roasting, grilling, broiling, baking, poaching, steaming, and stir-frying. Do not fry your food except for stir-frying.   Lifestyle  Get regular exercise. Aim to exercise for a total of 150 minutes a week. Increase your activity level by doing activities   such as gardening, walking, and taking the stairs.  Do not use any products that contain nicotine or tobacco, such as cigarettes, e-cigarettes, and chewing tobacco. If you need help quitting, ask your health care provider.   General instructions  Take over-the-counter and prescription medicines only as told by your health care provider.  Keep all  follow-up visits as told by your health care provider. This is important. Where to find more information  American Heart Association: www.heart.org  National Heart, Lung, and Blood Institute: www.nhlbi.nih.gov Contact a health care provider if:  You have trouble achieving or maintaining a healthy diet or weight.  You are starting an exercise program.  You are unable to stop smoking. Get help right away if:  You have chest pain.  You have trouble breathing.  You have any symptoms of a stroke. "BE FAST" is an easy way to remember the main warning signs of a stroke: ? B - Balance. Signs are dizziness, sudden trouble walking, or loss of balance. ? E - Eyes. Signs are trouble seeing or a sudden change in vision. ? F - Face. Signs are sudden weakness or numbness of the face, or the face or eyelid drooping on one side. ? A - Arms. Signs are weakness or numbness in an arm. This happens suddenly and usually on one side of the body. ? S - Speech. Signs are sudden trouble speaking, slurred speech, or trouble understanding what people say. ? T - Time. Time to call emergency services. Write down what time symptoms started.  You have other signs of a stroke, such as: ? A sudden, severe headache with no known cause. ? Nausea or vomiting. ? Seizure. These symptoms may represent a serious problem that is an emergency. Do not wait to see if the symptoms will go away. Get medical help right away. Call your local emergency services (911 in the [place] Do not drive yourself to the hospital. Summary  Cholesterol plaques increase your risk for heart attack and stroke. Work with your health care provider to keep your cholesterol levels in a healthy range.  Eat a healthy, balanced diet, get regular exercise, and maintain a healthy weight.  Do not use any products that contain nicotine or tobacco, such as cigarettes, e-cigarettes, and chewing tobacco.  Get help right away if you have any symptoms of a  stroke. This information is not intended to replace advice given to you by your health care provider. Make sure you discuss any questions you have with your health care provider. Document Revised: 10/12/2019 Document Reviewed: 10/12/2019 Elsevier Patient Education  2021 Elsevier Inc.  

## 2021-02-20 NOTE — Assessment & Plan Note (Signed)
Following with Dr Collene Mares, [facility] has been stable

## 2021-02-20 NOTE — Telephone Encounter (Signed)
FYI  Patient would like to let you know Dr. Lemar Livings will be doing his colonoscopy

## 2021-02-20 NOTE — Progress Notes (Signed)
Subjective:    [name], male    DOB: 06-09-1961, 60 y.o.   MRN: [id number]  Chief Complaint  Patient presents with  . Follow-up    HPI Patient is in today for follow up on chronic medical concerns. No recent febrile illness or hospitalizations. No polyuria or polydipsia. He continues to follow annually with [facility]. His only acute concern is sometimes he wakes up in the morning with the 5 th toe on his left foot being numb. But then as soon as he gets up it resolves  Past Medical History:  Diagnosis Date  . Allergy   . Constipation 12/25/2015  . Dysrhythmia    sinus arrythmia-2006-work up negative findings  . Hyperglycemia 03/15/2015  . Hyperlipemia   . Hypertension   . Leg pain, diffuse, right 04/15/2017  . Preventative health care 03/20/2015   Sees Dr Collene Mares of gastroenterology    Past Surgical History:  Procedure Laterality Date  . KNEE ARTHROSCOPY  2009   rt knee  . KNEE ARTHROSCOPY  01/16/2012   Procedure: ARTHROSCOPY KNEE;  Surgeon: Gearlean Alf, MD;  Location: [facility];  Service: Orthopedics;  Laterality: Left;  WITH DEBRIDEMENT  . VENTRAL HERNIA REPAIR      Family History  Problem Relation Age of Onset  . Hypertension Mother   . Diabetes Mother        labile  . Kidney disease Mother   . Peripheral vascular disease Mother   . Diabetes Maternal Aunt   . Heart disease Maternal Aunt   . Heart disease Father        MI?  . Drug abuse Son   . Heart disease Cousin   . Diabetes Cousin   . Alcohol abuse Cousin     Social History   Socioeconomic History  . Marital status: Married    Spouse name: Delora  . Number of children: Not on file  . Years of education: 27  . Highest education level: Not on file  Occupational History  . Occupation: Administrator for [employer]  . Smoking status: Never Smoker  . Smokeless tobacco: Never Used  Vaping Use  . Vaping Use: Never used  Substance and Sexual Activity   . Alcohol use: No  . Drug use: No  . Sexual activity: Yes    Comment: lives with wife, no major dietary restrictions, works for [employer]  Other Topics Concern  . Not on file  Social History Narrative  . Not on file   Social Determinants of Health   Financial Resource Strain: Not on file  Food Insecurity: Not on file  Transportation Needs: Not on file  Physical Activity: Not on file  Stress: Not on file  Social Connections: Not on file  Intimate Partner Violence: Not on file    Outpatient Medications Prior to Visit  Medication Sig Dispense Refill  . aspirin 81 MG tablet Take 81 mg by mouth daily.    . famotidine (PEPCID) 40 MG tablet Take 1 tablet (40 mg total) by mouth at bedtime as needed for heartburn or indigestion. 90 tablet 1  . fish oil-omega-3 fatty acids 1000 MG capsule Take 2 g by mouth daily.    . Garlic 10 MG CAPS Take by mouth.    . hydrochlorothiazide (HYDRODIURIL) 25 MG tablet Take 1 tablet by mouth once daily 30 tablet 0  . lisinopril (ZESTRIL) 10 MG tablet Take 1 tablet by mouth once daily 30 tablet 0  .  Multiple Vitamin (MULTIVITAMIN) tablet Take 1 tablet by mouth daily.    . Red Yeast Rice 600 MG CAPS Take 1 tablet by mouth daily.    . vitamin E 600 UNIT capsule Take 600 Units by mouth daily.     No facility-administered medications prior to visit.    No Known Allergies  Review of Systems  Constitutional: Negative for fever.  HENT: Negative for congestion.   Eyes: Negative for blurred vision.  Respiratory: Negative for cough.   Cardiovascular: Negative for chest pain and palpitations.  Gastrointestinal: Negative for vomiting.  Musculoskeletal: Negative for back pain.  Skin: Negative for rash.  Neurological: Positive for sensory change. Negative for loss of consciousness and headaches.        Objective:    Physical Exam Vitals and nursing note reviewed.  Constitutional:      General: He is not in acute distress.    Appearance: He is  well-developed.  HENT:     Head: Normocephalic and atraumatic.     Nose: Nose normal.  Eyes:     General:        Right eye: No discharge.        Left eye: No discharge.  Cardiovascular:     Rate and Rhythm: Normal rate and regular rhythm.     Heart sounds: No murmur heard.   Pulmonary:     Effort: Pulmonary effort is normal.     Breath sounds: Normal breath sounds.  Abdominal:     General: Bowel sounds are normal.     Palpations: Abdomen is soft.     Tenderness: There is no abdominal tenderness.  Musculoskeletal:     Cervical back: Normal range of motion and neck supple.  Skin:    General: Skin is warm and dry.  Neurological:     Mental Status: He is alert and oriented to person, place, and time.     BP 122/78   Pulse 74   Temp 98 F (36.7 C)   Resp 16   Wt 167 lb 6.4 oz (75.9 kg)   SpO2 99%   BMI 27.02 kg/m  Wt Readings from Last 3 Encounters:  02/20/21 167 lb 6.4 oz (75.9 kg)  08/22/20 165 lb 12.8 oz (75.2 kg)  02/15/20 166 lb (75.3 kg)    Diabetic Foot Exam - Simple   No data filed    Lab Results  Component Value Date   WBC 4.4 08/22/2020   HGB 15.4 08/22/2020   HCT 45.1 08/22/2020   PLT 215 08/22/2020   GLUCOSE 96 08/22/2020   CHOL 220 (H) 08/22/2020   TRIG 58 08/22/2020   HDL 57 08/22/2020   LDLCALC 148 (H) 08/22/2020   ALT 24 08/22/2020   AST 21 08/22/2020   NA 139 08/22/2020   K 4.1 08/22/2020   CL 101 08/22/2020   CREATININE 1.14 08/22/2020   BUN 17 08/22/2020   CO2 29 08/22/2020   TSH 1.50 08/22/2020   PSA 4.17 (H) 08/17/2019   HGBA1C 5.7 (H) 08/22/2020    Lab Results  Component Value Date   TSH 1.50 08/22/2020   Lab Results  Component Value Date   WBC 4.4 08/22/2020   HGB 15.4 08/22/2020   HCT 45.1 08/22/2020   MCV 87.2 08/22/2020   PLT 215 08/22/2020   Lab Results  Component Value Date   NA 139 08/22/2020   K 4.1 08/22/2020   CO2 29 08/22/2020   GLUCOSE 96 08/22/2020   BUN 17 08/22/2020  CREATININE 1.14 08/22/2020    BILITOT 0.5 08/22/2020   ALKPHOS 56 05/23/2020   AST 21 08/22/2020   ALT 24 08/22/2020   PROT 6.9 08/22/2020   ALBUMIN 4.2 05/23/2020   CALCIUM 9.5 08/22/2020   ANIONGAP 8 08/17/2017   GFR 84.11 05/23/2020   Lab Results  Component Value Date   CHOL 220 (H) 08/22/2020   Lab Results  Component Value Date   HDL 57 08/22/2020   Lab Results  Component Value Date   LDLCALC 148 (H) 08/22/2020   Lab Results  Component Value Date   TRIG 58 08/22/2020   Lab Results  Component Value Date   CHOLHDL 3.9 08/22/2020   Lab Results  Component Value Date   HGBA1C 5.7 (H) 08/22/2020       Assessment & Plan:   Problem List Items Addressed This Visit    Hyperlipemia    Encouraged heart healthy diet, increase exercise, avoid trans fats, consider a krill oil cap daily      Relevant Orders   Lipid panel   Hypertension    Well controlled, no changes to meds. Encouraged heart healthy diet such as the DASH diet and exercise as tolerated.       Relevant Orders   CBC   Comprehensive metabolic panel   TSH   Hyperglycemia    hgba1c acceptable, minimize simple carbs. Increase exercise as tolerated.       Relevant Orders   Hemoglobin A1c   Elevated PSA    Following with Dr Collene Mares, [facility] has been stable         I have discontinued Mliss Fritz Karras's vitamin E. I am also having him maintain his Garlic, multivitamin, aspirin, fish oil-omega-3 fatty acids, Red Yeast Rice, famotidine, lisinopril, and hydrochlorothiazide.  No orders of the defined types were placed in this encounter.    Penni Homans, MD

## 2021-02-20 NOTE — Assessment & Plan Note (Signed)
Encouraged heart healthy diet, increase exercise, avoid trans fats, consider a krill oil cap daily 

## 2021-02-22 ENCOUNTER — Other Ambulatory Visit: Payer: Self-pay

## 2021-02-22 DIAGNOSIS — R739 Hyperglycemia, unspecified: Secondary | ICD-10-CM

## 2021-02-22 DIAGNOSIS — E782 Mixed hyperlipidemia: Secondary | ICD-10-CM

## 2021-03-22 ENCOUNTER — Encounter: Payer: Self-pay | Admitting: *Deleted

## 2021-05-06 ENCOUNTER — Other Ambulatory Visit: Payer: Self-pay | Admitting: Family Medicine

## 2021-05-08 ENCOUNTER — Other Ambulatory Visit: Payer: Self-pay | Admitting: Family Medicine

## 2021-05-25 ENCOUNTER — Other Ambulatory Visit: Payer: Managed Care, Other (non HMO)

## 2021-06-16 ENCOUNTER — Other Ambulatory Visit (INDEPENDENT_AMBULATORY_CARE_PROVIDER_SITE_OTHER): Payer: Managed Care, Other (non HMO)

## 2021-06-16 ENCOUNTER — Other Ambulatory Visit: Payer: Self-pay

## 2021-06-16 DIAGNOSIS — R739 Hyperglycemia, unspecified: Secondary | ICD-10-CM | POA: Diagnosis not present

## 2021-06-16 DIAGNOSIS — E782 Mixed hyperlipidemia: Secondary | ICD-10-CM

## 2021-06-16 LAB — LIPID PANEL
Cholesterol: 183 mg/dL (ref 0–200)
HDL: 52.8 mg/dL (ref >39.00)
LDL Cholesterol: 106 mg/dL — ABNORMAL HIGH (ref 0–99)
NonHDL: 129.74
Total CHOL/HDL Ratio: 3
Triglycerides: 119 mg/dL (ref 0.0–149.0)
VLDL: 23.8 mg/dL (ref 0.0–40.0)

## 2021-06-16 LAB — CBC WITH DIFFERENTIAL/PLATELET
Basophils Absolute: 0 10*3/uL (ref 0.0–0.1)
Basophils Relative: 0.9 % (ref 0.0–3.0)
Eosinophils Absolute: 0.2 10*3/uL (ref 0.0–0.7)
Eosinophils Relative: 4.5 % (ref 0.0–5.0)
HCT: 44.4 % (ref 39.0–52.0)
Hemoglobin: 14.8 g/dL (ref 13.0–17.0)
Lymphocytes Relative: 40.2 % (ref 12.0–46.0)
Lymphs Abs: 1.8 10*3/uL (ref 0.7–4.0)
MCHC: 33.5 g/dL (ref 30.0–36.0)
MCV: 88.8 fl (ref 78.0–100.0)
Monocytes Absolute: 0.5 10*3/uL (ref 0.1–1.0)
Monocytes Relative: 10.5 % (ref 3.0–12.0)
Neutro Abs: 2 10*3/uL (ref 1.4–7.7)
Neutrophils Relative %: 43.9 % (ref 43.0–77.0)
Platelets: 184 10*3/uL (ref 150.0–400.0)
RBC: 4.99 Mil/uL (ref 4.22–5.81)
RDW: 14.8 % (ref 11.5–15.5)
WBC: 4.5 10*3/uL (ref 4.0–10.5)

## 2021-06-16 LAB — COMPREHENSIVE METABOLIC PANEL
ALT: 29 U/L (ref 0–53)
AST: 18 U/L (ref 0–37)
Albumin: 3.8 g/dL (ref 3.5–5.2)
Alkaline Phosphatase: 53 U/L (ref 39–117)
BUN: 19 mg/dL (ref 6–23)
CO2: 32 mEq/L (ref 19–32)
Calcium: 9.1 mg/dL (ref 8.4–10.5)
Chloride: 101 mEq/L (ref 96–112)
Creatinine, Ser: 1.11 mg/dL (ref 0.40–1.50)
GFR: 71.39 mL/min (ref >60.00)
Glucose, Bld: 131 mg/dL — ABNORMAL HIGH (ref 70–99)
Potassium: 3.7 mEq/L (ref 3.5–5.1)
Sodium: 141 mEq/L (ref 135–145)
Total Bilirubin: 0.5 mg/dL (ref 0.2–1.2)
Total Protein: 6.1 g/dL (ref 6.0–8.3)

## 2021-06-16 LAB — HEMOGLOBIN A1C: Hgb A1c MFr Bld: 6 % (ref 4.6–6.5)

## 2021-07-07 ENCOUNTER — Other Ambulatory Visit: Payer: Self-pay | Admitting: Family Medicine

## 2021-07-07 ENCOUNTER — Telehealth: Payer: Self-pay

## 2021-07-07 MED ORDER — TIZANIDINE HCL 2 MG PO TABS: 2.0000 mg | ORAL_TABLET | Freq: Two times a day (BID) | ORAL | 0 refills | Status: DC | PRN

## 2021-07-07 NOTE — Telephone Encounter (Signed)
Patient called requested call back from Dr. Frederik Pear assistant. I offered my help--patient states he tested positive for covid Wednesday. He is experiencing back pain and wonders if this is normal for covid positive patients. He states he had a fever earlier this week but no fever recently. No cough. He states he is taking mucinex but wants to know what to do about the back pain.   [facility] on [address].

## 2021-07-11 ENCOUNTER — Telehealth: Payer: Self-pay

## 2021-07-11 NOTE — Telephone Encounter (Signed)
Patient is seeing you tomorrow.  Are you ok with with coming in?  Patient was changed to VV.

## 2021-07-11 NOTE — Telephone Encounter (Signed)
Patient states he needs a call back in regards to his covid test still testing positive after a week of getting over covid, states no symptoms just wants to know how long it will show positive and if he needs to do anything in particular

## 2021-07-12 ENCOUNTER — Other Ambulatory Visit: Payer: Self-pay

## 2021-07-12 ENCOUNTER — Telehealth (INDEPENDENT_AMBULATORY_CARE_PROVIDER_SITE_OTHER): Payer: Managed Care, Other (non HMO) | Admitting: Family

## 2021-07-12 VITALS — Temp 96.6°F

## 2021-07-12 DIAGNOSIS — U071 COVID-19: Secondary | ICD-10-CM | POA: Diagnosis not present

## 2021-07-12 DIAGNOSIS — Z8616 Personal history of COVID-19: Secondary | ICD-10-CM | POA: Insufficient documentation

## 2021-07-12 NOTE — Progress Notes (Signed)
MyChart Video Visit    Virtual Visit via Video Note   This visit type was conducted due to national recommendations for restrictions regarding the COVID-19 Pandemic (e.g. social distancing) in an effort to limit this patient's exposure and mitigate transmission in our community. This patient is at least at moderate risk for complications without adequate follow up. This format is felt to be most appropriate for this patient at this time. Physical exam was limited by quality of the video and audio technology used for the visit. CMA was able to get the patient set up on a video visit.  Patient location: Home Patient and provider in visit Provider location: Office  I discussed the limitations of evaluation and management by telemedicine and the availability of in person appointments. The patient expressed understanding and agreed to proceed.  Visit Date: 07/12/2021  Today's healthcare provider: Nance Pear, NP     Subjective:    [name], male    DOB: November 16, 1961, 60 y.o.   MRN: [id number]  Chief Complaint  Patient presents with  . Covid Positive    Tested positive 07/06/21 and again 07/11/21  . Covid symptoms    Patient reports most of his symptoms are gone, still has a little bit of congestion    HPI Patient is in today for a video visit.  His wife started showing COVID symptoms last Sunday on 07/02/2021 and tested positive on Tuesday 07/04/2021. He began having symptoms on 8/8 and tested positive on 8/9.  His initial symptoms included: congestion and headaches on Sunday and later also felt fatigue and developed coughing the next day. He is not experiencing any symptoms at this time.  Past Medical History:  Diagnosis Date  . Allergy   . Constipation 12/25/2015  . Dysrhythmia    sinus arrythmia-2006-work up negative findings  . Hyperglycemia 03/15/2015  . Hyperlipemia   . Hypertension   . Leg pain, diffuse, right 04/15/2017  . Preventative  health care 03/20/2015   Sees Dr Collene Mares of gastroenterology    Past Surgical History:  Procedure Laterality Date  . KNEE ARTHROSCOPY  2009   rt knee  . KNEE ARTHROSCOPY  01/16/2012   Procedure: ARTHROSCOPY KNEE;  Surgeon: Gearlean Alf, MD;  Location: [facility];  Service: Orthopedics;  Laterality: Left;  WITH DEBRIDEMENT  . VENTRAL HERNIA REPAIR      Family History  Problem Relation Age of Onset  . Hypertension Mother   . Diabetes Mother        labile  . Kidney disease Mother   . Peripheral vascular disease Mother   . Diabetes Maternal Aunt   . Heart disease Maternal Aunt   . Heart disease Father        MI?  . Drug abuse Son   . Heart disease Cousin   . Diabetes Cousin   . Alcohol abuse Cousin     Social History   Socioeconomic History  . Marital status: Married    Spouse name: Delora  . Number of children: Not on file  . Years of education: 46  . Highest education level: Not on file  Occupational History  . Occupation: Administrator for [employer]  . Smoking status: Never  . Smokeless tobacco: Never  Vaping Use  . Vaping Use: Never used  Substance and Sexual Activity  . Alcohol use: No  . Drug use: No  . Sexual activity: Yes    Comment: lives with wife, no  major dietary restrictions, works for [employer]  Other Topics Concern  . Not on file  Social History Narrative  . Not on file   Social Determinants of Health   Financial Resource Strain: Not on file  Food Insecurity: Not on file  Transportation Needs: Not on file  Physical Activity: Not on file  Stress: Not on file  Social Connections: Not on file  Intimate Partner Violence: Not on file    Outpatient Medications Prior to Visit  Medication Sig Dispense Refill  . aspirin 81 MG tablet Take 81 mg by mouth daily.    . famotidine (PEPCID) 40 MG tablet Take 1 tablet (40 mg total) by mouth at bedtime as needed for heartburn or indigestion. 90 tablet 1  . fish oil-omega-3 fatty acids  1000 MG capsule Take 2 g by mouth daily.    . Garlic 10 MG CAPS Take by mouth.    . hydrochlorothiazide (HYDRODIURIL) 25 MG tablet Take 1 tablet by mouth once daily 30 tablet 0  . lisinopril (ZESTRIL) 10 MG tablet Take 1 tablet by mouth once daily 90 tablet 0  . Multiple Vitamin (MULTIVITAMIN) tablet Take 1 tablet by mouth daily.    . Red Yeast Rice 600 MG CAPS Take 1 tablet by mouth daily.    Marland Kitchen tiZANidine (ZANAFLEX) 2 MG tablet Take 1 tablet (2 mg total) by mouth 2 (two) times daily as needed for muscle spasms. 30 tablet 0   No facility-administered medications prior to visit.    No Known Allergies  ROS    See HPI Objective:    Physical Exam Constitutional:      Appearance: Normal appearance.  HENT:     Head: Normocephalic and atraumatic.     Right Ear: External ear normal.     Left Ear: External ear normal.  Neurological:     Mental Status: He is alert.  Psychiatric:        Behavior: Behavior normal.        Judgment: Judgment normal.    Temp (!) 96.6 F (35.9 C) (Temporal)  Wt Readings from Last 3 Encounters:  02/20/21 167 lb 6.4 oz (75.9 kg)  08/22/20 165 lb 12.8 oz (75.2 kg)  02/15/20 166 lb (75.3 kg)      Assessment & Plan:   Problem List Items Addressed This Visit       Unprioritized   COVID-19 virus infection - Primary    Clinically resolved.  Advised of CDC guidelines for self isolation/ ending isolation.  Advised of safe practice guidelines. Symptom Tier reviewed. Encouraged to monitor for any new or worsening symptoms; watch for increased shortness of breath, weakness, and signs of dehydration. Advised when to seek emergency care.  Instructed to rest and hydrate well. Advised pt to continue to quarantine through tomorrow and then he can lift quarantine Friday if he continues to feel well.        No orders of the defined types were placed in this encounter.   I discussed the assessment and treatment plan with the patient. The patient was provided an  opportunity to ask questions and all were answered. The patient agreed with the plan and demonstrated an understanding of the instructions.   The patient was advised to call back or seek an in-person evaluation if the symptoms worsen or if the condition fails to improve as anticipated.   I,[name],acting as a Education administrator for [employer], NP.,have documented all relevant documentation on the behalf of Nance Pear, NP,as  directed by  Nance Pear, NP while in the presence of Nance Pear, NP.   I provided 20 minutes of face-to-face time during this encounter.   Nance Pear, NP [employer] [facility] [employer] [phone] (phone) [phone] (fax)  [place]

## 2021-07-12 NOTE — Assessment & Plan Note (Signed)
Clinically resolved.  Advised of CDC guidelines for self isolation/ ending isolation.  Advised of safe practice guidelines. Symptom Tier reviewed. Encouraged to monitor for any new or worsening symptoms; watch for increased shortness of breath, weakness, and signs of dehydration. Advised when to seek emergency care.  Instructed to rest and hydrate well. Advised pt to continue to quarantine through tomorrow and then he can lift quarantine Friday if he continues to feel well.

## 2021-07-12 NOTE — Telephone Encounter (Signed)
Let's leave it as virtual please.

## 2021-08-06 ENCOUNTER — Other Ambulatory Visit: Payer: Self-pay | Admitting: Family Medicine

## 2021-08-25 ENCOUNTER — Encounter: Payer: Self-pay | Admitting: *Deleted

## 2021-08-25 LAB — HM COLONOSCOPY

## 2021-09-11 ENCOUNTER — Other Ambulatory Visit: Payer: Self-pay

## 2021-09-11 ENCOUNTER — Encounter: Payer: Self-pay | Admitting: Family Medicine

## 2021-09-11 ENCOUNTER — Ambulatory Visit (INDEPENDENT_AMBULATORY_CARE_PROVIDER_SITE_OTHER): Payer: Managed Care, Other (non HMO) | Admitting: Family Medicine

## 2021-09-11 ENCOUNTER — Ambulatory Visit: Payer: Managed Care, Other (non HMO) | Attending: Internal Medicine

## 2021-09-11 VITALS — BP 122/76 | HR 71 | Temp 98.0°F | Resp 16 | Ht 66.0 in | Wt 166.4 lb

## 2021-09-11 DIAGNOSIS — R739 Hyperglycemia, unspecified: Secondary | ICD-10-CM | POA: Diagnosis not present

## 2021-09-11 DIAGNOSIS — I10 Essential (primary) hypertension: Secondary | ICD-10-CM

## 2021-09-11 DIAGNOSIS — E782 Mixed hyperlipidemia: Secondary | ICD-10-CM | POA: Diagnosis not present

## 2021-09-11 DIAGNOSIS — R972 Elevated prostate specific antigen [PSA]: Secondary | ICD-10-CM

## 2021-09-11 DIAGNOSIS — Z23 Encounter for immunization: Secondary | ICD-10-CM

## 2021-09-11 DIAGNOSIS — K429 Umbilical hernia without obstruction or gangrene: Secondary | ICD-10-CM | POA: Insufficient documentation

## 2021-09-11 DIAGNOSIS — Z0000 Encounter for general adult medical examination without abnormal findings: Secondary | ICD-10-CM

## 2021-09-11 DIAGNOSIS — M79672 Pain in left foot: Secondary | ICD-10-CM | POA: Diagnosis not present

## 2021-09-11 MED ORDER — LISINOPRIL 10 MG PO TABS: 10.0000 mg | ORAL_TABLET | Freq: Every day | ORAL | 1 refills | Status: DC

## 2021-09-11 MED ORDER — HYDROCHLOROTHIAZIDE 25 MG PO TABS: 25.0000 mg | ORAL_TABLET | Freq: Every day | ORAL | 1 refills | Status: DC

## 2021-09-11 NOTE — Assessment & Plan Note (Signed)
Pain at base of great toe, no injury, no redness or lesions. Change in shoes has helped some. Try inserts, ice and topical rubs consider referral to podiatry.

## 2021-09-11 NOTE — Progress Notes (Signed)
   Covid-19 Vaccination Clinic  Name:  Tommy Farrell    MRN: [id number] DOB: Jul 28, 1961  09/11/2021  Tommy Farrell was observed post Covid-19 immunization for 15 minutes without incident. He was provided with Vaccine Information Sheet and instruction to access the V-Safe system.   Tommy Farrell was instructed to call 911 with any severe reactions post vaccine: Difficulty breathing  Swelling of face and throat  A fast heartbeat  A bad rash all over body  Dizziness and weakness

## 2021-09-11 NOTE — Assessment & Plan Note (Signed)
Follows with urology and stable, asymptomatic

## 2021-09-11 NOTE — Assessment & Plan Note (Signed)
Patient encouraged to maintain heart healthy diet, regular exercise, adequate sleep. Consider daily probiotics. Take medications as prescribed. Labs reviewed 

## 2021-09-11 NOTE — Assessment & Plan Note (Signed)
Encourage heart healthy diet such as MIND or DASH diet, increase exercise, avoid trans fats, simple carbohydrates and processed foods, consider a krill or fish or flaxseed oil cap daily.  °

## 2021-09-11 NOTE — Assessment & Plan Note (Signed)
hgba1c acceptable, minimize simple carbs. Increase exercise as tolerated.  

## 2021-09-11 NOTE — Assessment & Plan Note (Signed)
Well controlled, no changes to meds. Encouraged heart healthy diet such as the DASH diet and exercise as tolerated.  °

## 2021-09-11 NOTE — Progress Notes (Signed)
Subjective:   By signing my name below, I, Tommy Farrell, attest that this documentation has been prepared under the direction and in the presence of Tommy Lukes, MD. 09/11/2021    [name], male    DOB: [birth year], 60 y.o.   MRN: [id number]  Chief Complaint  Patient presents with   Annual Exam    HPI Patient is in today for a comprehensive physical exam.  He has been experiencing intermittent pain on the base of his left big toe for the past 7-8 months. He used to wear 2 pairs of socks but reducing it to 1 pair has helped to manage the pain. Changing shoes has also helped. He denies falls and a new exercise regimen. No redness, warmth or rough surface on toe. He does not think it is bad enough to see a podiatrist.  He does not get 6-8 hours of sleep. He gets about 4 hours of sleep because he has been used to working night shifts.  He has been seeing his urologist for the past 5 years and has been getting a consistent good PSA reading.   He checks his blood pressure at home and has not gotten any worrisome readings. However, when he goes for his physical at hs workplace, his blood pressure is a little high.  He denies fever, congestion, eye pain, chest pain, palpitations, leg swelling, shortness of breath, nausea, abdominal pain, diarrhea and blood in stool. Also denies dysuria, frequency, back pain and headaches.   He is requesting a refill on 10 g lisinopril and 25 mg hydrodiuril.   He is not willing to get the flu vaccine at this visit. He has 3 Pfizer Covid-19 vaccines at this time and will get the 2nd booster vaccine today.  There has been no hearing or vision changes. He is UTD on vision care.   There has been no recent changes in his family medical history.  Past Medical History:  Diagnosis Date   Allergy    Constipation 12/25/2015   Dysrhythmia    sinus arrythmia-2006-work up negative findings   Hyperglycemia 03/15/2015   Hyperlipemia     Hypertension    Leg pain, diffuse, right 04/15/2017   Preventative health care 03/20/2015   Sees Dr Collene Mares of gastroenterology    Past Surgical History:  Procedure Laterality Date   KNEE ARTHROSCOPY  2009   rt knee   KNEE ARTHROSCOPY  01/16/2012   Procedure: ARTHROSCOPY KNEE;  Surgeon: Gearlean Alf, MD;  Location: [facility];  Service: Orthopedics;  Laterality: Left;  WITH DEBRIDEMENT   VENTRAL HERNIA REPAIR      Family History  Problem Relation Age of Onset   Hypertension Mother    Diabetes Mother        labile   Kidney disease Mother    Peripheral vascular disease Mother    Diabetes Maternal Aunt    Heart disease Maternal Aunt    Heart disease Father        MI?   Drug abuse Son    Heart disease Cousin    Diabetes Cousin    Alcohol abuse Cousin     Social History   Socioeconomic History   Marital status: Married    Spouse name: Tommy Farrell   Number of children: Not on file   Years of education: 12   Highest education level: Not on file  Occupational History   Occupation: Truck Geophysicist/field seismologist for [employer]  Tobacco Use   Smoking status: Never  Smokeless tobacco: Never  Vaping Use   Vaping Use: Never used  Substance and Sexual Activity   Alcohol use: No   Drug use: No   Sexual activity: Yes    Comment: lives with wife, no major dietary restrictions, works for [employer]  Other Topics Concern   Not on file  Social History Narrative   Not on file   Social Determinants of Health   Financial Resource Strain: Not on file  Food Insecurity: Not on file  Transportation Needs: Not on file  Physical Activity: Not on file  Stress: Not on file  Social Connections: Not on file  Intimate Partner Violence: Not on file    Outpatient Medications Prior to Visit  Medication Sig Dispense Refill   aspirin 81 MG tablet Take 81 mg by mouth daily.     famotidine (PEPCID) 40 MG tablet Take 1 tablet (40 mg total) by mouth at bedtime as needed for heartburn or indigestion. 90  tablet 1   fish oil-omega-3 fatty acids 1000 MG capsule Take 2 g by mouth daily.     Garlic 10 MG CAPS Take by mouth.     Multiple Vitamin (MULTIVITAMIN) tablet Take 1 tablet by mouth daily.     Red Yeast Rice 600 MG CAPS Take 1 tablet by mouth daily.     tiZANidine (ZANAFLEX) 2 MG tablet Take 1 tablet (2 mg total) by mouth 2 (two) times daily as needed for muscle spasms. 30 tablet 0   hydrochlorothiazide (HYDRODIURIL) 25 MG tablet Take 1 tablet by mouth once daily 30 tablet 0   lisinopril (ZESTRIL) 10 MG tablet Take 1 tablet by mouth once daily 90 tablet 0   No facility-administered medications prior to visit.    No Known Allergies  Review of Systems  Constitutional:  Negative for fever.  HENT:  Negative for congestion.   Eyes:  Negative for pain.  Respiratory:  Negative for shortness of breath.   Cardiovascular:  Negative for chest pain, palpitations and leg swelling.  Gastrointestinal:  Negative for abdominal pain, blood in stool, diarrhea and nausea.  Genitourinary:  Negative for dysuria and frequency.  Musculoskeletal:  Negative for back pain.       (+) left toe pain   Neurological:  Negative for headaches.      Objective:    Physical Exam Constitutional:      Appearance: Normal appearance. He is not ill-appearing.  HENT:     Head: Normocephalic and atraumatic.     Right Ear: Tympanic membrane, ear canal and external ear normal.     Left Ear: Tympanic membrane, ear canal and external ear normal.  Eyes:     Conjunctiva/sclera: Conjunctivae normal.     Comments: No nystagmus  Cardiovascular:     Rate and Rhythm: Normal rate and regular rhythm.     Heart sounds: Normal heart sounds. No murmur heard. Pulmonary:     Breath sounds: Normal breath sounds. No wheezing.  Abdominal:     General: Bowel sounds are normal. There is no distension.     Palpations: Abdomen is soft.     Tenderness: There is no abdominal tenderness.     Hernia: A hernia is present. Hernia is present  in the umbilical area.     Comments: 2cm non-tender umbilical hernia felt only when he coughs   Musculoskeletal:     Cervical back: Neck supple.     Comments: 5/5 strength in upper and lower extremities   Lymphadenopathy:  Cervical: No cervical adenopathy.  Skin:    General: Skin is warm and dry.  Neurological:     Mental Status: He is alert and oriented to person, place, and time.     Deep Tendon Reflexes:     Reflex Scores:      Patellar reflexes are 0 on the right side and 1+ on the left side. Psychiatric:        Behavior: Behavior normal.    BP 122/76   Pulse 71   Temp 98 F (36.7 C)   Resp 16   Ht 5\' 6"  (1.676 m)   Wt 166 lb 6.4 oz (75.5 kg)   SpO2 96%   BMI 26.86 kg/m  Wt Readings from Last 3 Encounters:  09/11/21 166 lb 6.4 oz (75.5 kg)  02/20/21 167 lb 6.4 oz (75.9 kg)  08/22/20 165 lb 12.8 oz (75.2 kg)    Diabetic Foot Exam - Simple   No data filed    Lab Results  Component Value Date   WBC 4.5 06/16/2021   HGB 14.8 06/16/2021   HCT 44.4 06/16/2021   PLT 184.0 06/16/2021   GLUCOSE 131 (H) 06/16/2021   CHOL 183 06/16/2021   TRIG 119.0 06/16/2021   HDL 52.80 06/16/2021   LDLCALC 106 (H) 06/16/2021   ALT 29 06/16/2021   AST 18 06/16/2021   NA 141 06/16/2021   K 3.7 06/16/2021   CL 101 06/16/2021   CREATININE 1.11 06/16/2021   BUN 19 06/16/2021   CO2 32 06/16/2021   TSH 1.17 02/20/2021   PSA 4.17 (H) 08/17/2019   HGBA1C 6.0 06/16/2021    Lab Results  Component Value Date   TSH 1.17 02/20/2021   Lab Results  Component Value Date   WBC 4.5 06/16/2021   HGB 14.8 06/16/2021   HCT 44.4 06/16/2021   MCV 88.8 06/16/2021   PLT 184.0 06/16/2021   Lab Results  Component Value Date   NA 141 06/16/2021   K 3.7 06/16/2021   CO2 32 06/16/2021   GLUCOSE 131 (H) 06/16/2021   BUN 19 06/16/2021   CREATININE 1.11 06/16/2021   BILITOT 0.5 06/16/2021   ALKPHOS 53 06/16/2021   AST 18 06/16/2021   ALT 29 06/16/2021   PROT 6.1 06/16/2021    ALBUMIN 3.8 06/16/2021   CALCIUM 9.1 06/16/2021   ANIONGAP 8 08/17/2017   GFR 71.39 06/16/2021   Lab Results  Component Value Date   CHOL 183 06/16/2021   Lab Results  Component Value Date   HDL 52.80 06/16/2021   Lab Results  Component Value Date   LDLCALC 106 (H) 06/16/2021   Lab Results  Component Value Date   TRIG 119.0 06/16/2021   Lab Results  Component Value Date   CHOLHDL 3 06/16/2021   Lab Results  Component Value Date   HGBA1C 6.0 06/16/2021       Colonoscopy: Last completed on 08/25/2021. No polyps found. Internal hemorrhoids were found. Repeat in 10 years.  Assessment & Plan:   Problem List Items Addressed This Visit     Hyperlipemia    Encourage heart healthy diet such as MIND or DASH diet, increase exercise, avoid trans fats, simple carbohydrates and processed foods, consider a krill or fish or flaxseed oil cap daily.       Relevant Medications   hydrochlorothiazide (HYDRODIURIL) 25 MG tablet   lisinopril (ZESTRIL) 10 MG tablet   Hypertension    Well controlled, no changes to meds. Encouraged heart healthy diet such as  the DASH diet and exercise as tolerated.       Relevant Medications   hydrochlorothiazide (HYDRODIURIL) 25 MG tablet   lisinopril (ZESTRIL) 10 MG tablet   Hyperglycemia - Primary    hgba1c acceptable, minimize simple carbs. Increase exercise as tolerated.       Preventative health care    Patient encouraged to maintain heart healthy diet, regular exercise, adequate sleep. Consider daily probiotics. Take medications as prescribed. Labs reviewed       Elevated PSA    Follows with urology and stable, asymptomatic      Left foot pain    Pain at base of great toe, no injury, no redness or lesions. Change in shoes has helped some. Try inserts, ice and topical rubs consider referral to podiatry.       Umbilical hernia    Very small and asymptomatic      Other Visit Diagnoses     Essential hypertension       Relevant  Medications   hydrochlorothiazide (HYDRODIURIL) 25 MG tablet   lisinopril (ZESTRIL) 10 MG tablet       Meds ordered this encounter  Medications   hydrochlorothiazide (HYDRODIURIL) 25 MG tablet    Sig: Take 1 tablet (25 mg total) by mouth daily.    Dispense:  90 tablet    Refill:  1   lisinopril (ZESTRIL) 10 MG tablet    Sig: Take 1 tablet (10 mg total) by mouth daily.    Dispense:  90 tablet    Refill:  1     I,Tommy Farrell,acting as a scribe for Penni Homans, MD.,have documented all relevant documentation on the behalf of Penni Homans, MD,as directed by  Penni Homans, MD while in the presence of Penni Homans, MD.   I, Tommy Lukes, MD., personally preformed the services described in this documentation.  All medical record entries made by the scribe were at my direction and in my presence.  I have reviewed the chart and discharge instructions (if applicable) and agree that the record reflects my personal performance and is accurate and complete. 09/11/2021

## 2021-09-11 NOTE — Patient Instructions (Addendum)
Ice, voltaren or lidocaine gels to feet can consider podiatry consultation   Paxlovid and Molnupiravir the new COVID medication we can give you if you get COVID so make sure you test if you have symptoms because we have to treat by day 5 of symptoms for it to be effective. If you are positive let us know so we can treat. If a home test is negative and your symptoms are persistent get a PCR test. Can check testing locations at [email or web address] If you are positive we will make an appointment with [place] and we will send in Paxlovid or Molnupiravir if you would like it. Check with your pharmacy before we meet to confirm they have it in stock, if they do not referral to podiatrythen we can get the prescription at the [facility]    Preventive Care 92-14 Years Old, Male Preventive care refers to lifestyle choices and visits with your health care provider that can promote health and wellness. This includes: A yearly physical exam. This is also called an annual wellness visit. Regular dental and eye exams. Immunizations. Screening for certain conditions. Healthy lifestyle choices, such as: Eating a healthy diet. Getting regular exercise. Not using drugs or products that contain nicotine and tobacco. Limiting alcohol use. What can I expect for my preventive care visit? Physical exam Your health care provider will check your: Height and weight. These may be used to calculate your BMI (body mass index). BMI is a measurement that tells if you are at a healthy weight. Heart rate and blood pressure. Body temperature. Skin for abnormal spots. Counseling Your health care provider may ask you questions about your: Past medical problems. Family's medical history. Alcohol, tobacco, and drug use. Emotional well-being. Home life and relationship well-being. Sexual activity. Diet, exercise, and sleep habits. Work and work Statistician. Access to firearms. What immunizations do I  need? Vaccines are usually given at various ages, according to a schedule. Your health care provider will recommend vaccines for you based on your age, medical history, and lifestyle or other factors, such as travel or where you work. What tests do I need? Blood tests Lipid and cholesterol levels. These may be checked every 5 years, or more often if you are over 68 years old. Hepatitis C test. Hepatitis B test. Screening Lung cancer screening. You may have this screening every year starting at age 51 if you have a 30-pack-year history of smoking and currently smoke or have quit within the past 15 years. Prostate cancer screening. Recommendations will vary depending on your family history and other risks. Genital exam to check for testicular cancer or hernias. Colorectal cancer screening. All adults should have this screening starting at age 88 and continuing until age 1. Your health care provider may recommend screening at age 44 if you are at increased risk. You will have tests every 1-10 years, depending on your results and the type of screening test. Diabetes screening. This is done by checking your blood sugar (glucose) after you have not eaten for a while (fasting). You may have this done every 1-3 years. STD (sexually transmitted disease) testing, if you are at risk. Follow these instructions at home: Eating and drinking  Eat a diet that includes fresh fruits and vegetables, whole grains, lean protein, and low-fat dairy products. Take vitamin and mineral supplements as recommended by your health care provider. Do not drink alcohol if your health care provider tells you not to drink. If you drink alcohol: Limit how much you  have to 0-2 drinks a day. Be aware of how much alcohol is in your drink. In the [place] one drink equals one 12 oz bottle of beer (355 mL), one 5 oz glass of wine (148 mL), or one 1 oz glass of hard liquor (44 mL). Lifestyle Take daily care of your teeth and  gums. Brush your teeth every morning and night with fluoride toothpaste. Floss one time each day. Stay active. Exercise for at least 30 minutes 5 or more days each week. Do not use any products that contain nicotine or tobacco, such as cigarettes, e-cigarettes, and chewing tobacco. If you need help quitting, ask your health care provider. Do not use drugs. If you are sexually active, practice safe sex. Use a condom or other form of protection to prevent STIs (sexually transmitted infections). If told by your health care provider, take low-dose aspirin daily starting at age 76. Find healthy ways to cope with stress, such as: Meditation, yoga, or listening to music. Journaling. Talking to a trusted person. Spending time with friends and family. Safety Always wear your seat belt while driving or riding in a vehicle. Do not drive: If you have been drinking alcohol. Do not ride with someone who has been drinking. When you are tired or distracted. While texting. Wear a helmet and other protective equipment during sports activities. If you have firearms in your house, make sure you follow all gun safety procedures. What's next? Go to your health care provider once a year for an annual wellness visit. Ask your health care provider how often you should have your eyes and teeth checked. Stay up to date on all vaccines. This information is not intended to replace advice given to you by your health care provider. Make sure you discuss any questions you have with your health care provider. Document Revised: 01/20/2021 Document Reviewed: 11/06/2018 Elsevier Patient Education  2022 [employer].

## 2021-09-11 NOTE — Assessment & Plan Note (Signed)
Very small and asymptomatic

## 2021-10-09 ENCOUNTER — Other Ambulatory Visit (HOSPITAL_BASED_OUTPATIENT_CLINIC_OR_DEPARTMENT_OTHER): Payer: Self-pay

## 2021-10-09 MED ORDER — PFIZER COVID-19 VAC BIVALENT 30 MCG/0.3ML IM SUSP
INTRAMUSCULAR | 0 refills | Status: DC
  Filled 2021-10-09: qty 0.3, 1d supply, fill #0

## 2021-11-10 ENCOUNTER — Ambulatory Visit: Payer: Managed Care, Other (non HMO) | Admitting: Family

## 2022-01-01 ENCOUNTER — Encounter: Payer: Self-pay | Admitting: Family Medicine

## 2022-01-01 ENCOUNTER — Ambulatory Visit (INDEPENDENT_AMBULATORY_CARE_PROVIDER_SITE_OTHER): Payer: Managed Care, Other (non HMO) | Admitting: Family Medicine

## 2022-01-01 VITALS — BP 126/71 | HR 63 | Ht 66.0 in | Wt 169.6 lb

## 2022-01-01 DIAGNOSIS — M79672 Pain in left foot: Secondary | ICD-10-CM

## 2022-01-01 NOTE — Patient Instructions (Addendum)
Conservative measure: Aleve morning and night Frozen water bottle Home exercises Good support shoes at all times  If not improving in the next 4 weeks let me know and we can send you to podiatry or sports medicine

## 2022-01-01 NOTE — Progress Notes (Signed)
Acute Office Visit  Subjective:    [name], male    DOB: March 06, 1961, 61 y.o.   MRN: [id number]  CC: left foot pain    HPI Patient is in today for left foot pain.  Patient reports that on Saturday he was up on his feet for over 3 hours helping a friend move. By the end of the day the plantar surface of his left foot was hurting 9/10 to walk/stand. He tried to rest the remainder of the weekend and do epsom salt soaks which seemed to help some. He now has no pain at rest, but plantar aspect of foot can throb up to 6/10 with walking or prolonged standing. He has not noticed any swelling or bruising. Denies any trauma. States he thinks his shoes are worn out which is not helping - he plans to get some new shoes and possibly inserts soon.      Past Medical History:  Diagnosis Date   Allergy    Constipation 12/25/2015   Dysrhythmia    sinus arrythmia-2006-work up negative findings   Hyperglycemia 03/15/2015   Hyperlipemia    Hypertension    Leg pain, diffuse, right 04/15/2017   Preventative health care 03/20/2015   Sees Dr Collene Mares of gastroenterology    Past Surgical History:  Procedure Laterality Date   KNEE ARTHROSCOPY  2009   rt knee   KNEE ARTHROSCOPY  01/16/2012   Procedure: ARTHROSCOPY KNEE;  Surgeon: Gearlean Alf, MD;  Location: [facility];  Service: Orthopedics;  Laterality: Left;  WITH DEBRIDEMENT   VENTRAL HERNIA REPAIR      Family History  Problem Relation Age of Onset   Hypertension Mother    Diabetes Mother        labile   Kidney disease Mother    Peripheral vascular disease Mother    Diabetes Maternal Aunt    Heart disease Maternal Aunt    Heart disease Father        MI?   Drug abuse Son    Heart disease Cousin    Diabetes Cousin    Alcohol abuse Cousin     Social History   Socioeconomic History   Marital status: Married    Spouse name: Delora   Number of children: Not on file   Years of education: 12   Highest  education level: Not on file  Occupational History   Occupation: Administrator for [employer]  Tobacco Use   Smoking status: Never   Smokeless tobacco: Never  Vaping Use   Vaping Use: Never used  Substance and Sexual Activity   Alcohol use: No   Drug use: No   Sexual activity: Yes    Comment: lives with wife, no major dietary restrictions, works for [employer]  Other Topics Concern   Not on file  Social History Narrative   Not on file   Social Determinants of Health   Financial Resource Strain: Not on file  Food Insecurity: Not on file  Transportation Needs: Not on file  Physical Activity: Not on file  Stress: Not on file  Social Connections: Not on file  Intimate Partner Violence: Not on file    Outpatient Medications Prior to Visit  Medication Sig Dispense Refill   aspirin 81 MG tablet Take 81 mg by mouth daily.     COVID-19 mRNA bivalent vaccine, Pfizer, (PFIZER COVID-19 VAC BIVALENT) injection Inject into the muscle. 0.3 mL 0   famotidine (PEPCID) 40 MG tablet Take 1  tablet (40 mg total) by mouth at bedtime as needed for heartburn or indigestion. 90 tablet 1   fish oil-omega-3 fatty acids 1000 MG capsule Take 2 g by mouth daily.     Garlic 10 MG CAPS Take by mouth.     hydrochlorothiazide (HYDRODIURIL) 25 MG tablet Take 1 tablet (25 mg total) by mouth daily. 90 tablet 1   lisinopril (ZESTRIL) 10 MG tablet Take 1 tablet (10 mg total) by mouth daily. 90 tablet 1   Multiple Vitamin (MULTIVITAMIN) tablet Take 1 tablet by mouth daily.     Red Yeast Rice 600 MG CAPS Take 1 tablet by mouth daily.     tiZANidine (ZANAFLEX) 2 MG tablet Take 1 tablet (2 mg total) by mouth 2 (two) times daily as needed for muscle spasms. 30 tablet 0   No facility-administered medications prior to visit.    No Known Allergies  Review of Systems All review of systems negative except what is listed in the HPI     Objective:    Physical Exam Vitals reviewed.  Constitutional:      Appearance: Normal  appearance.  Musculoskeletal:        General: No swelling. Normal range of motion.     Right lower leg: No edema.     Left lower leg: No edema.     Comments: Tender to palpation to plantar surface, midfoot, no swelling, nodules, skin changes, etc.   Skin:    General: Skin is warm and dry.     Capillary Refill: Capillary refill takes less than 2 seconds.  Neurological:     General: No focal deficit present.     Mental Status: He is alert and oriented to person, place, and time. Mental status is at baseline.  Psychiatric:        Mood and Affect: Mood normal.        Behavior: Behavior normal.        Thought Content: Thought content normal.        Judgment: Judgment normal.    There were no vitals taken for this visit. Wt Readings from Last 3 Encounters:  09/11/21 166 lb 6.4 oz (75.5 kg)  02/20/21 167 lb 6.4 oz (75.9 kg)  08/22/20 165 lb 12.8 oz (75.2 kg)    Health Maintenance Due  Topic Date Due   INFLUENZA VACCINE  Never done    There are no preventive care reminders to display for this patient.   Lab Results  Component Value Date   TSH 1.17 02/20/2021   Lab Results  Component Value Date   WBC 4.5 06/16/2021   HGB 14.8 06/16/2021   HCT 44.4 06/16/2021   MCV 88.8 06/16/2021   PLT 184.0 06/16/2021   Lab Results  Component Value Date   NA 141 06/16/2021   K 3.7 06/16/2021   CO2 32 06/16/2021   GLUCOSE 131 (H) 06/16/2021   BUN 19 06/16/2021   CREATININE 1.11 06/16/2021   BILITOT 0.5 06/16/2021   ALKPHOS 53 06/16/2021   AST 18 06/16/2021   ALT 29 06/16/2021   PROT 6.1 06/16/2021   ALBUMIN 3.8 06/16/2021   CALCIUM 9.1 06/16/2021   ANIONGAP 8 08/17/2017   GFR 71.39 06/16/2021   Lab Results  Component Value Date   CHOL 183 06/16/2021   Lab Results  Component Value Date   HDL 52.80 06/16/2021   Lab Results  Component Value Date   LDLCALC 106 (H) 06/16/2021   Lab Results  Component Value Date  TRIG 119.0 06/16/2021   Lab Results  Component Value  Date   CHOLHDL 3 06/16/2021   Lab Results  Component Value Date   HGBA1C 6.0 06/16/2021       Assessment & Plan:   1. Left foot pain Consider plantar fasciitis or other tendinitis. Recommend starting with conservative measures: Aleve morning and night - states he already has some at home Frozen water bottle to ice/stretch Home exercises Good support shoes at all times  If not improving in the next 4 weeks let me know and we can send you to podiatry or sports medicine    Follow-up if symptoms worsen or fail to improve.   Purcell Nails Olevia Bowens, DNP, FNP-C

## 2022-01-24 ENCOUNTER — Other Ambulatory Visit: Payer: Self-pay

## 2022-01-24 ENCOUNTER — Emergency Department (HOSPITAL_BASED_OUTPATIENT_CLINIC_OR_DEPARTMENT_OTHER)
Admission: EM | Admit: 2022-01-24 | Discharge: 2022-01-24 | Disposition: A | Discharge status: Home / Self Care | Payer: Managed Care, Other (non HMO) | Attending: Emergency Medicine | Admitting: Emergency Medicine

## 2022-01-24 ENCOUNTER — Encounter (HOSPITAL_BASED_OUTPATIENT_CLINIC_OR_DEPARTMENT_OTHER): Payer: Self-pay | Admitting: Emergency Medicine

## 2022-01-24 DIAGNOSIS — I10 Essential (primary) hypertension: Secondary | ICD-10-CM | POA: Diagnosis not present

## 2022-01-24 DIAGNOSIS — R1033 Periumbilical pain: Secondary | ICD-10-CM | POA: Diagnosis not present

## 2022-01-24 DIAGNOSIS — R1031 Right lower quadrant pain: Secondary | ICD-10-CM | POA: Insufficient documentation

## 2022-01-24 DIAGNOSIS — Z20822 Contact with and (suspected) exposure to covid-19: Secondary | ICD-10-CM | POA: Diagnosis not present

## 2022-01-24 DIAGNOSIS — R7989 Other specified abnormal findings of blood chemistry: Secondary | ICD-10-CM | POA: Insufficient documentation

## 2022-01-24 DIAGNOSIS — R197 Diarrhea, unspecified: Secondary | ICD-10-CM

## 2022-01-24 DIAGNOSIS — Z79899 Other long term (current) drug therapy: Secondary | ICD-10-CM | POA: Diagnosis not present

## 2022-01-24 DIAGNOSIS — L905 Scar conditions and fibrosis of skin: Secondary | ICD-10-CM | POA: Diagnosis not present

## 2022-01-24 DIAGNOSIS — R195 Other fecal abnormalities: Secondary | ICD-10-CM | POA: Insufficient documentation

## 2022-01-24 DIAGNOSIS — R1032 Left lower quadrant pain: Secondary | ICD-10-CM | POA: Diagnosis not present

## 2022-01-24 LAB — COMPREHENSIVE METABOLIC PANEL
ALT: 26 U/L (ref 0–44)
AST: 22 U/L (ref 15–41)
Albumin: 3.6 g/dL (ref 3.5–5.0)
Alkaline Phosphatase: 37 U/L — ABNORMAL LOW (ref 38–126)
Anion gap: 8 (ref 5–15)
BUN: 37 mg/dL — ABNORMAL HIGH (ref 8–23)
CO2: 28 mmol/L (ref 22–32)
Calcium: 9.4 mg/dL (ref 8.9–10.3)
Chloride: 102 mmol/L (ref 98–111)
Creatinine, Ser: 1.1 mg/dL (ref 0.61–1.24)
GFR, Estimated: >60 mL/min (ref >60)
Glucose, Bld: 97 mg/dL (ref 70–99)
Potassium: 4.1 mmol/L (ref 3.5–5.1)
Sodium: 138 mmol/L (ref 135–145)
Total Bilirubin: 0.8 mg/dL (ref 0.3–1.2)
Total Protein: 6.7 g/dL (ref 6.5–8.1)

## 2022-01-24 LAB — CBC WITH DIFFERENTIAL/PLATELET
Abs Immature Granulocytes: 0.01 10*3/uL (ref 0.00–0.07)
Basophils Absolute: 0 10*3/uL (ref 0.0–0.1)
Basophils Relative: 1 %
Eosinophils Absolute: 0.1 10*3/uL (ref 0.0–0.5)
Eosinophils Relative: 2 %
HCT: 40.6 % (ref 39.0–52.0)
Hemoglobin: 13.8 g/dL (ref 13.0–17.0)
Immature Granulocytes: 0 %
Lymphocytes Relative: 34 %
Lymphs Abs: 2.4 10*3/uL (ref 0.7–4.0)
MCH: 29.7 pg (ref 26.0–34.0)
MCHC: 34 g/dL (ref 30.0–36.0)
MCV: 87.3 fL (ref 80.0–100.0)
Monocytes Absolute: 0.6 10*3/uL (ref 0.1–1.0)
Monocytes Relative: 8 %
Neutro Abs: 4 10*3/uL (ref 1.7–7.7)
Neutrophils Relative %: 55 %
Platelets: 195 10*3/uL (ref 150–400)
RBC: 4.65 MIL/uL (ref 4.22–5.81)
RDW: 14.1 % (ref 11.5–15.5)
WBC: 7.2 10*3/uL (ref 4.0–10.5)
nRBC: 0 % (ref 0.0–0.2)

## 2022-01-24 LAB — OCCULT BLOOD X 1 CARD TO LAB, STOOL: Fecal Occult Bld: POSITIVE — AB

## 2022-01-24 LAB — RESP PANEL BY RT-PCR (FLU A&B, COVID) ARPGX2
Influenza A by PCR: NEGATIVE
Influenza B by PCR: NEGATIVE
SARS Coronavirus 2 by RT PCR: NEGATIVE

## 2022-01-24 LAB — LIPASE, BLOOD: Lipase: 25 U/L (ref 11–51)

## 2022-01-24 MED ORDER — SODIUM CHLORIDE 0.9 % IV SOLN
INTRAVENOUS | Status: DC | PRN
Start: 2022-01-24 — End: 2022-01-24

## 2022-01-24 MED ORDER — ONDANSETRON HCL 4 MG/2ML IJ SOLN
4.0000 mg | Freq: Once | INTRAMUSCULAR | Status: AC
  Administered 2022-01-24: 4 mg via INTRAVENOUS
  Filled 2022-01-24: qty 2

## 2022-01-24 MED ORDER — OMEPRAZOLE 20 MG PO CPDR: 20.0000 mg | DELAYED_RELEASE_CAPSULE | Freq: Every day | ORAL | 0 refills | Status: DC

## 2022-01-24 MED ORDER — FAMOTIDINE IN NACL 20-0.9 MG/50ML-% IV SOLN
20.0000 mg | Freq: Once | INTRAVENOUS | Status: AC
  Administered 2022-01-24: 20 mg via INTRAVENOUS
  Filled 2022-01-24: qty 50

## 2022-01-24 MED ORDER — SODIUM CHLORIDE 0.9 % IV BOLUS
1000.0000 mL | Freq: Once | INTRAVENOUS | Status: AC
  Administered 2022-01-24: 1000 mL via INTRAVENOUS

## 2022-01-24 NOTE — Discharge Instructions (Addendum)
Take Prilosec 30 minutes before your first meal of the day.  Continue taking your other home medicine, stop taking aspirin or any anti-inflammatory medicine which includes BC powder, Goody powder, Motrin, Aleve.  Avoid any alcohol.  He will need to schedule follow-up with Dr. Collene Mares, let her know you are seen in the ED and there is concern about possible bleeding.  She will likely need to schedule a colonoscopy.  In the meantime if you start feeling dizzy and lightheaded, having worsening blood loss, feeling faint those are reasons to return back to the ED for additional evaluation. ?

## 2022-01-24 NOTE — ED Provider Notes (Signed)
[facility] EMERGENCY DEPARTMENT Provider Note   CSN: [id number] Arrival date & time: 01/24/22  1126     History  Chief Complaint  Patient presents with   Diarrhea    Tommy Farrell is a 61 y.o. male.   Diarrhea  Patient with medical history notable for hypertension, hyperlipidemia, hyperglycemia but not documented diabetes, presents due to abdominal pain, diarrhea, vomiting and black stool.  Patient states the diarrhea started last night after eating in a restaurant in [place].  He had multiple episodes of diarrhea, denies any vomiting but did feel nauseated.  Had lower abdominal cramping which has been constant and does not radiate elsewhere.  He reports this morning he woke up feeling lightheaded, use the bathroom and had dark appearing stool.  No Pepto or iron supplements.  He is not on any anticoagulation, takes [facility] powder occasionally for arthritis.  Denies history of GI bleeds, previous colonoscopy per patient was normal.  Status post ventral hernia repair, denies any other abdominal procedures.  Home Medications Prior to Admission medications   Medication Sig Start Date End Date Taking? Authorizing Provider  omeprazole (PRILOSEC) 20 MG capsule Take 1 capsule (20 mg total) by mouth daily. 01/24/22  Yes Sherrill Raring, PA-C  aspirin 81 MG tablet Take 81 mg by mouth daily.    [provider]  COVID-19 mRNA bivalent vaccine, Pfizer, (PFIZER COVID-19 VAC BIVALENT) injection Inject into the muscle. 09/11/21   Carlyle Basques, MD  famotidine (PEPCID) 40 MG tablet Take 1 tablet (40 mg total) by mouth at bedtime as needed for heartburn or indigestion. 02/15/20   Mosie Lukes, MD  fish oil-omega-3 fatty acids 1000 MG capsule Take 2 g by mouth daily.    [provider]  Garlic 10 MG CAPS Take by mouth.    [provider]  hydrochlorothiazide (HYDRODIURIL) 25 MG tablet Take 1 tablet (25 mg total) by mouth daily. 09/11/21   Mosie Lukes, MD   lisinopril (ZESTRIL) 10 MG tablet Take 1 tablet (10 mg total) by mouth daily. 09/11/21   Mosie Lukes, MD  Multiple Vitamin (MULTIVITAMIN) tablet Take 1 tablet by mouth daily.    [provider]  Red Yeast Rice 600 MG CAPS Take 1 tablet by mouth daily.    [provider]  tiZANidine (ZANAFLEX) 2 MG tablet Take 1 tablet (2 mg total) by mouth 2 (two) times daily as needed for muscle spasms. 07/07/21   Mosie Lukes, MD      Allergies    Patient has no known allergies.    Review of Systems   Review of Systems  Gastrointestinal:  Positive for diarrhea.   Physical Exam Updated Vital Signs BP 117/82    Pulse 66    Temp 98.1 F (36.7 C) (Oral)    Resp 16    SpO2 100%  Physical Exam Vitals and nursing note reviewed. Exam conducted with a chaperone present.  Constitutional:      Appearance: Normal appearance.  HENT:     Head: Normocephalic and atraumatic.     Mouth/Throat:     Mouth: Mucous membranes are moist.  Eyes:     General: No scleral icterus.       Right eye: No discharge.        Left eye: No discharge.     Extraocular Movements: Extraocular movements intact.     Pupils: Pupils are equal, round, and reactive to light.  Cardiovascular:     Rate and Rhythm: Normal  rate and regular rhythm.     Pulses: Normal pulses.     Heart sounds: Normal heart sounds. No murmur heard.   No friction rub. No gallop.  Pulmonary:     Effort: Pulmonary effort is normal. No respiratory distress.     Breath sounds: Normal breath sounds.  Abdominal:     General: Abdomen is flat. A surgical scar is present. Bowel sounds are normal. There is no distension.     Palpations: Abdomen is soft.     Tenderness: There is abdominal tenderness in the right lower quadrant, periumbilical area, suprapubic area and left lower quadrant. There is no guarding.     Hernia: A hernia is present. Hernia is present in the umbilical area.     Comments: Diffuse lower abdominal tenderness without any  rigidity or guarding.  Umbilical hernia soft and reducible   Genitourinary:    Comments: Rectal exam performed with chaperone in room.  No fissures, no fluctuance.  No actively bleeding hemorrhoids noted. Skin:    General: Skin is warm and dry.     Coloration: Skin is not jaundiced.  Neurological:     Mental Status: He is alert. Mental status is at baseline.     Coordination: Coordination normal.    ED Results / Procedures / Treatments   Labs (all labs ordered are listed, but only abnormal results are displayed) Labs Reviewed  COMPREHENSIVE METABOLIC PANEL - Abnormal; Notable for the following components:      Result Value   BUN 37 (*)    Alkaline Phosphatase 37 (*)    All other components within normal limits  OCCULT BLOOD X 1 CARD TO LAB, STOOL - Abnormal; Notable for the following components:   Fecal Occult Bld POSITIVE (*)    All other components within normal limits  RESP PANEL BY RT-PCR (FLU A&B, COVID) ARPGX2  CBC WITH DIFFERENTIAL/PLATELET  LIPASE, BLOOD  URINALYSIS, ROUTINE W REFLEX MICROSCOPIC    EKG None  Radiology No results found.  Procedures Procedures    Medications Ordered in ED Medications  0.9 %  sodium chloride infusion (0 mLs Intravenous Stopped 01/24/22 1320)  sodium chloride 0.9 % bolus 1,000 mL (0 mLs Intravenous Stopped 01/24/22 1320)  ondansetron (ZOFRAN) injection 4 mg (4 mg Intravenous Given 01/24/22 1208)  famotidine (PEPCID) IVPB 20 mg premix (0 mg Intravenous Stopped 01/24/22 1320)    ED Course/ Medical Decision Making/ A&P                           Medical Decision Making Amount and/or Complexity of Data Reviewed Labs: ordered.  Risk Prescription drug management.   This patient presents to the ED for concern of AP/D/N/Dark Stool, this involves an extensive number of treatment options, and is a complaint that carries with it a high risk of complications and morbidity.  The differential diagnosis includes but not limited to GI bleed,  dehydration, gastroenteritis, ischemic colitis   Additional history obtained:   I reviewed outpatient records, noted patient does not have any history of GI bleeds.  He is followed by primary and his previous colonoscopy was done by Dr. Collene Mares.    Lab Tests:  I ordered, viewed, and personally interpreted labs.  The pertinent results include: Patient is fecal occult positive.  CBC does not show any underlying anemia, there is no leukocytosis.  CMP shows no gross electrolyte derangement, no AKI.  Patient is COVID and flu negative.  Lipase negative.  ECG/Cardiac monitoring:    The patient was maintained on a cardiac monitor.  Visualized monitor strip which showed NSR HR 66 per my interpretation.    Medicines ordered and prescription drug management:  I ordered medication including: Fluids, Zofran, Pepcid  I have reviewed the patients home medicines and have made adjustments as needed   Test Considered:  Could consider additional imaging, however given patient does not have any focal tenderness do not think this would be particularly illuminating.  No history of coagulopathy, no painless bleeding.  Does not appear consistent with mesenteric ischemia.  We discussed imaging and patient would prefer to follow-up with his GI doctor outpatient.  I think that is reasonable.   Reevaluation:  After the interventions noted above, I reevaluated the patient and found has improved   Problems addressed / ED Course: 61 year old male presenting due to diarrhea and rectal bleeding.  Work-up is as documented above, there is no peritoneal signs.  Fecal occult was positive, his BUN is slightly elevated which could be consistent with a GI bleed.  However, patient is not anemic.  Not on any anti-inflammatories, anticoagulation and does not drink alcohol.  Considered observation for admission and colonoscopy but given patient is not showing signs of hemorrhagic shock, acute/clinically significant blood  loss do not feel this is needed at this time.  We did discuss very strict return precautions, patient verbalized understanding.  He has a GI doctor Dr. Collene Mares who did his previous colonoscopy, he will reach out to her today and schedule follow-up.   Disposition:   After consideration of the diagnostic results and the patients response to treatment, I feel that the patent would benefit from GI follow-up, closer to return precautions, will start on Prilosec.            Final Clinical Impression(s) / ED Diagnoses Final diagnoses:  Fecal occult blood test positive  Diarrhea, unspecified type    Rx / DC Orders ED Discharge Orders          Ordered    omeprazole (PRILOSEC) 20 MG capsule  Daily        01/24/22 1340              Sherrill Raring, PA-C 01/24/22 1721    Charlesetta Shanks, MD 01/25/22 [phone]

## 2022-01-24 NOTE — ED Triage Notes (Signed)
Pt reports diarrhea yesterday and this morning accompanied by nausea and generalized abd pin. Pt says it feels like food poisoning. Also noticed stool was dark this am. Has not taken any pepto bismol. Denies lightheadedness. ?

## 2022-02-22 ENCOUNTER — Ambulatory Visit (INDEPENDENT_AMBULATORY_CARE_PROVIDER_SITE_OTHER): Payer: Managed Care, Other (non HMO) | Admitting: Family Medicine

## 2022-02-22 ENCOUNTER — Encounter: Payer: Self-pay | Admitting: Family Medicine

## 2022-02-22 VITALS — BP 124/88 | HR 74 | Temp 98.0°F | Resp 18 | Ht 66.0 in | Wt 165.6 lb

## 2022-02-22 DIAGNOSIS — R197 Diarrhea, unspecified: Secondary | ICD-10-CM | POA: Diagnosis not present

## 2022-02-22 LAB — COMPREHENSIVE METABOLIC PANEL
ALT: 37 U/L (ref 0–53)
AST: 23 U/L (ref 0–37)
Albumin: 4.3 g/dL (ref 3.5–5.2)
Alkaline Phosphatase: 63 U/L (ref 39–117)
BUN: 19 mg/dL (ref 6–23)
CO2: 31 mEq/L (ref 19–32)
Calcium: 8.8 mg/dL (ref 8.4–10.5)
Chloride: 100 mEq/L (ref 96–112)
Creatinine, Ser: 1.15 mg/dL (ref 0.40–1.50)
GFR: 68.09 mL/min (ref >60.00)
Glucose, Bld: 86 mg/dL (ref 70–99)
Potassium: 4.1 mEq/L (ref 3.5–5.1)
Sodium: 139 mEq/L (ref 135–145)
Total Bilirubin: 0.4 mg/dL (ref 0.2–1.2)
Total Protein: 6.9 g/dL (ref 6.0–8.3)

## 2022-02-22 LAB — POC HEMOCCULT BLD/STL (OFFICE/1-CARD/DIAGNOSTIC)
Card #1 Date: 3302023
Fecal Occult Blood, POC: NEGATIVE

## 2022-02-22 LAB — CBC WITH DIFFERENTIAL/PLATELET
Basophils Absolute: 0 10*3/uL (ref 0.0–0.1)
Basophils Relative: 0.4 % (ref 0.0–3.0)
Eosinophils Absolute: 0.1 10*3/uL (ref 0.0–0.7)
Eosinophils Relative: 3.2 % (ref 0.0–5.0)
HCT: 43.1 % (ref 39.0–52.0)
Hemoglobin: 14.3 g/dL (ref 13.0–17.0)
Lymphocytes Relative: 34.4 % (ref 12.0–46.0)
Lymphs Abs: 1 10*3/uL (ref 0.7–4.0)
MCHC: 33.2 g/dL (ref 30.0–36.0)
MCV: 89.3 fl (ref 78.0–100.0)
Monocytes Absolute: 0.4 10*3/uL (ref 0.1–1.0)
Monocytes Relative: 13.8 % — ABNORMAL HIGH (ref 3.0–12.0)
Neutro Abs: 1.4 10*3/uL (ref 1.4–7.7)
Neutrophils Relative %: 48.2 % (ref 43.0–77.0)
Platelets: 196 10*3/uL (ref 150.0–400.0)
RBC: 4.83 Mil/uL (ref 4.22–5.81)
RDW: 15 % (ref 11.5–15.5)
WBC: 2.9 10*3/uL — ABNORMAL LOW (ref 4.0–10.5)

## 2022-02-22 LAB — TSH: TSH: 2.51 u[IU]/mL (ref 0.35–5.50)

## 2022-02-22 NOTE — Progress Notes (Signed)
? ?Subjective:  ? ?By signing my name below, I, Tommy Farrell, attest that this documentation has been prepared under the direction and in the presence of [name], DO. 02/22/2022 ? ? ? [name], male    DOB: [birth year], 61 y.o.   MRN: [id number] ? ?Chief Complaint  ?Patient presents with  ? Diarrhea  ?  Pt states having diarrhea on Tuesday. Pt states not feeling good the day prior. Pt states no nausea or vomiting. No blood in stool.  ? ? ?Diarrhea  ?Associated symptoms include abdominal pain (mild bloating like pain) and coughing. Pertinent negatives include no fever.  ?Patient is in today for a office visit.  ? ?He complains of diarrhea since Monday. First loose stool he had he was in the bathroom for 30 minutes. He does not have frequent bowel movements but he is in the bathroom for longer periods. His last loose stool was before this visit. He has mild bloating like pain. He denies having any fevers, constipation, blood in stool at this time. His stools are not watery and the color is not dark. He has no new heartburn symptoms. No one else around him has similar symptoms. He has not taken any OTC medication to manage his symptoms.  ? ?He had an appointment with his GI specialist for blood in the stool earlier this month and found no new issues. He found blood in stool at home which prompted him to see an emergency room then see a GI specialist. He was reccommended to stop taking low dose aspirin and found his symptoms improved. ? ?He also complains of congestion, cough, sinus pressure, eye itching since Monday. He denies having any sputum production. He started taking OTC sinus medication to manage his symptoms. Most his symptoms improved or resolved and he only has congestion and cough at this time.  ? ? ?Past Medical History:  ?Diagnosis Date  ? Allergy   ? Constipation 12/25/2015  ? Dysrhythmia   ? sinus arrythmia-2006-work up negative findings  ? Hyperglycemia 03/15/2015  ?  Hyperlipemia   ? Hypertension   ? Leg pain, diffuse, right 04/15/2017  ? Preventative health care 03/20/2015  ? Sees Dr Collene Mares of gastroenterology  ? ? ?Past Surgical History:  ?Procedure Laterality Date  ? KNEE ARTHROSCOPY  2009  ? rt knee  ? KNEE ARTHROSCOPY  01/16/2012  ? Procedure: ARTHROSCOPY KNEE;  Surgeon: Gearlean Alf, MD;  Location: [facility];  Service: Orthopedics;  Laterality: Left;  WITH DEBRIDEMENT  ? VENTRAL HERNIA REPAIR    ? ? ?Family History  ?Problem Relation Age of Onset  ? Hypertension Mother   ? Diabetes Mother   ?     labile  ? Kidney disease Mother   ? Peripheral vascular disease Mother   ? Diabetes Maternal Aunt   ? Heart disease Maternal Aunt   ? Heart disease Father   ?     MI?  ? Drug abuse Son   ? Heart disease Cousin   ? Diabetes Cousin   ? Alcohol abuse Cousin   ? ? ?Social History  ? ?Socioeconomic History  ? Marital status: Married  ?  Spouse name: Delora  ? Number of children: Not on file  ? Years of education: 62  ? Highest education level: Not on file  ?Occupational History  ? Occupation: Administrator for [employer]  ?Tobacco Use  ? Smoking status: Never  ? Smokeless tobacco: Never  ?Vaping Use  ?  Vaping Use: Never used  ?Substance and Sexual Activity  ? Alcohol use: No  ? Drug use: No  ? Sexual activity: Yes  ?  Comment: lives with wife, no major dietary restrictions, works for [employer]  ?Other Topics Concern  ? Not on file  ?Social History Narrative  ? Not on file  ? ?Social Determinants of Health  ? ?Financial Resource Strain: Not on file  ?Food Insecurity: Not on file  ?Transportation Needs: Not on file  ?Physical Activity: Not on file  ?Stress: Not on file  ?Social Connections: Not on file  ?Intimate Partner Violence: Not on file  ? ? ?Outpatient Medications Prior to Visit  ?Medication Sig Dispense Refill  ? COVID-19 mRNA bivalent vaccine, Pfizer, (PFIZER COVID-19 VAC BIVALENT) injection Inject into the muscle. 0.3 mL 0  ? famotidine (PEPCID) 40 MG tablet Take 1 tablet  (40 mg total) by mouth at bedtime as needed for heartburn or indigestion. 90 tablet 1  ? fish oil-omega-3 fatty acids 1000 MG capsule Take 2 g by mouth daily.    ? Garlic 10 MG CAPS Take by mouth.    ? hydrochlorothiazide (HYDRODIURIL) 25 MG tablet Take 1 tablet (25 mg total) by mouth daily. 90 tablet 1  ? lisinopril (ZESTRIL) 10 MG tablet Take 1 tablet (10 mg total) by mouth daily. 90 tablet 1  ? Multiple Vitamin (MULTIVITAMIN) tablet Take 1 tablet by mouth daily.    ? omeprazole (PRILOSEC) 20 MG capsule Take 1 capsule (20 mg total) by mouth daily. 30 capsule 0  ? Red Yeast Rice 600 MG CAPS Take 1 tablet by mouth daily.    ? tiZANidine (ZANAFLEX) 2 MG tablet Take 1 tablet (2 mg total) by mouth 2 (two) times daily as needed for muscle spasms. 30 tablet 0  ? aspirin 81 MG tablet Take 81 mg by mouth daily.    ? ?No facility-administered medications prior to visit.  ? ? ?No Known Allergies ? ?Review of Systems  ?Constitutional:  Negative for fever.  ?HENT:  Positive for congestion.   ?Respiratory:  Positive for cough. Negative for sputum production.   ?Gastrointestinal:  Positive for abdominal pain (mild bloating like pain) and diarrhea. Negative for blood in stool and constipation.  ?     (-)Reflux  ? ?   ?Objective:  ?  ?Physical Exam ?Constitutional:   ?   General: He is not in acute distress. ?   Appearance: Normal appearance. He is not ill-appearing.  ?HENT:  ?   Head: Normocephalic and atraumatic.  ?   Right Ear: External ear normal.  ?   Left Ear: External ear normal.  ?Eyes:  ?   Extraocular Movements: Extraocular movements intact.  ?   Pupils: Pupils are equal, round, and reactive to light.  ?Cardiovascular:  ?   Rate and Rhythm: Normal rate and regular rhythm.  ?   Heart sounds: Normal heart sounds. No murmur heard. ?  No gallop.  ?Pulmonary:  ?   Effort: Pulmonary effort is normal. No respiratory distress.  ?   Breath sounds: Normal breath sounds. No wheezing or rales.  ?Abdominal:  ?   General: Bowel sounds  are normal. There is no distension.  ?   Palpations: Abdomen is soft.  ?   Tenderness: There is no abdominal tenderness. There is no guarding.  ?Genitourinary: ?   Rectum: Guaiac result negative.  ?Skin: ?   General: Skin is warm and dry.  ?Neurological:  ?   Mental Status:  He is alert and oriented to person, place, and time.  ?Psychiatric:     ?   Judgment: Judgment normal.  ? ? ?BP 124/88 (BP Location: Left Arm, Patient Position: Sitting, Cuff Size: Normal)   Pulse 74   Temp 98 ?F (36.7 ?C) (Oral)   Resp 18   Ht '5\' 6"'$  (1.676 m)   Wt 165 lb 9.6 oz (75.1 kg)   SpO2 97%   BMI 26.73 kg/m?  ?Wt Readings from Last 3 Encounters:  ?02/22/22 165 lb 9.6 oz (75.1 kg)  ?01/01/22 169 lb 9.6 oz (76.9 kg)  ?09/11/21 166 lb 6.4 oz (75.5 kg)  ? ? ?Diabetic Foot Exam - Simple   ?No data filed ?  ? ?Lab Results  ?Component Value Date  ? WBC 7.2 01/24/2022  ? HGB 13.8 01/24/2022  ? HCT 40.6 01/24/2022  ? PLT 195 01/24/2022  ? GLUCOSE 97 01/24/2022  ? CHOL 183 06/16/2021  ? TRIG 119.0 06/16/2021  ? HDL 52.80 06/16/2021  ? LDLCALC 106 (H) 06/16/2021  ? ALT 26 01/24/2022  ? AST 22 01/24/2022  ? NA 138 01/24/2022  ? K 4.1 01/24/2022  ? CL 102 01/24/2022  ? CREATININE 1.10 01/24/2022  ? BUN 37 (H) 01/24/2022  ? CO2 28 01/24/2022  ? TSH 1.17 02/20/2021  ? PSA 4.17 (H) 08/17/2019  ? HGBA1C 6.0 06/16/2021  ? ? ?Lab Results  ?Component Value Date  ? TSH 1.17 02/20/2021  ? ?Lab Results  ?Component Value Date  ? WBC 7.2 01/24/2022  ? HGB 13.8 01/24/2022  ? HCT 40.6 01/24/2022  ? MCV 87.3 01/24/2022  ? PLT 195 01/24/2022  ? ?Lab Results  ?Component Value Date  ? NA 138 01/24/2022  ? K 4.1 01/24/2022  ? CO2 28 01/24/2022  ? GLUCOSE 97 01/24/2022  ? BUN 37 (H) 01/24/2022  ? CREATININE 1.10 01/24/2022  ? BILITOT 0.8 01/24/2022  ? ALKPHOS 37 (L) 01/24/2022  ? AST 22 01/24/2022  ? ALT 26 01/24/2022  ? PROT 6.7 01/24/2022  ? ALBUMIN 3.6 01/24/2022  ? CALCIUM 9.4 01/24/2022  ? ANIONGAP 8 01/24/2022  ? GFR 71.39 06/16/2021  ? ?Lab Results   ?Component Value Date  ? CHOL 183 06/16/2021  ? ?Lab Results  ?Component Value Date  ? HDL 52.80 06/16/2021  ? ?Lab Results  ?Component Value Date  ? LDLCALC 106 (H) 06/16/2021  ? ?Lab Results  ?Component Value Dat

## 2022-02-22 NOTE — Patient Instructions (Addendum)
Increase fiber in your diet ?Can use probiotic otc  ? ? ?Diarrhea, Adult ?Diarrhea is frequent loose and watery bowel movements. Diarrhea can make you feel weak and cause you to become dehydrated. Dehydration can make you tired and thirsty, cause you to have a dry mouth, and decrease how often you urinate. ?Diarrhea typically lasts 2-3 days. However, it can last longer if it is a sign of something more serious. It is important to treat your diarrhea as told by your health care provider. ?Follow these instructions at home: ?Eating and drinking ?  ?Follow these recommendations as told by your health care provider: ?Take an oral rehydration solution (ORS). This is an over-the-counter medicine that helps return your body to its normal balance of nutrients and water. It is found at pharmacies and retail stores. ?Drink plenty of fluids, such as water, ice chips, diluted fruit juice, and low-calorie sports drinks. You can drink milk also, if desired. ?Avoid drinking fluids that contain a lot of sugar or caffeine, such as energy drinks, sports drinks, and soda. ?Eat bland, easy-to-digest foods in small amounts as you are able. These foods include bananas, applesauce, rice, lean meats, toast, and crackers. ?Avoid alcohol. ?Avoid spicy or fatty foods. ? ?Medicines ?Take over-the-counter and prescription medicines only as told by your health care provider. ?If you were prescribed an antibiotic medicine, take it as told by your health care provider. Do not stop using the antibiotic even if you start to feel better. ?General instructions ? ?Wash your hands often using soap and water. If soap and water are not available, use a hand sanitizer. Others in the household should wash their hands as well. Hands should be washed: ?After using the toilet or changing a diaper. ?Before preparing, cooking, or serving food. ?While caring for a sick person or while visiting someone in a hospital. ?Drink enough fluid to keep your urine pale  yellow. ?Rest at home while you recover. ?Watch your condition for any changes. ?Take a warm bath to relieve any burning or pain from frequent diarrhea episodes. ?Keep all follow-up visits as told by your health care provider. This is important. ?Contact a health care provider if: ?You have a fever. ?Your diarrhea gets worse. ?You have new symptoms. ?You cannot keep fluids down. ?You feel light-headed or dizzy. ?You have a headache. ?You have muscle cramps. ?Get help right away if: ?You have chest pain. ?You feel extremely weak or you faint. ?You have bloody or black stools or stools that look like tar. ?You have severe pain, cramping, or bloating in your abdomen. ?You have trouble breathing or you are breathing very quickly. ?Your heart is beating very quickly. ?Your skin feels cold and clammy. ?You feel confused. ?You have signs of dehydration, such as: ?Dark urine, very little urine, or no urine. ?Cracked lips. ?Dry mouth. ?Sunken eyes. ?Sleepiness. ?Weakness. ?Summary ?Diarrhea is frequent loose and sometimes watery bowel movements. Diarrhea can make you feel weak and cause you to become dehydrated. ?Drink enough fluids to keep your urine pale yellow. ?Make sure that you wash your hands after using the toilet. If soap and water are not available, use hand sanitizer. ?Contact a health care provider if your diarrhea gets worse or you have new symptoms. ?Get help right away if you have signs of dehydration. ?This information is not intended to replace advice given to you by your health care provider. Make sure you discuss any questions you have with your health care provider. ?Document Revised: 05/24/2021 Document Reviewed:  05/24/2021 ?Elsevier Patient Education ? [place]. ? ?

## 2022-02-22 NOTE — Assessment & Plan Note (Signed)
Inc fiber in diet  ?Actually loose stool , not diarrhea ?Probiotic  ??if from sinusitis which is resolving on its own  ?Check labs  ?

## 2022-03-12 ENCOUNTER — Ambulatory Visit: Payer: Managed Care, Other (non HMO) | Admitting: Family Medicine

## 2022-04-17 ENCOUNTER — Ambulatory Visit: Payer: Managed Care, Other (non HMO) | Admitting: Family Medicine

## 2022-05-05 ENCOUNTER — Other Ambulatory Visit: Payer: Self-pay | Admitting: Family Medicine

## 2022-05-10 ENCOUNTER — Ambulatory Visit: Payer: Managed Care, Other (non HMO) | Admitting: Family Medicine

## 2022-05-27 ENCOUNTER — Other Ambulatory Visit: Payer: Self-pay | Admitting: Family Medicine

## 2022-06-11 NOTE — Progress Notes (Unsigned)
Subjective:    [name], male    DOB: Mar 24, 1961, 61 y.o.   MRN: [id number]  No chief complaint on file.   HPI Patient is in today for a follow up.  Past Medical History:  Diagnosis Date   Allergy    Constipation 12/25/2015   Dysrhythmia    sinus arrythmia-2006-work up negative findings   Hyperglycemia 03/15/2015   Hyperlipemia    Hypertension    Leg pain, diffuse, right 04/15/2017   Preventative health care 03/20/2015   Sees Dr Collene Mares of gastroenterology    Past Surgical History:  Procedure Laterality Date   KNEE ARTHROSCOPY  2009   rt knee   KNEE ARTHROSCOPY  01/16/2012   Procedure: ARTHROSCOPY KNEE;  Surgeon: Gearlean Alf, MD;  Location: [facility];  Service: Orthopedics;  Laterality: Left;  WITH DEBRIDEMENT   VENTRAL HERNIA REPAIR      Family History  Problem Relation Age of Onset   Hypertension Mother    Diabetes Mother        labile   Kidney disease Mother    Peripheral vascular disease Mother    Diabetes Maternal Aunt    Heart disease Maternal Aunt    Heart disease Father        MI?   Drug abuse Son    Heart disease Cousin    Diabetes Cousin    Alcohol abuse Cousin     Social History   Socioeconomic History   Marital status: Married    Spouse name: Delora   Number of children: Not on file   Years of education: 12   Highest education level: Not on file  Occupational History   Occupation: Administrator for [employer]  Tobacco Use   Smoking status: Never   Smokeless tobacco: Never  Vaping Use   Vaping Use: Never used  Substance and Sexual Activity   Alcohol use: No   Drug use: No   Sexual activity: Yes    Comment: lives with wife, no major dietary restrictions, works for [employer]  Other Topics Concern   Not on file  Social History Narrative   Not on file   Social Determinants of Health   Financial Resource Strain: Not on file  Food Insecurity: Not on file  Transportation Needs: Not on file  Physical  Activity: Not on file  Stress: Not on file  Social Connections: Not on file  Intimate Partner Violence: Not on file    Outpatient Medications Prior to Visit  Medication Sig Dispense Refill   COVID-19 mRNA bivalent vaccine, Pfizer, (PFIZER COVID-19 VAC BIVALENT) injection Inject into the muscle. 0.3 mL 0   famotidine (PEPCID) 40 MG tablet Take 1 tablet (40 mg total) by mouth at bedtime as needed for heartburn or indigestion. 90 tablet 1   fish oil-omega-3 fatty acids 1000 MG capsule Take 2 g by mouth daily.     Garlic 10 MG CAPS Take by mouth.     hydrochlorothiazide (HYDRODIURIL) 25 MG tablet Take 1 tablet by mouth once daily 90 tablet 0   lisinopril (ZESTRIL) 10 MG tablet Take 1 tablet by mouth once daily 90 tablet 1   Multiple Vitamin (MULTIVITAMIN) tablet Take 1 tablet by mouth daily.     omeprazole (PRILOSEC) 20 MG capsule Take 1 capsule (20 mg total) by mouth daily. 30 capsule 0   Red Yeast Rice 600 MG CAPS Take 1 tablet by mouth daily.     tiZANidine (ZANAFLEX) 2 MG tablet Take  1 tablet (2 mg total) by mouth 2 (two) times daily as needed for muscle spasms. 30 tablet 0   No facility-administered medications prior to visit.    No Known Allergies  ROS     Objective:    Physical Exam  There were no vitals taken for this visit. Wt Readings from Last 3 Encounters:  02/22/22 165 lb 9.6 oz (75.1 kg)  01/01/22 169 lb 9.6 oz (76.9 kg)  09/11/21 166 lb 6.4 oz (75.5 kg)    Diabetic Foot Exam - Simple   No data filed    Lab Results  Component Value Date   WBC 2.9 (L) 02/22/2022   HGB 14.3 02/22/2022   HCT 43.1 02/22/2022   PLT 196.0 02/22/2022   GLUCOSE 86 02/22/2022   CHOL 183 06/16/2021   TRIG 119.0 06/16/2021   HDL 52.80 06/16/2021   LDLCALC 106 (H) 06/16/2021   ALT 37 02/22/2022   AST 23 02/22/2022   NA 139 02/22/2022   K 4.1 02/22/2022   CL 100 02/22/2022   CREATININE 1.15 02/22/2022   BUN 19 02/22/2022   CO2 31 02/22/2022   TSH 2.51 02/22/2022   PSA 4.17  (H) 08/17/2019   HGBA1C 6.0 06/16/2021    Lab Results  Component Value Date   TSH 2.51 02/22/2022   Lab Results  Component Value Date   WBC 2.9 (L) 02/22/2022   HGB 14.3 02/22/2022   HCT 43.1 02/22/2022   MCV 89.3 02/22/2022   PLT 196.0 02/22/2022   Lab Results  Component Value Date   NA 139 02/22/2022   K 4.1 02/22/2022   CO2 31 02/22/2022   GLUCOSE 86 02/22/2022   BUN 19 02/22/2022   CREATININE 1.15 02/22/2022   BILITOT 0.4 02/22/2022   ALKPHOS 63 02/22/2022   AST 23 02/22/2022   ALT 37 02/22/2022   PROT 6.9 02/22/2022   ALBUMIN 4.3 02/22/2022   CALCIUM 8.8 02/22/2022   ANIONGAP 8 01/24/2022   GFR 68.09 02/22/2022   Lab Results  Component Value Date   CHOL 183 06/16/2021   Lab Results  Component Value Date   HDL 52.80 06/16/2021   Lab Results  Component Value Date   LDLCALC 106 (H) 06/16/2021   Lab Results  Component Value Date   TRIG 119.0 06/16/2021   Lab Results  Component Value Date   CHOLHDL 3 06/16/2021   Lab Results  Component Value Date   HGBA1C 6.0 06/16/2021       Assessment & Plan:      Problem List Items Addressed This Visit   None   I am having Fawn Kirk maintain his Garlic, multivitamin, fish oil-omega-3 fatty acids, Red Yeast Rice, famotidine, tiZANidine, Pfizer COVID-19 Vac Bivalent, omeprazole, hydrochlorothiazide, and lisinopril.  No orders of the defined types were placed in this encounter.

## 2022-06-12 ENCOUNTER — Ambulatory Visit (INDEPENDENT_AMBULATORY_CARE_PROVIDER_SITE_OTHER): Payer: Managed Care, Other (non HMO) | Admitting: Family Medicine

## 2022-06-12 ENCOUNTER — Encounter: Payer: Self-pay | Admitting: Family Medicine

## 2022-06-12 VITALS — BP 136/78 | HR 76 | Resp 20 | Ht 66.0 in | Wt 162.6 lb

## 2022-06-12 DIAGNOSIS — D72819 Decreased white blood cell count, unspecified: Secondary | ICD-10-CM

## 2022-06-12 DIAGNOSIS — R739 Hyperglycemia, unspecified: Secondary | ICD-10-CM

## 2022-06-12 DIAGNOSIS — E782 Mixed hyperlipidemia: Secondary | ICD-10-CM | POA: Diagnosis not present

## 2022-06-12 DIAGNOSIS — K529 Noninfective gastroenteritis and colitis, unspecified: Secondary | ICD-10-CM

## 2022-06-12 DIAGNOSIS — I10 Essential (primary) hypertension: Secondary | ICD-10-CM | POA: Diagnosis not present

## 2022-06-12 LAB — CBC WITH DIFFERENTIAL/PLATELET
Basophils Absolute: 0 10*3/uL (ref 0.0–0.1)
Basophils Relative: 0.7 % (ref 0.0–3.0)
Eosinophils Absolute: 0.2 10*3/uL (ref 0.0–0.7)
Eosinophils Relative: 3.8 % (ref 0.0–5.0)
HCT: 44.8 % (ref 39.0–52.0)
Hemoglobin: 14.9 g/dL (ref 13.0–17.0)
Lymphocytes Relative: 41.3 % (ref 12.0–46.0)
Lymphs Abs: 1.8 10*3/uL (ref 0.7–4.0)
MCHC: 33.3 g/dL (ref 30.0–36.0)
MCV: 86.8 fl (ref 78.0–100.0)
Monocytes Absolute: 0.5 10*3/uL (ref 0.1–1.0)
Monocytes Relative: 11.1 % (ref 3.0–12.0)
Neutro Abs: 1.8 10*3/uL (ref 1.4–7.7)
Neutrophils Relative %: 43.1 % (ref 43.0–77.0)
Platelets: 183 10*3/uL (ref 150.0–400.0)
RBC: 5.17 Mil/uL (ref 4.22–5.81)
RDW: 15.5 % (ref 11.5–15.5)
WBC: 4.3 10*3/uL (ref 4.0–10.5)

## 2022-06-12 LAB — COMPREHENSIVE METABOLIC PANEL
ALT: 20 U/L (ref 0–53)
AST: 17 U/L (ref 0–37)
Albumin: 4.1 g/dL (ref 3.5–5.2)
Alkaline Phosphatase: 46 U/L (ref 39–117)
BUN: 19 mg/dL (ref 6–23)
CO2: 32 mEq/L (ref 19–32)
Calcium: 9.5 mg/dL (ref 8.4–10.5)
Chloride: 102 mEq/L (ref 96–112)
Creatinine, Ser: 1.07 mg/dL (ref 0.40–1.50)
GFR: 74.09 mL/min (ref >60.00)
Glucose, Bld: 85 mg/dL (ref 70–99)
Potassium: 4 mEq/L (ref 3.5–5.1)
Sodium: 140 mEq/L (ref 135–145)
Total Bilirubin: 0.8 mg/dL (ref 0.2–1.2)
Total Protein: 6.6 g/dL (ref 6.0–8.3)

## 2022-06-12 LAB — LIPID PANEL
Cholesterol: 223 mg/dL — ABNORMAL HIGH (ref 0–200)
HDL: 54.9 mg/dL (ref >39.00)
LDL Cholesterol: 155 mg/dL — ABNORMAL HIGH (ref 0–99)
NonHDL: 168.02
Total CHOL/HDL Ratio: 4
Triglycerides: 64 mg/dL (ref 0.0–149.0)
VLDL: 12.8 mg/dL (ref 0.0–40.0)

## 2022-06-12 LAB — HEMOGLOBIN A1C: Hgb A1c MFr Bld: 6.3 % (ref 4.6–6.5)

## 2022-06-12 LAB — TSH: TSH: 0.91 u[IU]/mL (ref 0.35–5.50)

## 2022-06-12 NOTE — Patient Instructions (Addendum)
[place] tea do not drink  Daily probiotic, NOW company multistrain at [facility]   Viral Gastroenteritis, Adult     Viral gastroenteritis is also known as the stomach flu. This condition may affect your stomach, small intestine, and large intestine. It can cause sudden watery diarrhea, fever, and vomiting. This condition is caused by many different viruses. These viruses can be passed from person to person very easily (are contagious). Diarrhea and vomiting can make you feel weak and cause you to become dehydrated. You may not be able to keep fluids down. Dehydration can make you tired and thirsty, cause you to have a dry mouth, and decrease how often you urinate. It is important to replace the fluids that you lose from diarrhea and vomiting. What are the causes? Gastroenteritis is caused by many viruses, including rotavirus and norovirus. Norovirus is the most common cause in adults. You can get sick after being exposed to the viruses from other people. You can also get sick by: Eating food, drinking water, or touching a surface contaminated with one of these viruses. Sharing utensils or other personal items with an infected person. What increases the risk? You are more likely to develop this condition if you: Have a weak body defense system (immune system). Live with one or more children who are younger than 2 years. Live in a nursing home. Travel on cruise ships. What are the signs or symptoms? Symptoms of this condition start suddenly 1-3 days after exposure to a virus. Symptoms may last for a few days or for as long as a week. Common symptoms include watery diarrhea and vomiting. Other symptoms include: Fever. Headache. Fatigue. Pain in the abdomen. Chills. Weakness. Nausea. Muscle aches. Loss of appetite. How is this diagnosed? This condition is diagnosed with a medical history and physical exam. You may also have a stool test to check for viruses or other  infections. How is this treated? This condition typically goes away on its own. The focus of treatment is to prevent dehydration and restore lost fluids (rehydration). This condition may be treated with: An oral rehydration solution (ORS) to replace important salts and minerals (electrolytes) in your body. Take this if told by your health care provider. This is a drink that is sold at pharmacies and retail stores. Medicines to help with your symptoms. Probiotic supplements to reduce symptoms of diarrhea. Fluids given through an IV, if dehydration is severe. Older adults and people with other diseases or a weak immune system are at higher risk for dehydration. Follow these instructions at home: Eating and drinking  Take an ORS as told by your health care provider. Drink clear fluids in small amounts as you are able. Clear fluids include: Water. Ice chips. Diluted fruit juice. Low-calorie sports drinks. Drink enough fluid to keep your urine pale yellow. Eat small amounts of healthy foods every 3-4 hours as you are able. This may include whole grains, fruits, vegetables, lean meats, and yogurt. Avoid fluids that contain a lot of sugar or caffeine, such as energy drinks, sports drinks, and soda. Avoid spicy or fatty foods. Avoid alcohol. General instructions  Wash your hands often, especially after having diarrhea or vomiting. If soap and water are not available, use hand sanitizer. Make sure that all people in your household wash their hands well and often. Take over-the-counter and prescription medicines only as told by your health care provider. Rest at home while you recover. Watch your condition for any changes. Take a warm bath to  relieve any burning or pain from frequent diarrhea episodes. Keep all follow-up visits. This is important. Contact a health care provider if you: Cannot keep fluids down. Have symptoms that get worse. Have new symptoms. Feel light-headed or dizzy. Have  muscle cramps. Get help right away if you: Have chest pain. Have trouble breathing or you are breathing very quickly. Have a fast heartbeat. Feel extremely weak or you faint. Have a severe headache, a stiff neck, or both. Have a rash. Have severe pain, cramping, or bloating in your abdomen. Have skin that feels cold and clammy. Feel confused. Have pain when you urinate. Have signs of dehydration, such as: Dark urine, very little urine, or no urine. Cracked lips. Dry mouth. Sunken eyes. Sleepiness. Weakness. Have signs of bleeding, such as: Seeing blood in your vomit. Having vomit that looks like coffee grounds. Having bloody or black stools or stools that look like tar. These symptoms may be an emergency. Get help right away. Call 911. Do not wait to see if the symptoms will go away. Do not drive yourself to the hospital. Summary Viral gastroenteritis is also known as the stomach flu. It can cause sudden watery diarrhea, fever, and vomiting. This condition can be passed from person to person very easily (is contagious). Take an oral rehydration solution (ORS) if told by your health care provider. This is a drink that is sold at pharmacies and retail stores. Wash your hands often, especially after having diarrhea or vomiting. If soap and water are not available, use hand sanitizer. This information is not intended to replace advice given to you by your health care provider. Make sure you discuss any questions you have with your health care provider. Document Revised: 09/11/2021 Document Reviewed: 09/11/2021 Elsevier Patient Education  [place].

## 2022-06-13 ENCOUNTER — Other Ambulatory Visit: Payer: Self-pay

## 2022-06-13 DIAGNOSIS — E782 Mixed hyperlipidemia: Secondary | ICD-10-CM

## 2022-06-13 DIAGNOSIS — I10 Essential (primary) hypertension: Secondary | ICD-10-CM

## 2022-06-13 DIAGNOSIS — D72819 Decreased white blood cell count, unspecified: Secondary | ICD-10-CM | POA: Insufficient documentation

## 2022-06-13 DIAGNOSIS — K529 Noninfective gastroenteritis and colitis, unspecified: Secondary | ICD-10-CM | POA: Insufficient documentation

## 2022-06-13 NOTE — Assessment & Plan Note (Signed)
hgba1c acceptable, minimize simple carbs. Increase exercise as tolerated.  

## 2022-06-13 NOTE — Assessment & Plan Note (Signed)
Encourage heart healthy diet such as MIND or DASH diet, increase exercise, avoid trans fats, simple carbohydrates and processed foods, consider a krill or fish or flaxseed oil cap daily.  °

## 2022-06-13 NOTE — Assessment & Plan Note (Signed)
He had a recent illness with diarrhea and discomfort he feels better now but he does note he had had one other similar illness earlier in the year. He is feeling better and he is advised to report if symptoms recur and to maintain probiotics and fiber supplement daily for now

## 2022-06-13 NOTE — Assessment & Plan Note (Signed)
Well controlled, no changes to meds. Encouraged heart healthy diet such as the DASH diet and exercise as tolerated.  °

## 2022-06-13 NOTE — Assessment & Plan Note (Signed)
Resolved on recheck likely post viral

## 2022-06-18 ENCOUNTER — Telehealth: Payer: Self-pay | Admitting: Family Medicine

## 2022-06-18 NOTE — Telephone Encounter (Signed)
Pt called stating that he is still having problems with his stomach and wanted to look into that referral Dr. Charlett Blake had mentioned about placing Dr. Collene Mares off [facility]. In [place]. Pt would like a call when placed.

## 2022-06-19 ENCOUNTER — Other Ambulatory Visit: Payer: Self-pay

## 2022-06-19 DIAGNOSIS — K219 Gastro-esophageal reflux disease without esophagitis: Secondary | ICD-10-CM

## 2022-06-19 DIAGNOSIS — K529 Noninfective gastroenteritis and colitis, unspecified: Secondary | ICD-10-CM

## 2022-06-19 NOTE — Telephone Encounter (Signed)
Referral placed.  Mychart message sent. 

## 2022-08-05 ENCOUNTER — Other Ambulatory Visit: Payer: Self-pay | Admitting: Family Medicine

## 2022-09-14 ENCOUNTER — Other Ambulatory Visit (INDEPENDENT_AMBULATORY_CARE_PROVIDER_SITE_OTHER): Payer: Managed Care, Other (non HMO)

## 2022-09-14 DIAGNOSIS — I10 Essential (primary) hypertension: Secondary | ICD-10-CM | POA: Diagnosis not present

## 2022-09-14 DIAGNOSIS — E782 Mixed hyperlipidemia: Secondary | ICD-10-CM

## 2022-09-14 LAB — COMPREHENSIVE METABOLIC PANEL
ALT: 25 U/L (ref 0–53)
AST: 19 U/L (ref 0–37)
Albumin: 4 g/dL (ref 3.5–5.2)
Alkaline Phosphatase: 52 U/L (ref 39–117)
BUN: 24 mg/dL — ABNORMAL HIGH (ref 6–23)
CO2: 27 mEq/L (ref 19–32)
Calcium: 9.4 mg/dL (ref 8.4–10.5)
Chloride: 99 mEq/L (ref 96–112)
Creatinine, Ser: 1.32 mg/dL (ref 0.40–1.50)
GFR: 57.48 mL/min — ABNORMAL LOW (ref >60.00)
Glucose, Bld: 144 mg/dL — ABNORMAL HIGH (ref 70–99)
Potassium: 3.7 mEq/L (ref 3.5–5.1)
Sodium: 138 mEq/L (ref 135–145)
Total Bilirubin: 0.4 mg/dL (ref 0.2–1.2)
Total Protein: 6.5 g/dL (ref 6.0–8.3)

## 2022-09-14 LAB — CBC WITH DIFFERENTIAL/PLATELET
Basophils Absolute: 0 10*3/uL (ref 0.0–0.1)
Basophils Relative: 0.6 % (ref 0.0–3.0)
Eosinophils Absolute: 0.2 10*3/uL (ref 0.0–0.7)
Eosinophils Relative: 4.3 % (ref 0.0–5.0)
HCT: 45.3 % (ref 39.0–52.0)
Hemoglobin: 15.1 g/dL (ref 13.0–17.0)
Lymphocytes Relative: 39 % (ref 12.0–46.0)
Lymphs Abs: 1.6 10*3/uL (ref 0.7–4.0)
MCHC: 33.3 g/dL (ref 30.0–36.0)
MCV: 89.7 fl (ref 78.0–100.0)
Monocytes Absolute: 0.4 10*3/uL (ref 0.1–1.0)
Monocytes Relative: 8.6 % (ref 3.0–12.0)
Neutro Abs: 2 10*3/uL (ref 1.4–7.7)
Neutrophils Relative %: 47.5 % (ref 43.0–77.0)
Platelets: 189 10*3/uL (ref 150.0–400.0)
RBC: 5.05 Mil/uL (ref 4.22–5.81)
RDW: 14.8 % (ref 11.5–15.5)
WBC: 4.1 10*3/uL (ref 4.0–10.5)

## 2022-09-14 LAB — LIPID PANEL
Cholesterol: 208 mg/dL — ABNORMAL HIGH (ref 0–200)
HDL: 52.8 mg/dL (ref >39.00)
LDL Cholesterol: 138 mg/dL — ABNORMAL HIGH (ref 0–99)
NonHDL: 154.9
Total CHOL/HDL Ratio: 4
Triglycerides: 84 mg/dL (ref 0.0–149.0)
VLDL: 16.8 mg/dL (ref 0.0–40.0)

## 2022-09-17 ENCOUNTER — Other Ambulatory Visit: Payer: Self-pay

## 2022-09-17 DIAGNOSIS — R739 Hyperglycemia, unspecified: Secondary | ICD-10-CM

## 2022-09-17 DIAGNOSIS — I10 Essential (primary) hypertension: Secondary | ICD-10-CM

## 2022-09-17 DIAGNOSIS — E785 Hyperlipidemia, unspecified: Secondary | ICD-10-CM

## 2022-09-18 ENCOUNTER — Other Ambulatory Visit (INDEPENDENT_AMBULATORY_CARE_PROVIDER_SITE_OTHER): Payer: Managed Care, Other (non HMO)

## 2022-09-18 DIAGNOSIS — R739 Hyperglycemia, unspecified: Secondary | ICD-10-CM

## 2022-09-18 LAB — HEMOGLOBIN A1C: Hgb A1c MFr Bld: 5.9 % (ref 4.6–6.5)

## 2022-10-04 ENCOUNTER — Other Ambulatory Visit: Payer: Self-pay | Admitting: Gastroenterology

## 2022-10-04 DIAGNOSIS — R1033 Periumbilical pain: Secondary | ICD-10-CM

## 2022-10-05 ENCOUNTER — Ambulatory Visit: Payer: Managed Care, Other (non HMO) | Admitting: Family

## 2022-10-09 ENCOUNTER — Ambulatory Visit
Admission: RE | Admit: 2022-10-09 | Discharge: 2022-10-09 | Disposition: A | Discharge status: Home / Self Care | Payer: Managed Care, Other (non HMO) | Source: Ambulatory Visit | Attending: Gastroenterology | Admitting: Gastroenterology

## 2022-10-09 ENCOUNTER — Other Ambulatory Visit (HOSPITAL_COMMUNITY): Payer: Self-pay | Admitting: Gastroenterology

## 2022-10-09 DIAGNOSIS — R1011 Right upper quadrant pain: Secondary | ICD-10-CM

## 2022-10-09 DIAGNOSIS — R1033 Periumbilical pain: Secondary | ICD-10-CM

## 2022-10-22 ENCOUNTER — Ambulatory Visit (HOSPITAL_COMMUNITY)
Admission: RE | Admit: 2022-10-22 | Discharge: 2022-10-22 | Disposition: A | Discharge status: Home / Self Care | Payer: Managed Care, Other (non HMO) | Source: Ambulatory Visit | Attending: Gastroenterology | Admitting: Gastroenterology

## 2022-10-22 DIAGNOSIS — R1011 Right upper quadrant pain: Secondary | ICD-10-CM | POA: Insufficient documentation

## 2022-10-22 MED ORDER — TECHNETIUM TC 99M MEBROFENIN IV KIT
5.1000 | PACK | Freq: Once | INTRAVENOUS | Status: AC | PRN
  Administered 2022-10-22: 5.1 via INTRAVENOUS

## 2022-11-28 ENCOUNTER — Other Ambulatory Visit: Payer: Managed Care, Other (non HMO)

## 2022-12-09 ENCOUNTER — Other Ambulatory Visit: Payer: Self-pay | Admitting: Family Medicine

## 2022-12-17 ENCOUNTER — Other Ambulatory Visit (INDEPENDENT_AMBULATORY_CARE_PROVIDER_SITE_OTHER): Payer: 59

## 2022-12-17 DIAGNOSIS — I10 Essential (primary) hypertension: Secondary | ICD-10-CM

## 2022-12-17 DIAGNOSIS — R739 Hyperglycemia, unspecified: Secondary | ICD-10-CM

## 2022-12-17 DIAGNOSIS — E785 Hyperlipidemia, unspecified: Secondary | ICD-10-CM | POA: Diagnosis not present

## 2022-12-17 LAB — COMPREHENSIVE METABOLIC PANEL
ALT: 23 U/L (ref 0–53)
AST: 17 U/L (ref 0–37)
Albumin: 4 g/dL (ref 3.5–5.2)
Alkaline Phosphatase: 51 U/L (ref 39–117)
BUN: 18 mg/dL (ref 6–23)
CO2: 33 mEq/L — ABNORMAL HIGH (ref 19–32)
Calcium: 9.3 mg/dL (ref 8.4–10.5)
Chloride: 99 mEq/L (ref 96–112)
Creatinine, Ser: 1.12 mg/dL (ref 0.40–1.50)
GFR: 69.89 mL/min (ref >60.00)
Glucose, Bld: 106 mg/dL — ABNORMAL HIGH (ref 70–99)
Potassium: 3.9 mEq/L (ref 3.5–5.1)
Sodium: 144 mEq/L (ref 135–145)
Total Bilirubin: 0.5 mg/dL (ref 0.2–1.2)
Total Protein: 6.7 g/dL (ref 6.0–8.3)

## 2022-12-17 LAB — LIPID PANEL
Cholesterol: 219 mg/dL — ABNORMAL HIGH (ref 0–200)
HDL: 61.1 mg/dL (ref >39.00)
LDL Cholesterol: 143 mg/dL — ABNORMAL HIGH (ref 0–99)
NonHDL: 157.97
Total CHOL/HDL Ratio: 4
Triglycerides: 77 mg/dL (ref 0.0–149.0)
VLDL: 15.4 mg/dL (ref 0.0–40.0)

## 2022-12-17 LAB — HEMOGLOBIN A1C: Hgb A1c MFr Bld: 6 % (ref 4.6–6.5)

## 2022-12-30 NOTE — Assessment & Plan Note (Signed)
Encourage heart healthy diet such as MIND or DASH diet, increase exercise, avoid trans fats, simple carbohydrates and processed foods, consider a krill or fish or flaxseed oil cap daily.  °

## 2022-12-30 NOTE — Assessment & Plan Note (Signed)
Patient encouraged to maintain heart healthy diet, regular exercise, adequate sleep. Consider daily probiotics. Take medications as prescribed. Labs reviewed. Given and reviewed copy of ACP documents from [employer] and encouraged to complete and return. Last colonoscopy 2022 repeat in 2032 for screening purposes

## 2022-12-30 NOTE — Assessment & Plan Note (Signed)
Nursing note reviewed. Agree with documention and plan.

## 2022-12-30 NOTE — Assessment & Plan Note (Signed)
Well controlled, no changes to meds. Encouraged heart healthy diet such as the DASH diet and exercise as tolerated.  

## 2022-12-30 NOTE — Assessment & Plan Note (Signed)
hgba1c acceptable, minimize simple carbs. Increase exercise as tolerated.  

## 2022-12-31 ENCOUNTER — Ambulatory Visit (INDEPENDENT_AMBULATORY_CARE_PROVIDER_SITE_OTHER): Payer: 59 | Admitting: Family Medicine

## 2022-12-31 ENCOUNTER — Encounter: Payer: Self-pay | Admitting: Family Medicine

## 2022-12-31 VITALS — BP 126/72 | HR 67 | Temp 98.0°F | Resp 16 | Ht 66.0 in | Wt 158.0 lb

## 2022-12-31 DIAGNOSIS — Z0000 Encounter for general adult medical examination without abnormal findings: Secondary | ICD-10-CM

## 2022-12-31 DIAGNOSIS — K219 Gastro-esophageal reflux disease without esophagitis: Secondary | ICD-10-CM

## 2022-12-31 DIAGNOSIS — R739 Hyperglycemia, unspecified: Secondary | ICD-10-CM | POA: Diagnosis not present

## 2022-12-31 DIAGNOSIS — E782 Mixed hyperlipidemia: Secondary | ICD-10-CM

## 2022-12-31 DIAGNOSIS — I10 Essential (primary) hypertension: Secondary | ICD-10-CM | POA: Diagnosis not present

## 2022-12-31 NOTE — Patient Instructions (Signed)
Preventive Care 50-64 Years Old, Male Preventive care refers to lifestyle choices and visits with your health care provider that can promote health and wellness. Preventive care visits are also called wellness exams. What can I expect for my preventive care visit? Counseling During your preventive care visit, your health care provider may ask about your: Medical history, including: Past medical problems. Family medical history. Current health, including: Emotional well-being. Home life and relationship well-being. Sexual activity. Lifestyle, including: Alcohol, nicotine or tobacco, and drug use. Access to firearms. Diet, exercise, and sleep habits. Safety issues such as seatbelt and bike helmet use. Sunscreen use. Work and work Statistician. Physical exam Your health care provider will check your: Height and weight. These may be used to calculate your BMI (body mass index). BMI is a measurement that tells if you are at a healthy weight. Waist circumference. This measures the distance around your waistline. This measurement also tells if you are at a healthy weight and may help predict your risk of certain diseases, such as type 2 diabetes and high blood pressure. Heart rate and blood pressure. Body temperature. Skin for abnormal spots. What immunizations do I need?  Vaccines are usually given at various ages, according to a schedule. Your health care provider will recommend vaccines for you based on your age, medical history, and lifestyle or other factors, such as travel or where you work. What tests do I need? Screening Your health care provider may recommend screening tests for certain conditions. This may include: Lipid and cholesterol levels. Diabetes screening. This is done by checking your blood sugar (glucose) after you have not eaten for a while (fasting). Hepatitis B test. Hepatitis C test. HIV (human immunodeficiency virus) test. STI (sexually transmitted infection)  testing, if you are at risk. Lung cancer screening. Prostate cancer screening. Colorectal cancer screening. Talk with your health care provider about your test results, treatment options, and if necessary, the need for more tests. Follow these instructions at home: Eating and drinking  Eat a diet that includes fresh fruits and vegetables, whole grains, lean protein, and low-fat dairy products. Take vitamin and mineral supplements as recommended by your health care provider. Do not drink alcohol if your health care provider tells you not to drink. If you drink alcohol: Limit how much you have to 0-2 drinks a day. Know how much alcohol is in your drink. In the [place] one drink equals one 12 oz bottle of beer (355 mL), one 5 oz glass of wine (148 mL), or one 1 oz glass of hard liquor (44 mL). Lifestyle Brush your teeth every morning and night with fluoride toothpaste. Floss one time each day. Exercise for at least 30 minutes 5 or more days each week. Do not use any products that contain nicotine or tobacco. These products include cigarettes, chewing tobacco, and vaping devices, such as e-cigarettes. If you need help quitting, ask your health care provider. Do not use drugs. If you are sexually active, practice safe sex. Use a condom or other form of protection to prevent STIs. Take aspirin only as told by your health care provider. Make sure that you understand how much to take and what form to take. Work with your health care provider to find out whether it is safe and beneficial for you to take aspirin daily. Find healthy ways to manage stress, such as: Meditation, yoga, or listening to music. Journaling. Talking to a trusted person. Spending time with friends and family. Minimize exposure to UV radiation to reduce  your risk of skin cancer. ?Safety ?Always wear your seat belt while driving or riding in a vehicle. ?Do not drive: ?If you have been drinking alcohol. Do not ride with someone who  has been drinking. ?When you are tired or distracted. ?While texting. ?If you have been using any mind-altering substances or drugs. ?Wear a helmet and other protective equipment during sports activities. ?If you have firearms in your house, make sure you follow all gun safety procedures. ?What's next? ?Go to your health care provider once a year for an annual wellness visit. ?Ask your health care provider how often you should have your eyes and teeth checked. ?Stay up to date on all vaccines. ?This information is not intended to replace advice given to you by your health care provider. Make sure you discuss any questions you have with your health care provider. ?Document Revised: 05/10/2021 Document Reviewed: 05/10/2021 ?Elsevier Patient Education ? 2023 Elsevier [employer] ? ?

## 2022-12-31 NOTE — Progress Notes (Signed)
Subjective:   By signing my name below, I, [name], attest that this documentation has been prepared under the direction and in the presence of Mosie Lukes., MD. 12/31/2022.    [name], male    DOB: 11/11/1961, 61 y.o.   MRN: [id number]  Chief Complaint  Patient presents with   Annual Exam    Annual Exam     HPI Patient is in today for a comprehensive physical exam.   Recently he had sinus pain/pressure at the base of his skull. At this visit his pain is resolved.   He complains of insomnia that has been ongoing for years. Typically he doesn't fall asleep until 1 AM and wakes up around 6 AM. 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  Social history: He has been working at [employer] for 25 years. In his family history, his brother died in Apr 13, 2022. His mother died of kidney disease secondary to diabetes when she was about 64 yo. Colonoscopy: Last completed 08/25/2021. PSA: Last completed 08/17/2019. Immunizations: Last COVID booster received 09/11/2021. He does not receive influenza vaccines. Tetanus vaccine UTD (last received 04/10/2016. UTD on Shingrix. Diet: He has noticed a correlation between eating ice cream and onset of his gastric pain. There has been significant improvement in his symptoms since avoiding ice cream. He is also making other dietary changes such as consuming probiotics and oatmeal more often. We discussed dietary recommendations today. His last hemoglobin A1c was 6.0. Of note, he is also on a multivitamin and fish oil supplementation. Lab Results  Component Value Date   HGBA1C 6.0 12/17/2022   Exercise: Currently he exercises 5 days a week. Dental: He follows up with his dentist regularly. Vision:  Denies having any fever, new muscle pain, joint pain , new moles, congestion, sinus pain, sore throat, chest pain, palpations, cough, SOB ,wheezing,n/v/d constipation, blood in stool,  dysuria, frequency, hematuria, at this time.   Past Medical History:  Diagnosis Date   Allergy    Constipation 12/25/2015   Dysrhythmia    sinus arrythmia-2006-work up negative findings   Hyperglycemia 03/15/2015   Hyperlipemia    Hypertension    Leg pain, diffuse, right 04/15/2017   Preventative health care 03/20/2015   Sees Dr Collene Mares of gastroenterology    Past Surgical History:  Procedure Laterality Date   KNEE ARTHROSCOPY  2009   rt knee   KNEE ARTHROSCOPY  01/16/2012   Procedure: ARTHROSCOPY KNEE;  Surgeon: Gearlean Alf, MD;  Location: [facility];  Service: Orthopedics;  Laterality: Left;  WITH DEBRIDEMENT   VENTRAL HERNIA REPAIR      Family History  Problem Relation Age of Onset   Hypertension Mother    Diabetes Mother        labile   Kidney disease Mother        ESRD   Peripheral vascular disease Mother    Heart disease Father        MI?   Learning disabilities Daughter    Drug abuse Son    Diabetes Maternal Aunt    Heart disease Maternal Aunt    Heart disease Cousin    Diabetes Cousin    Alcohol abuse Cousin     Social History   Socioeconomic History   Marital status: Married    Spouse name: Delora   Number of children: Not on file   Years of education: 12  Highest education level: Not on file  Occupational History   Occupation: Administrator for [employer]  Tobacco Use   Smoking status: Never   Smokeless tobacco: Never  Vaping Use   Vaping Use: Never used  Substance and Sexual Activity   Alcohol use: No   Drug use: No   Sexual activity: Yes    Comment: lives with wife, no major dietary restrictions, works for [employer]  Other Topics Concern   Not on file  Social History Narrative   Not on file   Social Determinants of Health   Financial Resource Strain: Not on file  Food Insecurity: Not on file  Transportation Needs: Not on file  Physical Activity: Not on file  Stress: Not on file  Social Connections: Not on file  Intimate  Partner Violence: Not on file    Outpatient Medications Prior to Visit  Medication Sig Dispense Refill   ASPIRIN 81 PO Take by mouth daily.     Cholecalciferol (D3 PO) Take by mouth daily.     COD LIVER OIL PO Take by mouth daily.     fish oil-omega-3 fatty acids 1000 MG capsule Take 2 g by mouth daily.     Garlic 10 MG CAPS Take by mouth.     hydrochlorothiazide (HYDRODIURIL) 25 MG tablet Take 1 tablet (25 mg total) by mouth daily. 90 tablet 1   lisinopril (ZESTRIL) 10 MG tablet Take 1 tablet by mouth once daily 90 tablet 0   Multiple Vitamin (MULTIVITAMIN) tablet Take 1 tablet by mouth daily.     Red Yeast Rice 600 MG CAPS Take 1 tablet by mouth daily.     ZINC CITRATE PO Take by mouth daily.     No facility-administered medications prior to visit.    No Known Allergies  Review of Systems  Constitutional:  Negative for fever.  HENT:  Negative for congestion, sinus pain and sore throat.   Respiratory:  Negative for cough, shortness of breath and wheezing.   Cardiovascular:  Negative for chest pain and palpitations.  Gastrointestinal:  Negative for blood in stool, constipation, diarrhea, nausea and vomiting.  Genitourinary:  Negative for dysuria, frequency and hematuria.  Musculoskeletal:  Negative for joint pain and myalgias.       Objective:    Physical Exam Constitutional:      General: He is not in acute distress.    Appearance: Normal appearance. He is not ill-appearing.  HENT:     Head: Normocephalic and atraumatic.     Right Ear: Tympanic membrane, ear canal and external ear normal.     Left Ear: Tympanic membrane, ear canal and external ear normal.     Nose: Nose normal.     Mouth/Throat:     Mouth: Mucous membranes are moist.     Pharynx: Oropharynx is clear.  Eyes:     Extraocular Movements: Extraocular movements intact.     Pupils: Pupils are equal, round, and reactive to light.  Cardiovascular:     Rate and Rhythm: Normal rate and regular rhythm.      Heart sounds: Normal heart sounds. No murmur heard.    No gallop.  Pulmonary:     Effort: Pulmonary effort is normal. No respiratory distress.     Breath sounds: Normal breath sounds. No wheezing or rales.  Abdominal:     General: Bowel sounds are normal. There is no distension.     Palpations: Abdomen is soft.     Tenderness: There is no abdominal tenderness.  There is no guarding.  Musculoskeletal:        General: Normal range of motion.     Cervical back: Normal range of motion.  Skin:    General: Skin is warm and dry.  Neurological:     General: No focal deficit present.     Mental Status: He is alert and oriented to person, place, and time.     Deep Tendon Reflexes:     Reflex Scores:      Patellar reflexes are 0 on the right side and 0 on the left side.    Comments: Noted to have prior surgery on his BLE.   Psychiatric:        Mood and Affect: Mood normal.        Behavior: Behavior normal.     BP 126/72 (BP Location: Right Arm, Patient Position: Sitting, Cuff Size: Normal)   Pulse 67   Temp 98 F (36.7 C) (Oral)   Resp 16   Ht '5\' 6"'$  (1.676 m)   Wt 158 lb (71.7 kg)   SpO2 95%   BMI 25.50 kg/m  Wt Readings from Last 3 Encounters:  12/31/22 158 lb (71.7 kg)  06/12/22 162 lb 9.6 oz (73.8 kg)  02/22/22 165 lb 9.6 oz (75.1 kg)    Diabetic Foot Exam - Simple   No data filed    Lab Results  Component Value Date   WBC 4.1 09/14/2022   HGB 15.1 09/14/2022   HCT 45.3 09/14/2022   PLT 189.0 09/14/2022   GLUCOSE 106 (H) 12/17/2022   CHOL 219 (H) 12/17/2022   TRIG 77.0 12/17/2022   HDL 61.10 12/17/2022   LDLCALC 143 (H) 12/17/2022   ALT 23 12/17/2022   AST 17 12/17/2022   NA 144 12/17/2022   K 3.9 12/17/2022   CL 99 12/17/2022   CREATININE 1.12 12/17/2022   BUN 18 12/17/2022   CO2 33 (H) 12/17/2022   TSH 0.91 06/12/2022   PSA 4.17 (H) 08/17/2019   HGBA1C 6.0 12/17/2022    Lab Results  Component Value Date   TSH 0.91 06/12/2022   Lab Results   Component Value Date   WBC 4.1 09/14/2022   HGB 15.1 09/14/2022   HCT 45.3 09/14/2022   MCV 89.7 09/14/2022   PLT 189.0 09/14/2022   Lab Results  Component Value Date   NA 144 12/17/2022   K 3.9 12/17/2022   CO2 33 (H) 12/17/2022   GLUCOSE 106 (H) 12/17/2022   BUN 18 12/17/2022   CREATININE 1.12 12/17/2022   BILITOT 0.5 12/17/2022   ALKPHOS 51 12/17/2022   AST 17 12/17/2022   ALT 23 12/17/2022   PROT 6.7 12/17/2022   ALBUMIN 4.0 12/17/2022   CALCIUM 9.3 12/17/2022   ANIONGAP 8 01/24/2022   GFR 69.89 12/17/2022   Lab Results  Component Value Date   CHOL 219 (H) 12/17/2022   Lab Results  Component Value Date   HDL 61.10 12/17/2022   Lab Results  Component Value Date   LDLCALC 143 (H) 12/17/2022   Lab Results  Component Value Date   TRIG 77.0 12/17/2022   Lab Results  Component Value Date   CHOLHDL 4 12/17/2022   Lab Results  Component Value Date   HGBA1C 6.0 12/17/2022       Assessment & Plan:   Problem List Items Addressed This Visit     Acid reflux    Nursing note reviewed. Agree with documention and plan.       Hyperglycemia -  Primary    hgba1c acceptable, minimize simple carbs. Increase exercise as tolerated.       Hyperlipemia    Encourage heart healthy diet such as MIND or DASH diet, increase exercise, avoid trans fats, simple carbohydrates and processed foods, consider a krill or fish or flaxseed oil cap daily.       Hypertension    Well controlled, no changes to meds. Encouraged heart healthy diet such as the DASH diet and exercise as tolerated.       Preventative health care    Patient encouraged to maintain heart healthy diet, regular exercise, adequate sleep. Consider daily probiotics. Take medications as prescribed. Labs reviewed. Given and reviewed copy of ACP documents from [facility] and encouraged to complete and return. Last colonoscopy 2022 repeat in 2032 for screening purposes        No orders of the defined  types were placed in this encounter.   I, Penni Homans, MD, personally preformed the services described in this documentation.  All medical record entries made by the scribe were at my direction and in my presence.  I have reviewed the chart and discharge instructions (if applicable) and agree that the record reflects my personal performance and is accurate and complete. 12/31/2022.  I,Mathew Stumpf,acting as a Education administrator for Penni Homans, MD.,have documented all relevant documentation on the behalf of Penni Homans, MD,as directed by  Penni Homans, MD while in the presence of Penni Homans, MD.   Penni Homans, MD

## 2023-01-01 NOTE — Addendum Note (Signed)
Addended by: Laure Kidney on: 01/01/2023 08:32 AM   Modules accepted: Orders

## 2023-02-03 ENCOUNTER — Other Ambulatory Visit: Payer: Self-pay | Admitting: Family Medicine

## 2023-03-04 ENCOUNTER — Other Ambulatory Visit: Payer: Self-pay | Admitting: Family Medicine

## 2023-06-30 NOTE — Assessment & Plan Note (Signed)
Well controlled, no changes to meds. Encouraged heart healthy diet such as the DASH diet and exercise as tolerated.  °

## 2023-06-30 NOTE — Assessment & Plan Note (Signed)
hgba1c acceptable, minimize simple carbs. Increase exercise as tolerated.  

## 2023-06-30 NOTE — Assessment & Plan Note (Signed)
Encourage heart healthy diet such as MIND or DASH diet, increase exercise, avoid trans fats, simple carbohydrates and processed foods, consider a krill or fish or flaxseed oil cap daily.  °

## 2023-06-30 NOTE — Assessment & Plan Note (Signed)
Avoid offending foods, start probiotics. Do not eat large meals in late evening and consider raising head of bed.  

## 2023-06-30 NOTE — Assessment & Plan Note (Signed)
Continue to monitor

## 2023-07-01 ENCOUNTER — Ambulatory Visit (INDEPENDENT_AMBULATORY_CARE_PROVIDER_SITE_OTHER): Payer: 59 | Admitting: Family Medicine

## 2023-07-01 VITALS — BP 132/78 | HR 77 | Temp 98.0°F | Resp 16 | Ht 66.0 in | Wt 161.0 lb

## 2023-07-01 DIAGNOSIS — E782 Mixed hyperlipidemia: Secondary | ICD-10-CM

## 2023-07-01 DIAGNOSIS — I10 Essential (primary) hypertension: Secondary | ICD-10-CM | POA: Diagnosis not present

## 2023-07-01 DIAGNOSIS — D72819 Decreased white blood cell count, unspecified: Secondary | ICD-10-CM | POA: Diagnosis not present

## 2023-07-01 DIAGNOSIS — M79621 Pain in right upper arm: Secondary | ICD-10-CM

## 2023-07-01 DIAGNOSIS — R739 Hyperglycemia, unspecified: Secondary | ICD-10-CM

## 2023-07-01 DIAGNOSIS — K219 Gastro-esophageal reflux disease without esophagitis: Secondary | ICD-10-CM

## 2023-07-01 LAB — COMPREHENSIVE METABOLIC PANEL
ALT: 19 U/L (ref 0–53)
AST: 16 U/L (ref 0–37)
Albumin: 4.2 g/dL (ref 3.5–5.2)
Alkaline Phosphatase: 51 U/L (ref 39–117)
BUN: 19 mg/dL (ref 6–23)
CO2: 29 mEq/L (ref 19–32)
Calcium: 9.7 mg/dL (ref 8.4–10.5)
Chloride: 101 mEq/L (ref 96–112)
Creatinine, Ser: 1.11 mg/dL (ref 0.40–1.50)
GFR: 70.38 mL/min (ref >60.00)
Glucose, Bld: 98 mg/dL (ref 70–99)
Potassium: 3.9 mEq/L (ref 3.5–5.1)
Sodium: 141 mEq/L (ref 135–145)
Total Bilirubin: 0.5 mg/dL (ref 0.2–1.2)
Total Protein: 6.8 g/dL (ref 6.0–8.3)

## 2023-07-01 LAB — CBC WITH DIFFERENTIAL/PLATELET
Basophils Absolute: 0 10*3/uL (ref 0.0–0.1)
Basophils Relative: 0.9 % (ref 0.0–3.0)
Eosinophils Absolute: 0.2 10*3/uL (ref 0.0–0.7)
Eosinophils Relative: 4.2 % (ref 0.0–5.0)
HCT: 46.5 % (ref 39.0–52.0)
Hemoglobin: 15.4 g/dL (ref 13.0–17.0)
Lymphocytes Relative: 38.4 % (ref 12.0–46.0)
Lymphs Abs: 1.5 10*3/uL (ref 0.7–4.0)
MCHC: 33 g/dL (ref 30.0–36.0)
MCV: 89 fl (ref 78.0–100.0)
Monocytes Absolute: 0.5 10*3/uL (ref 0.1–1.0)
Monocytes Relative: 11.4 % (ref 3.0–12.0)
Neutro Abs: 1.8 10*3/uL (ref 1.4–7.7)
Neutrophils Relative %: 45.1 % (ref 43.0–77.0)
Platelets: 202 10*3/uL (ref 150.0–400.0)
RBC: 5.23 Mil/uL (ref 4.22–5.81)
RDW: 14.9 % (ref 11.5–15.5)
WBC: 4 10*3/uL (ref 4.0–10.5)

## 2023-07-01 LAB — LIPID PANEL
Cholesterol: 232 mg/dL — ABNORMAL HIGH (ref 0–200)
HDL: 62.9 mg/dL (ref >39.00)
LDL Cholesterol: 156 mg/dL — ABNORMAL HIGH (ref 0–99)
NonHDL: 168.87
Total CHOL/HDL Ratio: 4
Triglycerides: 65 mg/dL (ref 0.0–149.0)
VLDL: 13 mg/dL (ref 0.0–40.0)

## 2023-07-01 LAB — HEMOGLOBIN A1C: Hgb A1c MFr Bld: 5.8 % (ref 4.6–6.5)

## 2023-07-01 LAB — TSH: TSH: 1.95 u[IU]/mL (ref 0.35–5.50)

## 2023-07-01 NOTE — Patient Instructions (Addendum)
Consider Voltaren, Lidocaine gel and/or biofreeze and alternate ice and heatHypertension, Adult High blood pressure (hypertension) is when the force of blood pumping through the arteries is too strong. The arteries are the blood vessels that carry blood from the heart throughout the body. Hypertension forces the heart to work harder to pump blood and may cause arteries to become narrow or stiff. Untreated or uncontrolled hypertension can lead to a heart attack, heart failure, a stroke, kidney disease, and other problems. A blood pressure reading consists of a higher number over a lower number. Ideally, your blood pressure should be below 120/80. The first ("top") number is called the systolic pressure. It is a measure of the pressure in your arteries as your heart beats. The second ("bottom") number is called the diastolic pressure. It is a measure of the pressure in your arteries as the heart relaxes. What are the causes? The exact cause of this condition is not known. There are some conditions that result in high blood pressure. What increases the risk? Certain factors may make you more likely to develop high blood pressure. Some of these risk factors are under your control, including: Smoking. Not getting enough exercise or physical activity. Being overweight. Having too much fat, sugar, calories, or salt (sodium) in your diet. Drinking too much alcohol. Other risk factors include: Having a personal history of heart disease, diabetes, high cholesterol, or kidney disease. Stress. Having a family history of high blood pressure and high cholesterol. Having obstructive sleep apnea. Age. The risk increases with age. What are the signs or symptoms? High blood pressure may not cause symptoms. Very high blood pressure (hypertensive crisis) may cause: Headache. Fast or irregular heartbeats (palpitations). Shortness of breath. Nosebleed. Nausea and vomiting. Vision changes. Severe chest pain,  dizziness, and seizures. How is this diagnosed? This condition is diagnosed by measuring your blood pressure while you are seated, with your arm resting on a flat surface, your legs uncrossed, and your feet flat on the floor. The cuff of the blood pressure monitor will be placed directly against the skin of your upper arm at the level of your heart. Blood pressure should be measured at least twice using the same arm. Certain conditions can cause a difference in blood pressure between your right and left arms. If you have a high blood pressure reading during one visit or you have normal blood pressure with other risk factors, you may be asked to: Return on a different day to have your blood pressure checked again. Monitor your blood pressure at home for 1 week or longer. If you are diagnosed with hypertension, you may have other blood or imaging tests to help your health care provider understand your overall risk for other conditions. How is this treated? This condition is treated by making healthy lifestyle changes, such as eating healthy foods, exercising more, and reducing your alcohol intake. You may be referred for counseling on a healthy diet and physical activity. Your health care provider may prescribe medicine if lifestyle changes are not enough to get your blood pressure under control and if: Your systolic blood pressure is above 130. Your diastolic blood pressure is above 80. Your personal target blood pressure may vary depending on your medical conditions, your age, and other factors. Follow these instructions at home: Eating and drinking  Eat a diet that is high in fiber and potassium, and low in sodium, added sugar, and fat. An example of this eating plan is called the DASH diet. DASH stands for  Dietary Approaches to Stop Hypertension. To eat this way: Eat plenty of fresh fruits and vegetables. Try to fill one half of your plate at each meal with fruits and vegetables. Eat whole  grains, such as whole-wheat pasta, brown rice, or whole-grain bread. Fill about one fourth of your plate with whole grains. Eat or drink low-fat dairy products, such as skim milk or low-fat yogurt. Avoid fatty cuts of meat, processed or cured meats, and poultry with skin. Fill about one fourth of your plate with lean proteins, such as fish, chicken without skin, beans, eggs, or tofu. Avoid pre-made and processed foods. These tend to be higher in sodium, added sugar, and fat. Reduce your daily sodium intake. Many people with hypertension should eat less than 1,500 mg of sodium a day. Do not drink alcohol if: Your health care provider tells you not to drink. You are pregnant, may be pregnant, or are planning to become pregnant. If you drink alcohol: Limit how much you have to: 0-1 drink a day for women. 0-2 drinks a day for men. Know how much alcohol is in your drink. In the [place] one drink equals one 12 oz bottle of beer (355 mL), one 5 oz glass of wine (148 mL), or one 1 oz glass of hard liquor (44 mL). Lifestyle  Work with your health care provider to maintain a healthy body weight or to lose weight. Ask what an ideal weight is for you. Get at least 30 minutes of exercise that causes your heart to beat faster (aerobic exercise) most days of the week. Activities may include walking, swimming, or biking. Include exercise to strengthen your muscles (resistance exercise), such as Pilates or lifting weights, as part of your weekly exercise routine. Try to do these types of exercises for 30 minutes at least 3 days a week. Do not use any products that contain nicotine or tobacco. These products include cigarettes, chewing tobacco, and vaping devices, such as e-cigarettes. If you need help quitting, ask your health care provider. Monitor your blood pressure at home as told by your health care provider. Keep all follow-up visits. This is important. Medicines Take over-the-counter and prescription  medicines only as told by your health care provider. Follow directions carefully. Blood pressure medicines must be taken as prescribed. Do not skip doses of blood pressure medicine. Doing this puts you at risk for problems and can make the medicine less effective. Ask your health care provider about side effects or reactions to medicines that you should watch for. Contact a health care provider if you: Think you are having a reaction to a medicine you are taking. Have headaches that keep coming back (recurring). Feel dizzy. Have swelling in your ankles. Have trouble with your vision. Get help right away if you: Develop a severe headache or confusion. Have unusual weakness or numbness. Feel faint. Have severe pain in your chest or abdomen. Vomit repeatedly. Have trouble breathing. These symptoms may be an emergency. Get help right away. Call 911. Do not wait to see if the symptoms will go away. Do not drive yourself to the hospital. Summary Hypertension is when the force of blood pumping through your arteries is too strong. If this condition is not controlled, it may put you at risk for serious complications. Your personal target blood pressure may vary depending on your medical conditions, your age, and other factors. For most people, a normal blood pressure is less than 120/80. Hypertension is treated with lifestyle changes, medicines, or a combination  of both. Lifestyle changes include losing weight, eating a healthy, low-sodium diet, exercising more, and limiting alcohol. This information is not intended to replace advice given to you by your health care provider. Make sure you discuss any questions you have with your health care provider. Document Revised: 09/19/2021 Document Reviewed: 09/19/2021 Elsevier Patient Education  2024 [employer].

## 2023-07-01 NOTE — Progress Notes (Signed)
Subjective:    [name], male    DOB: Feb 22, 1961, 62 y.o.   MRN: [id number]  Chief Complaint  Patient presents with   Follow-up    Follow up    HPI Discussed the use of AI scribe software for clinical note transcription with the patient, who gave verbal consent to proceed.  History of Present Illness   The patient, who is left-handed, has been experiencing persistent pain in his right arm for about a month. The pain is constant and increases with certain movements, particularly when the arm is extended outward. The pain is not severe, rating it as less than a nine on a scale of one to ten, but it is consistently present. The patient denies any known injury, heavy lifting, or unusual activity that might have triggered the onset of the pain. The pain is localized to the right arm and does not extend to the shoulder, hand, or neck. The patient sleeps on his stomach and does not believe his sleeping position is contributing to the pain. The patient runs a small business on Saturdays but does not believe this activity is related to the arm pain. The patient noted a discrepancy in his recorded weight during the visit, stating that his weight is 161 pounds.        Past Medical History:  Diagnosis Date   Allergy    Constipation 12/25/2015   Dysrhythmia    sinus arrythmia-2006-work up negative findings   Hyperglycemia 03/15/2015   Hyperlipemia    Hypertension    Leg pain, diffuse, right 04/15/2017   Preventative health care 03/20/2015   Sees Dr Loreta Ave of gastroenterology    Past Surgical History:  Procedure Laterality Date   KNEE ARTHROSCOPY  2009   rt knee   KNEE ARTHROSCOPY  01/16/2012   Procedure: ARTHROSCOPY KNEE;  Surgeon: Loanne Drilling, MD;  Location: [facility];  Service: Orthopedics;  Laterality: Left;  WITH DEBRIDEMENT   VENTRAL HERNIA REPAIR      Family History  Problem Relation Age of Onset   Hypertension Mother    Diabetes Mother         labile   Kidney disease Mother        ESRD   Peripheral vascular disease Mother    Heart disease Father        MI?   Learning disabilities Daughter    Drug abuse Son    Diabetes Maternal Aunt    Heart disease Maternal Aunt    Heart disease Cousin    Diabetes Cousin    Alcohol abuse Cousin     Social History   Socioeconomic History   Marital status: Married    Spouse name: Delora   Number of children: Not on file   Years of education: 12   Highest education level: Not on file  Occupational History   Occupation: Truck Hospital doctor for [employer]  Tobacco Use   Smoking status: Never   Smokeless tobacco: Never  Vaping Use   Vaping status: Never Used  Substance and Sexual Activity   Alcohol use: No   Drug use: No   Sexual activity: Yes    Comment: lives with wife, no major dietary restrictions, works for [employer]  Other Topics Concern   Not on file  Social History Narrative   Not on file   Social Determinants of Health   Financial Resource Strain: Not on file  Food Insecurity: Not on file  Transportation Needs: Not on file  Physical Activity: Not on file  Stress: Not on file  Social Connections: Unknown (04/06/2022)   Received from [facility], [facility]   Social Network    Social Network: Not on file  Intimate Partner Violence: Unknown (02/26/2022)   [facility], [facility]   HITS    Physically Hurt: Not on file    Insult or Talk Down To: Not on file    Threaten Physical Harm: Not on file    Scream or Curse: Not on file    Outpatient Medications Prior to Visit  Medication Sig Dispense Refill   ASPIRIN 81 PO Take by mouth daily.     Cholecalciferol (D3 PO) Take by mouth daily.     COD LIVER OIL PO Take by mouth daily.     fish oil-omega-3 fatty acids 1000 MG capsule Take 2 g by mouth daily.     Garlic 10 MG CAPS Take by mouth.     hydrochlorothiazide (HYDRODIURIL) 25 MG tablet Take 1 tablet by mouth once daily 90 tablet 1   lisinopril  (ZESTRIL) 10 MG tablet Take 1 tablet by mouth once daily 90 tablet 1   Multiple Vitamin (MULTIVITAMIN) tablet Take 1 tablet by mouth daily.     Red Yeast Rice 600 MG CAPS Take 1 tablet by mouth daily.     ZINC CITRATE PO Take by mouth daily.     No facility-administered medications prior to visit.    No Known Allergies  Review of Systems  Constitutional:  Negative for fever and malaise/fatigue.  HENT:  Negative for congestion.   Eyes:  Negative for blurred vision.  Respiratory:  Negative for shortness of breath.   Cardiovascular:  Negative for chest pain, palpitations and leg swelling.  Gastrointestinal:  Negative for abdominal pain, blood in stool and nausea.  Genitourinary:  Negative for dysuria and frequency.  Musculoskeletal:  Positive for joint pain and myalgias. Negative for falls.  Skin:  Negative for rash.  Neurological:  Negative for dizziness, loss of consciousness and headaches.  Endo/Heme/Allergies:  Negative for environmental allergies.  Psychiatric/Behavioral:  Negative for depression. The patient is not nervous/anxious.        Objective:    Physical Exam Vitals reviewed.  Constitutional:      Appearance: Normal appearance. He is not ill-appearing.  HENT:     Head: Normocephalic and atraumatic.     Nose: Nose normal.  Eyes:     Conjunctiva/sclera: Conjunctivae normal.  Cardiovascular:     Rate and Rhythm: Normal rate.     Pulses: Normal pulses.     Heart sounds: Normal heart sounds. No murmur heard. Pulmonary:     Effort: Pulmonary effort is normal.     Breath sounds: Normal breath sounds. No wheezing.  Abdominal:     Palpations: Abdomen is soft. There is no mass.     Tenderness: There is no abdominal tenderness.  Musculoskeletal:     Cervical back: Normal range of motion.     Right lower leg: No edema.     Left lower leg: No edema.  Skin:    General: Skin is warm and dry.  Neurological:     General: No focal deficit present.     Mental Status: He  is alert and oriented to person, place, and time.  Psychiatric:        Mood and Affect: Mood normal.     BP 132/78 (BP Location: Left Arm, Patient Position: Sitting, Cuff Size: Normal)   Pulse 77  Temp 98 F (36.7 C) (Oral)   Resp 16   Ht 5\' 6"  (1.676 m)   Wt 161 lb (73 kg)   SpO2 97%   BMI 25.99 kg/m  Wt Readings from Last 3 Encounters:  07/01/23 161 lb (73 kg)  12/31/22 158 lb (71.7 kg)  06/12/22 162 lb 9.6 oz (73.8 kg)    Diabetic Foot Exam - Simple   No data filed    Lab Results  Component Value Date   WBC 4.1 09/14/2022   HGB 15.1 09/14/2022   HCT 45.3 09/14/2022   PLT 189.0 09/14/2022   GLUCOSE 106 (H) 12/17/2022   CHOL 219 (H) 12/17/2022   TRIG 77.0 12/17/2022   HDL 61.10 12/17/2022   LDLCALC 143 (H) 12/17/2022   ALT 23 12/17/2022   AST 17 12/17/2022   NA 144 12/17/2022   K 3.9 12/17/2022   CL 99 12/17/2022   CREATININE 1.12 12/17/2022   BUN 18 12/17/2022   CO2 33 (H) 12/17/2022   TSH 0.91 06/12/2022   PSA 4.17 (H) 08/17/2019   HGBA1C 6.0 12/17/2022    Lab Results  Component Value Date   TSH 0.91 06/12/2022   Lab Results  Component Value Date   WBC 4.1 09/14/2022   HGB 15.1 09/14/2022   HCT 45.3 09/14/2022   MCV 89.7 09/14/2022   PLT 189.0 09/14/2022   Lab Results  Component Value Date   NA 144 12/17/2022   K 3.9 12/17/2022   CO2 33 (H) 12/17/2022   GLUCOSE 106 (H) 12/17/2022   BUN 18 12/17/2022   CREATININE 1.12 12/17/2022   BILITOT 0.5 12/17/2022   ALKPHOS 51 12/17/2022   AST 17 12/17/2022   ALT 23 12/17/2022   PROT 6.7 12/17/2022   ALBUMIN 4.0 12/17/2022   CALCIUM 9.3 12/17/2022   ANIONGAP 8 01/24/2022   GFR 69.89 12/17/2022   Lab Results  Component Value Date   CHOL 219 (H) 12/17/2022   Lab Results  Component Value Date   HDL 61.10 12/17/2022   Lab Results  Component Value Date   LDLCALC 143 (H) 12/17/2022   Lab Results  Component Value Date   TRIG 77.0 12/17/2022   Lab Results  Component Value Date    CHOLHDL 4 12/17/2022   Lab Results  Component Value Date   HGBA1C 6.0 12/17/2022       Assessment & Plan:  Hyperglycemia Assessment & Plan: hgba1c acceptable, minimize simple carbs. Increase exercise as tolerated.   Orders: -     Lipid panel -     Hemoglobin A1c  Moderate mixed hyperlipidemia not requiring statin therapy Assessment & Plan: Encourage heart healthy diet such as MIND or DASH diet, increase exercise, avoid trans fats, simple carbohydrates and processed foods, consider a krill or fish or flaxseed oil cap daily.    Primary hypertension Assessment & Plan: Well controlled, no changes to meds. Encouraged heart healthy diet such as the DASH diet and exercise as tolerated.   Orders: -     Lipid panel -     Comprehensive metabolic panel -     CBC with Differential/Platelet -     TSH  Leukopenia, unspecified type Assessment & Plan: Continue to monitor   Gastroesophageal reflux disease, unspecified whether esophagitis present Assessment & Plan: Avoid offending foods, start probiotics. Do not eat large meals in late evening and consider raising head of bed.     Pain in right upper arm Assessment & Plan: 1 month slowly improving no trigger, worse  with abduction. Try topical voltaren and lidocaine and alternate ice and heat. Consider sports med if does not improve     Assessment and Plan    Right Arm Pain Pain localized to the right arm, present for about a month, worsens with certain movements. No known injury or trigger. No associated weakness, neck pain, or shoulder pain. -Try over-the-counter topical treatments such as Voltaren, lidocaine gel, or Biofreeze. -Alternate ice and heat for relief. -Consider sports medicine referral if pain does not resolve.  General Health Maintenance Discussed the importance of flu and COVID-19 vaccinations, especially considering the upcoming flu season. -Patient to consider getting flu and COVID-19 booster shots in the  fall.  Lab Work Plan to conduct routine lab work to [facility]. -Order routine lab work.  Follow-up Plan for a 58-month to annual follow-up unless new issues arise. -Schedule follow-up appointment in 6 months to 1 year.         Danise Edge, MD

## 2023-07-01 NOTE — Assessment & Plan Note (Signed)
1 month slowly improving no trigger, worse with abduction. Try topical voltaren and lidocaine and alternate ice and heat. Consider sports med if does not improve

## 2023-07-02 ENCOUNTER — Other Ambulatory Visit: Payer: Self-pay

## 2023-07-02 DIAGNOSIS — R739 Hyperglycemia, unspecified: Secondary | ICD-10-CM

## 2023-07-02 DIAGNOSIS — I10 Essential (primary) hypertension: Secondary | ICD-10-CM

## 2023-07-02 DIAGNOSIS — E785 Hyperlipidemia, unspecified: Secondary | ICD-10-CM

## 2023-08-04 ENCOUNTER — Other Ambulatory Visit: Payer: Self-pay | Admitting: Family Medicine

## 2023-08-30 ENCOUNTER — Other Ambulatory Visit: Payer: Self-pay | Admitting: Family Medicine

## 2023-11-01 ENCOUNTER — Other Ambulatory Visit: Payer: Self-pay | Admitting: Family Medicine

## 2023-12-04 ENCOUNTER — Encounter: Payer: Self-pay | Admitting: Physician Assistant

## 2023-12-04 ENCOUNTER — Ambulatory Visit: Payer: 59 | Admitting: Physician Assistant

## 2023-12-04 VITALS — BP 153/88 | HR 76 | Temp 98.3°F | Ht 66.0 in | Wt 166.1 lb

## 2023-12-04 DIAGNOSIS — J069 Acute upper respiratory infection, unspecified: Secondary | ICD-10-CM | POA: Diagnosis not present

## 2023-12-04 DIAGNOSIS — R051 Acute cough: Secondary | ICD-10-CM

## 2023-12-04 LAB — POC COVID19 BINAXNOW: SARS Coronavirus 2 Ag: NEGATIVE

## 2023-12-04 MED ORDER — BENZONATATE 100 MG PO CAPS
100.0000 mg | ORAL_CAPSULE | Freq: Two times a day (BID) | ORAL | 0 refills | Status: DC | PRN
Start: 2023-12-04 — End: 2024-08-21

## 2023-12-04 MED ORDER — AZELASTINE HCL 0.1 % NA SOLN: 1.0000 | Freq: Two times a day (BID) | NASAL | 0 refills | Status: DC

## 2023-12-04 NOTE — Progress Notes (Signed)
 Established patient visit   Patient: Tommy Farrell   DOB: 1961-06-21   63 y.o. Male  MRN: [id number] Visit Date: 12/04/2023  Today's healthcare provider: Manuelita Flatness, PA-C   Chief Complaint  Patient presents with   URI    Present since Sunday night. Cough, sinus pressure, stuffy nose. Headaches. No fever that he knows of   Subjective     Pt reports sore throat, cough, sinus pressure, stuffy nose, headache starting Sunday, x 4 days. He denies fever, chest pain, shortness of breath.  Not taking anything over the counter. Reports a negative COVID test at home Sunday night.  Medications: Outpatient Medications Prior to Visit  Medication Sig   ASPIRIN 81 PO Take by mouth daily.   Cholecalciferol (D3 PO) Take by mouth daily.   COD LIVER OIL PO Take by mouth daily.   fish oil-omega-3 fatty acids 1000 MG capsule Take 2 g by mouth daily.   Garlic 10 MG CAPS Take by mouth.   hydrochlorothiazide  (HYDRODIURIL ) 25 MG tablet Take 1 tablet by mouth once daily   lisinopril  (ZESTRIL ) 10 MG tablet Take 1 tablet (10 mg total) by mouth daily.   Multiple Vitamin (MULTIVITAMIN) tablet Take 1 tablet by mouth daily.   Red Yeast Rice 600 MG CAPS Take 1 tablet by mouth daily.   ZINC CITRATE PO Take by mouth daily.   No facility-administered medications prior to visit.    Review of Systems  Constitutional:  Negative for fatigue and fever.  HENT:  Positive for congestion and rhinorrhea.   Respiratory:  Positive for cough. Negative for shortness of breath.   Cardiovascular:  Negative for chest pain, palpitations and leg swelling.  Neurological:  Positive for headaches. Negative for dizziness.       Objective    BP (!) 153/88   Pulse 76   Temp 98.3 F (36.8 C) (Oral)   Ht 5' 6 (1.676 m)   Wt 166 lb 2 oz (75.4 kg)   SpO2 98%   BMI 26.81 kg/m    Physical Exam Constitutional:      General: He is awake.     Appearance: He is well-developed.  HENT:     Head: Normocephalic.      Right Ear: Tympanic membrane normal.     Left Ear: Tympanic membrane normal.     Mouth/Throat:     Pharynx: Posterior oropharyngeal erythema present. No oropharyngeal exudate.  Eyes:     Conjunctiva/sclera: Conjunctivae normal.  Cardiovascular:     Rate and Rhythm: Normal rate and regular rhythm.     Heart sounds: Normal heart sounds.  Pulmonary:     Effort: Pulmonary effort is normal.     Breath sounds: Normal breath sounds. No stridor. No wheezing or rales.  Skin:    General: Skin is warm.  Neurological:     Mental Status: He is alert and oriented to person, place, and time.  Psychiatric:        Attention and Perception: Attention normal.        Mood and Affect: Mood normal.        Speech: Speech normal.        Behavior: Behavior is cooperative.     Results for orders placed or performed in visit on 12/04/23  POC COVID-19  Result Value Ref Range   SARS Coronavirus 2 Ag Negative Negative    Assessment & Plan    Upper respiratory tract infection, unspecified type -     Benzonatate ;  Take 1 capsule (100 mg total) by mouth 2 (two) times daily as needed for cough.  Dispense: 20 capsule; Refill: 0  Acute cough -     POC COVID-19 BinaxNow -     Azelastine  HCl; Place 1 spray into both nostrils 2 (two) times daily. Use in each nostril as directed  Dispense: 30 mL; Refill: 0   Repeat poc covid test negative.  Explained viral vs bacterial illnesses. Recommending hydration, rest, rx tessalon , azelastine  If symptoms continue for 7-10 days advised pt contact office for further recommendations  Return if symptoms worsen or fail to improve.       Manuelita Flatness, PA-C  [facility] at [facility] [phone] (phone) [phone] (fax)  [facility]

## 2023-12-27 ENCOUNTER — Encounter: Payer: Self-pay | Admitting: Physician Assistant

## 2023-12-27 ENCOUNTER — Ambulatory Visit (INDEPENDENT_AMBULATORY_CARE_PROVIDER_SITE_OTHER): Payer: 59 | Admitting: Physician Assistant

## 2023-12-27 VITALS — BP 126/87 | HR 73 | Temp 98.1°F | Ht 66.0 in | Wt 161.5 lb

## 2023-12-27 DIAGNOSIS — J209 Acute bronchitis, unspecified: Secondary | ICD-10-CM

## 2023-12-27 MED ORDER — DOXYCYCLINE HYCLATE 100 MG PO TABS: 100.0000 mg | ORAL_TABLET | Freq: Two times a day (BID) | ORAL | 0 refills | Status: AC

## 2023-12-27 NOTE — Progress Notes (Signed)
Established patient visit   Patient: Tommy Farrell   DOB: [birth year]   63 y.o. Male  MRN: [id number] Visit Date: 12/27/2023  Today's healthcare provider: Alfredia Ferguson, PA-C   Chief Complaint  Patient presents with   URI    Patient seen 1/08, states he hasn't felt better. Medicine didn't work.    Subjective     Pt was seen by myself 12/04/23 for an upper respiratory infection, pt reports persistent symptoms since then.  He reports overall symptoms have improved, but persistent upper back pain and occasional cough. Still feeling fatigued.  Medications: Outpatient Medications Prior to Visit  Medication Sig   ASPIRIN 81 PO Take by mouth daily.   azelastine (ASTELIN) 0.1 % nasal spray Place 1 spray into both nostrils 2 (two) times daily. Use in each nostril as directed   benzonatate (TESSALON) 100 MG capsule Take 1 capsule (100 mg total) by mouth 2 (two) times daily as needed for cough.   Cholecalciferol (D3 PO) Take by mouth daily.   COD LIVER OIL PO Take by mouth daily.   fish oil-omega-3 fatty acids 1000 MG capsule Take 2 g by mouth daily.   Garlic 10 MG CAPS Take by mouth.   hydrochlorothiazide (HYDRODIURIL) 25 MG tablet Take 1 tablet by mouth once daily   lisinopril (ZESTRIL) 10 MG tablet Take 1 tablet (10 mg total) by mouth daily.   Multiple Vitamin (MULTIVITAMIN) tablet Take 1 tablet by mouth daily.   Red Yeast Rice 600 MG CAPS Take 1 tablet by mouth daily.   ZINC CITRATE PO Take by mouth daily.   No facility-administered medications prior to visit.    Review of Systems  Constitutional:  Negative for fatigue and fever.  Respiratory:  Positive for cough. Negative for shortness of breath.   Cardiovascular:  Negative for chest pain, palpitations and leg swelling.  Musculoskeletal:  Positive for back pain.  Neurological:  Negative for dizziness and headaches.       Objective    BP 126/87   Pulse 73   Temp 98.1 F (36.7 C) (Oral)   Ht 5\' 6"  (1.676 m)   Wt  161 lb 8 oz (73.3 kg)   SpO2 98%   BMI 26.07 kg/m    Physical Exam Constitutional:      General: He is awake.     Appearance: He is well-developed.  HENT:     Head: Normocephalic.  Eyes:     Conjunctiva/sclera: Conjunctivae normal.  Cardiovascular:     Rate and Rhythm: Normal rate and regular rhythm.     Heart sounds: Normal heart sounds.  Pulmonary:     Effort: Pulmonary effort is normal.     Breath sounds: Normal breath sounds.  Skin:    General: Skin is warm.  Neurological:     Mental Status: He is alert and oriented to person, place, and time.  Psychiatric:        Attention and Perception: Attention normal.        Mood and Affect: Mood normal.        Speech: Speech normal.        Behavior: Behavior is cooperative.     No results found for any visits on 12/27/23.  Assessment & Plan    Acute bronchitis, unspecified organism -     Doxycycline Hyclate; Take 1 tablet (100 mg total) by mouth 2 (two) times daily for 7 days.  Dispense: 14 tablet; Refill: 0   Hydrate, rest, rx doxy  bid x 7 days  Return if symptoms worsen or fail to improve.       Alfredia Ferguson, PA-C  [facility] at [facility] [phone] (phone) [phone] (fax)  [facility]

## 2024-01-19 NOTE — Assessment & Plan Note (Signed)
 Patient encouraged to maintain heart healthy diet, regular exercise, adequate sleep. Consider daily probiotics. Take medications as prescribed. Labs reviewed. Given and reviewed copy of ACP documents from [employer] and encouraged to complete and return. Last colonoscopy 2022 repeat in 2032 for screening purposes

## 2024-01-19 NOTE — Assessment & Plan Note (Signed)
 Well controlled, no changes to meds. Encouraged heart healthy diet such as the DASH diet and exercise as tolerated.

## 2024-01-19 NOTE — Assessment & Plan Note (Signed)
 hgba1c acceptable, minimize simple carbs. Increase exercise as tolerated.

## 2024-01-19 NOTE — Assessment & Plan Note (Signed)
 Encourage heart healthy diet such as MIND or DASH diet, increase exercise, avoid trans fats, simple carbohydrates and processed foods, consider a krill or fish or flaxseed oil cap daily.

## 2024-01-20 ENCOUNTER — Ambulatory Visit (INDEPENDENT_AMBULATORY_CARE_PROVIDER_SITE_OTHER): Payer: 59 | Admitting: Family Medicine

## 2024-01-20 ENCOUNTER — Encounter: Payer: Self-pay | Admitting: Family Medicine

## 2024-01-20 ENCOUNTER — Ambulatory Visit (HOSPITAL_BASED_OUTPATIENT_CLINIC_OR_DEPARTMENT_OTHER)
Admission: RE | Admit: 2024-01-20 | Discharge: 2024-01-20 | Disposition: A | Discharge status: Home / Self Care | Payer: 59 | Source: Ambulatory Visit | Attending: Family Medicine | Admitting: Family Medicine

## 2024-01-20 VITALS — BP 128/78 | HR 72 | Temp 98.3°F | Resp 18 | Ht 66.0 in | Wt 165.6 lb

## 2024-01-20 DIAGNOSIS — I10 Essential (primary) hypertension: Secondary | ICD-10-CM | POA: Diagnosis not present

## 2024-01-20 DIAGNOSIS — Z0000 Encounter for general adult medical examination without abnormal findings: Secondary | ICD-10-CM | POA: Diagnosis not present

## 2024-01-20 DIAGNOSIS — E782 Mixed hyperlipidemia: Secondary | ICD-10-CM | POA: Diagnosis not present

## 2024-01-20 DIAGNOSIS — M79622 Pain in left upper arm: Secondary | ICD-10-CM

## 2024-01-20 DIAGNOSIS — R739 Hyperglycemia, unspecified: Secondary | ICD-10-CM | POA: Diagnosis not present

## 2024-01-20 LAB — LIPID PANEL
Cholesterol: 225 mg/dL — ABNORMAL HIGH (ref 0–200)
HDL: 60.6 mg/dL (ref >39.00)
LDL Cholesterol: 149 mg/dL — ABNORMAL HIGH (ref 0–99)
NonHDL: 164.76
Total CHOL/HDL Ratio: 4
Triglycerides: 80 mg/dL (ref 0.0–149.0)
VLDL: 16 mg/dL (ref 0.0–40.0)

## 2024-01-20 LAB — CBC WITH DIFFERENTIAL/PLATELET
Basophils Absolute: 0 10*3/uL (ref 0.0–0.1)
Basophils Relative: 0.7 % (ref 0.0–3.0)
Eosinophils Absolute: 0.1 10*3/uL (ref 0.0–0.7)
Eosinophils Relative: 2.2 % (ref 0.0–5.0)
HCT: 45.6 % (ref 39.0–52.0)
Hemoglobin: 15.3 g/dL (ref 13.0–17.0)
Lymphocytes Relative: 40.6 % (ref 12.0–46.0)
Lymphs Abs: 1.4 10*3/uL (ref 0.7–4.0)
MCHC: 33.5 g/dL (ref 30.0–36.0)
MCV: 89.3 fl (ref 78.0–100.0)
Monocytes Absolute: 0.4 10*3/uL (ref 0.1–1.0)
Monocytes Relative: 11.1 % (ref 3.0–12.0)
Neutro Abs: 1.6 10*3/uL (ref 1.4–7.7)
Neutrophils Relative %: 45.4 % (ref 43.0–77.0)
Platelets: 183 10*3/uL (ref 150.0–400.0)
RBC: 5.1 Mil/uL (ref 4.22–5.81)
RDW: 15.1 % (ref 11.5–15.5)
WBC: 3.5 10*3/uL — ABNORMAL LOW (ref 4.0–10.5)

## 2024-01-20 LAB — COMPREHENSIVE METABOLIC PANEL
ALT: 20 U/L (ref 0–53)
AST: 19 U/L (ref 0–37)
Albumin: 4.2 g/dL (ref 3.5–5.2)
Alkaline Phosphatase: 44 U/L (ref 39–117)
BUN: 17 mg/dL (ref 6–23)
CO2: 33 meq/L — ABNORMAL HIGH (ref 19–32)
Calcium: 9.6 mg/dL (ref 8.4–10.5)
Chloride: 100 meq/L (ref 96–112)
Creatinine, Ser: 1.12 mg/dL (ref 0.40–1.50)
GFR: 69.35 mL/min (ref >60.00)
Glucose, Bld: 96 mg/dL (ref 70–99)
Potassium: 3.8 meq/L (ref 3.5–5.1)
Sodium: 140 meq/L (ref 135–145)
Total Bilirubin: 0.7 mg/dL (ref 0.2–1.2)
Total Protein: 6.9 g/dL (ref 6.0–8.3)

## 2024-01-20 LAB — TSH: TSH: 1.05 u[IU]/mL (ref 0.35–5.50)

## 2024-01-20 LAB — HEMOGLOBIN A1C: Hgb A1c MFr Bld: 6 % (ref 4.6–6.5)

## 2024-01-20 NOTE — Patient Instructions (Addendum)
 Prevnar 20 once at 63   RSV, Respiratory Syncitial VirusVaccine, Arexvy at pharmacy  Covid and flu booster   Netflix Live to 100 the secrets of the [facility] Zones   Preventive Care 43-65 Years Old, Male Preventive care refers to lifestyle choices and visits with your health care provider that can promote health and wellness. Preventive care visits are also called wellness exams. What can I expect for my preventive care visit? Counseling During your preventive care visit, your health care provider may ask about your: Medical history, including: Past medical problems. Family medical history. Current health, including: Emotional well-being. Home life and relationship well-being. Sexual activity. Lifestyle, including: Alcohol, nicotine or tobacco, and drug use. Access to firearms. Diet, exercise, and sleep habits. Safety issues such as seatbelt and bike helmet use. Sunscreen use. Work and work Astronomer. Physical exam Your health care provider will check your: Height and weight. These may be used to calculate your BMI (body mass index). BMI is a measurement that tells if you are at a healthy weight. Waist circumference. This measures the distance around your waistline. This measurement also tells if you are at a healthy weight and may help predict your risk of certain diseases, such as type 2 diabetes and high blood pressure. Heart rate and blood pressure. Body temperature. Skin for abnormal spots. What immunizations do I need?  Vaccines are usually given at various ages, according to a schedule. Your health care provider will recommend vaccines for you based on your age, medical history, and lifestyle or other factors, such as travel or where you work. What tests do I need? Screening Your health care provider may recommend screening tests for certain conditions. This may include: Lipid and cholesterol levels. Diabetes screening. This is done by checking your blood sugar (glucose)  after you have not eaten for a while (fasting). Hepatitis B test. Hepatitis C test. HIV (human immunodeficiency virus) test. STI (sexually transmitted infection) testing, if you are at risk. Lung cancer screening. Prostate cancer screening. Colorectal cancer screening. Talk with your health care provider about your test results, treatment options, and if necessary, the need for more tests. Follow these instructions at home: Eating and drinking  Eat a diet that includes fresh fruits and vegetables, whole grains, lean protein, and low-fat dairy products. Take vitamin and mineral supplements as recommended by your health care provider. Do not drink alcohol if your health care provider tells you not to drink. If you drink alcohol: Limit how much you have to 0-2 drinks a day. Know how much alcohol is in your drink. In the [place] one drink equals one 12 oz bottle of beer (355 mL), one 5 oz glass of wine (148 mL), or one 1 oz glass of hard liquor (44 mL). Lifestyle Brush your teeth every morning and night with fluoride toothpaste. Floss one time each day. Exercise for at least 30 minutes 5 or more days each week. Do not use any products that contain nicotine or tobacco. These products include cigarettes, chewing tobacco, and vaping devices, such as e-cigarettes. If you need help quitting, ask your health care provider. Do not use drugs. If you are sexually active, practice safe sex. Use a condom or other form of protection to prevent STIs. Take aspirin only as told by your health care provider. Make sure that you understand how much to take and what form to take. Work with your health care provider to find out whether it is safe and beneficial for you to take aspirin daily.  Find healthy ways to manage stress, such as: Meditation, yoga, or listening to music. Journaling. Talking to a trusted person. Spending time with friends and family. Minimize exposure to UV radiation to reduce your risk of  skin cancer. Safety Always wear your seat belt while driving or riding in a vehicle. Do not drive: If you have been drinking alcohol. Do not ride with someone who has been drinking. When you are tired or distracted. While texting. If you have been using any mind-altering substances or drugs. Wear a helmet and other protective equipment during sports activities. If you have firearms in your house, make sure you follow all gun safety procedures. What's next? Go to your health care provider once a year for an annual wellness visit. Ask your health care provider how often you should have your eyes and teeth checked. Stay up to date on all vaccines. This information is not intended to replace advice given to you by your health care provider. Make sure you discuss any questions you have with your health care provider. Document Revised: 05/10/2021 Document Reviewed: 05/10/2021 Elsevier Patient Education  2024 [employer].

## 2024-01-20 NOTE — Progress Notes (Signed)
 Subjective:    [name], male    DOB: 09-29-1961, 63 y.o.   MRN: [id number]  Chief Complaint  Patient presents with  . Annual Exam    HPI Discussed the use of AI scribe software for clinical note transcription with the patient, who gave verbal consent to proceed.  History of Present Illness Tommy Farrell is a 63 year old male who presents with left upper arm pain.  He has been experiencing persistent pain in his left upper arm since approximately July 2024. The pain is constant and worsens with certain positions, particularly at night when lying on his stomach with his arm raised. There is no radiation of the pain to the shoulder or hand, and the pain has remained unchanged over time.  He has attempted conservative management, including rest and applying heat, but reports no significant improvement. He has not experienced any major injuries to the arm and does not recall any specific event that might have triggered the pain.  He mentions a family history of pancreatic cancer, as his aunt was diagnosed with the condition after experiencing stomach pain and weight loss.    Past Medical History:  Diagnosis Date  . Allergy   . Constipation 12/25/2015  . Dysrhythmia    sinus arrythmia-2006-work up negative findings  . Hyperglycemia 03/15/2015  . Hyperlipemia   . Hypertension   . Leg pain, diffuse, right 04/15/2017  . Preventative health care 03/20/2015   Sees Dr Loreta Ave of gastroenterology    Past Surgical History:  Procedure Laterality Date  . KNEE ARTHROSCOPY  2009   rt knee  . KNEE ARTHROSCOPY  01/16/2012   Procedure: ARTHROSCOPY KNEE;  Surgeon: Loanne Drilling, MD;  Location: [facility];  Service: Orthopedics;  Laterality: Left;  WITH DEBRIDEMENT  . VENTRAL HERNIA REPAIR      Family History  Problem Relation Age of Onset  . Hypertension Mother   . Diabetes Mother        labile  . Kidney disease Mother        ESRD  . Peripheral  vascular disease Mother   . Heart disease Father        MI?  Marland Kitchen Learning disabilities Daughter   . Drug abuse Son   . Diabetes Maternal Aunt   . Heart disease Maternal Aunt   . Heart disease Cousin   . Diabetes Cousin   . Alcohol abuse Cousin     Social History   Socioeconomic History  . Marital status: Married    Spouse name: Delora  . Number of children: Not on file  . Years of education: 42  . Highest education level: Not on file  Occupational History  . Occupation: Naval architect for [employer]  . Smoking status: Never  . Smokeless tobacco: Never  Vaping Use  . Vaping status: Never Used  Substance and Sexual Activity  . Alcohol use: No  . Drug use: No  . Sexual activity: Yes    Comment: lives with wife, no major dietary restrictions, works for [employer]  Other Topics Concern  . Not on file  Social History Narrative  . Not on file   Social Drivers of Health   Financial Resource Strain: Patient Declined (12/04/2023)   Overall Financial Resource Strain (CARDIA)   . Difficulty of Paying Living Expenses: Patient declined  Food Insecurity: Patient Declined (12/04/2023)   Hunger Vital Sign   . Worried About Programme researcher, broadcasting/film/video in the Last  Year: Patient declined   . Ran Out of Food in the Last Year: Patient declined  Transportation Needs: No Transportation Needs (12/04/2023)   PRAPARE - Transportation   . Lack of Transportation (Medical): No   . Lack of Transportation (Non-Medical): No  Physical Activity: Sufficiently Active (12/04/2023)   Exercise Vital Sign   . Days of Exercise per Week: 5 days   . Minutes of Exercise per Session: 40 min  Stress: No Stress Concern Present (12/04/2023)   [employer] of Occupational Health - Occupational Stress Questionnaire   . Feeling of Stress : Not at all  Social Connections: Unknown (12/04/2023)   Social Connection and Isolation Panel [NHANES]   . Frequency of Communication with Friends and Family: Patient declined   .  Frequency of Social Gatherings with Friends and Family: Twice a week   . Attends Religious Services: More than 4 times per year   . Active Member of Clubs or Organizations: Patient declined   . Attends Banker Meetings: Not on file   . Marital Status: Married  Catering manager Violence: Unknown (02/26/2022)   [facility]   HITS   . Physically Hurt: Not on file   . Insult or Talk Down To: Not on file   . Threaten Physical Harm: Not on file   . Scream or Curse: Not on file    Outpatient Medications Prior to Visit  Medication Sig Dispense Refill  . ASPIRIN 81 PO Take by mouth daily.    Marland Kitchen azelastine (ASTELIN) 0.1 % nasal spray Place 1 spray into both nostrils 2 (two) times daily. Use in each nostril as directed 30 mL 0  . benzonatate (TESSALON) 100 MG capsule Take 1 capsule (100 mg total) by mouth 2 (two) times daily as needed for cough. 20 capsule 0  . Cholecalciferol (D3 PO) Take by mouth daily.    . COD LIVER OIL PO Take by mouth daily.    . fish oil-omega-3 fatty acids 1000 MG capsule Take 2 g by mouth daily.    . Garlic 10 MG CAPS Take by mouth.    . hydrochlorothiazide (HYDRODIURIL) 25 MG tablet Take 1 tablet by mouth once daily 90 tablet 0  . lisinopril (ZESTRIL) 10 MG tablet Take 1 tablet (10 mg total) by mouth daily. 90 tablet 1  . Multiple Vitamin (MULTIVITAMIN) tablet Take 1 tablet by mouth daily.    . Red Yeast Rice 600 MG CAPS Take 1 tablet by mouth daily.    Marland Kitchen ZINC CITRATE PO Take by mouth daily.     No facility-administered medications prior to visit.    No Known Allergies  Review of Systems  Constitutional:  Negative for chills, fever and malaise/fatigue.  HENT:  Negative for congestion and hearing loss.   Eyes:  Negative for discharge.  Respiratory:  Negative for cough, sputum production and shortness of breath.   Cardiovascular:  Negative for chest pain, palpitations and leg swelling.  Gastrointestinal:  Negative for  abdominal pain, blood in stool, constipation, diarrhea, heartburn, nausea and vomiting.  Genitourinary:  Negative for dysuria, frequency, hematuria and urgency.  Musculoskeletal:  Positive for joint pain. Negative for back pain, falls and myalgias.  Skin:  Negative for rash.  Neurological:  Negative for dizziness, sensory change, loss of consciousness, weakness and headaches.  Endo/Heme/Allergies:  Negative for environmental allergies. Does not bruise/bleed easily.  Psychiatric/Behavioral:  Negative for depression and suicidal ideas. The patient is not nervous/anxious and does not have insomnia.  Objective:    Physical Exam Vitals reviewed.  Constitutional:      General: He is not in acute distress.    Appearance: Normal appearance. He is not ill-appearing or diaphoretic.  HENT:     Head: Normocephalic and atraumatic.     Right Ear: Tympanic membrane, ear canal and external ear normal. There is no impacted cerumen.     Left Ear: Tympanic membrane, ear canal and external ear normal. There is no impacted cerumen.     Nose: Nose normal. No rhinorrhea.     Mouth/Throat:     Pharynx: Oropharynx is clear.  Eyes:     General: No scleral icterus.    Extraocular Movements: Extraocular movements intact.     Conjunctiva/sclera: Conjunctivae normal.     Pupils: Pupils are equal, round, and reactive to light.  Neck:     Thyroid: No thyroid mass or thyroid tenderness.  Cardiovascular:     Rate and Rhythm: Normal rate and regular rhythm.     Pulses: Normal pulses.     Heart sounds: Normal heart sounds. No murmur heard. Pulmonary:     Effort: Pulmonary effort is normal.     Breath sounds: Normal breath sounds. No wheezing.  Abdominal:     General: Bowel sounds are normal.     Palpations: Abdomen is soft. There is no mass.     Tenderness: There is no guarding.  Musculoskeletal:        General: No swelling. Normal range of motion.     Cervical back: Normal range of motion and neck  supple. No rigidity.     Right lower leg: No edema.     Left lower leg: No edema.  Lymphadenopathy:     Cervical: No cervical adenopathy.  Skin:    General: Skin is warm and dry.     Findings: No rash.  Neurological:     General: No focal deficit present.     Mental Status: He is alert and oriented to person, place, and time.     Cranial Nerves: No cranial nerve deficit.     Deep Tendon Reflexes: Reflexes normal.  Psychiatric:        Mood and Affect: Mood normal.        Behavior: Behavior normal.   BP 128/78 (BP Location: Left Arm, Patient Position: Sitting, Cuff Size: Large)   Pulse 72   Temp 98.3 F (36.8 C) (Oral)   Resp 18   Ht 5\' 6"  (1.676 m)   Wt 165 lb 9.6 oz (75.1 kg)   SpO2 95%   BMI 26.73 kg/m  Wt Readings from Last 3 Encounters:  01/20/24 165 lb 9.6 oz (75.1 kg)  12/27/23 161 lb 8 oz (73.3 kg)  12/04/23 166 lb 2 oz (75.4 kg)    Diabetic Foot Exam - Simple   No data filed    Lab Results  Component Value Date   WBC 3.5 (L) 01/20/2024   HGB 15.3 01/20/2024   HCT 45.6 01/20/2024   PLT 183.0 01/20/2024   GLUCOSE 96 01/20/2024   CHOL 225 (H) 01/20/2024   TRIG 80.0 01/20/2024   HDL 60.60 01/20/2024   LDLCALC 149 (H) 01/20/2024   ALT 20 01/20/2024   AST 19 01/20/2024   NA 140 01/20/2024   K 3.8 01/20/2024   CL 100 01/20/2024   CREATININE 1.12 01/20/2024   BUN 17 01/20/2024   CO2 33 (H) 01/20/2024   TSH 1.05 01/20/2024   PSA 4.17 (H) 08/17/2019   HGBA1C  6.0 01/20/2024    Lab Results  Component Value Date   TSH 1.05 01/20/2024   Lab Results  Component Value Date   WBC 3.5 (L) 01/20/2024   HGB 15.3 01/20/2024   HCT 45.6 01/20/2024   MCV 89.3 01/20/2024   PLT 183.0 01/20/2024   Lab Results  Component Value Date   NA 140 01/20/2024   K 3.8 01/20/2024   CO2 33 (H) 01/20/2024   GLUCOSE 96 01/20/2024   BUN 17 01/20/2024   CREATININE 1.12 01/20/2024   BILITOT 0.7 01/20/2024   ALKPHOS 44 01/20/2024   AST 19 01/20/2024   ALT 20 01/20/2024    PROT 6.9 01/20/2024   ALBUMIN 4.2 01/20/2024   CALCIUM 9.6 01/20/2024   ANIONGAP 8 01/24/2022   GFR 69.35 01/20/2024   Lab Results  Component Value Date   CHOL 225 (H) 01/20/2024   Lab Results  Component Value Date   HDL 60.60 01/20/2024   Lab Results  Component Value Date   LDLCALC 149 (H) 01/20/2024   Lab Results  Component Value Date   TRIG 80.0 01/20/2024   Lab Results  Component Value Date   CHOLHDL 4 01/20/2024   Lab Results  Component Value Date   HGBA1C 6.0 01/20/2024       Assessment & Plan:  Hyperglycemia Assessment & Plan: hgba1c acceptable, minimize simple carbs. Increase exercise as tolerated.   Orders: -     Hemoglobin A1c  Moderate mixed hyperlipidemia not requiring statin therapy Assessment & Plan: Encourage heart healthy diet such as MIND or DASH diet, increase exercise, avoid trans fats, simple carbohydrates and processed foods, consider a krill or fish or flaxseed oil cap daily.   Orders: -     Lipid panel  Primary hypertension Assessment & Plan: Well controlled, no changes to meds. Encouraged heart healthy diet such as the DASH diet and exercise as tolerated.   Orders: -     CBC with Differential/Platelet -     Comprehensive metabolic panel -     Lipid panel -     TSH  Preventative health care Assessment & Plan: Patient encouraged to maintain heart healthy diet, regular exercise, adequate sleep. Consider daily probiotics. Take medications as prescribed. Labs reviewed. Given and reviewed copy of ACP documents from [facility] and encouraged to complete and return. Last colonoscopy 2022 repeat in 2032 for screening purposes   Left upper arm pain -     DG Humerus Left; Future -     Ambulatory referral to Sports Medicine    Assessment and Plan Assessment & Plan Left Arm Pain Chronic pain localized to the upper left arm, exacerbated by certain positions and movements. Suspected tendonitis of the biceps or triceps. Failed  conservative management. -Order X-ray of the left arm. -Refer to sports medicine for further evaluation and potential ultrasound imaging.  Sleep Hygiene Reports typically sleeping 5 hours per night, which is less than the recommended 6-8 hours. No reported difficulty falling or staying asleep. -Encourage aiming for at least 6 hours of sleep per night for optimal health.  Gallstones Asymptomatic gallstones noted on previous imaging. No current symptoms suggestive of cholecystitis. -Continue monitoring symptoms. Advise to avoid fatty foods and large meals before bedtime.  General Health Maintenance -Continue current vaccination schedule. Noted receipt of shingles vaccine. -Encourage hydration, balanced diet, exercise, and maintaining social connections. -Review advanced care planning documents and submit to office. -Continue monitoring bowel habits, noting improvement with current regimen. -Advise on tetanus shot protocol  for significant injuries. -Consider pneumonia and RSV vaccines at age 50. -Follow-up in 6 months.     Danise Edge, MD

## 2024-01-20 NOTE — Progress Notes (Signed)
Nursing note reviewed. Agree with documention and plan.  

## 2024-02-07 ENCOUNTER — Other Ambulatory Visit: Payer: Self-pay | Admitting: Family Medicine

## 2024-02-07 NOTE — Progress Notes (Signed)
 Rubin Payor, PhD, LAT, ATC acting as a scribe for Clementeen Graham, MD.  Tommy Farrell is a 63 y.o. male who presents to [facility] at [facility] today for L arm pain ongoing since this summer. He does not recall any injury. Pain is disturbing his sleep at night. Pt locates pain to the posterior aspect of his R upper arm. Pain is not very bothersome during the day.   Radiates: no Aggravates: L-side lying Treatments tried: rest, heat,   Dx imaging: 01/20/24 L humerus XR  Pertinent review of systems: No fevers or chills  Relevant historical information: Hypertension.  Patient is a Naval architect for [employer]   Exam:  BP (!) 162/94   Pulse (!) 54   Ht 5\' 6"  (1.676 m)   Wt 164 lb (74.4 kg)   SpO2 98%   BMI 26.47 kg/m  General: Well Developed, well nourished, and in no acute distress.   MSK: C-spine: Normal appearing normal cervical motion. Positive Spurling's test left arm. Upper extremity strength and reflexes and sensation are equal and normal throughout.  Left shoulder normal-appearing normal motion. Intact strength. Negative Hawkins and Neer's test.  Negative empty can test. Negative Yergason's and speeds test. Resisted elbow extension does not reproduce pain.    Radiology:  [facility] MSK Ultrasound of: Left shoulder Biceps tendon intact normal-appearing Subscapularis tendon is normal-appearing Supraspinatus tendon intact without visible tear. Infraspinatus tendon is normal-appearing. AC joint no significant effusion. Impression: Benign MSK ultrasound examination of the left shoulder.   X-ray images C-spine obtained today personally and independently interpreted. 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  No acute fractures no severe DJD Await formal radiology review   Assessment and Plan: 63 y.o. male with left upper arm pain.  This is a chronic mild pain ongoing for about 7 months now.  I am unable to reproduce the pain except when I am doing provocative  cervical radicular test including Spurling's test.  Provocative shoulder tests do not reproduce pain.  Differential includes cervical radiculopathy thought to be C7 left side.  We discussed options.  Plan for prednisone and gabapentin at bedtime.  He should not take gabapentin while driving.  If not improved enough consider formal physical therapy.  Ultimately we may proceed to MRI cervical spine for injection planning.  He does not think his pain is bothersome enough at this time to do an epidural steroid injection.   PDMP not reviewed this encounter. Orders Placed This Encounter  Procedures   [place] LIMITED JOINT SPACE STRUCTURES UP LEFT(NO LINKED CHARGES)    Reason for Exam (SYMPTOM  OR DIAGNOSIS REQUIRED):   left upper arm pain    Preferred imaging location?:   [facility] Cervical Spine 2 or 3 views    Standing Status:   Future    Number of Occurrences:   1    Expiration Date:   02/09/2025    Reason for Exam (SYMPTOM  OR DIAGNOSIS REQUIRED):   cervical radiculopathy    Preferred imaging location?:    [facility]   Meds ordered this encounter  Medications   predniSONE (DELTASONE) 50 MG tablet    Sig: Take 1 pill daily for 5 days    Dispense:  5 tablet    Refill:  0   gabapentin (NEURONTIN) 100 MG capsule    Sig: Take 1-3 capsules (100-300 mg total) by mouth at bedtime.    Dispense:  30 capsule    Refill:  3  Discussed warning signs or symptoms. Please see discharge instructions. Patient expresses understanding.   The above documentation has been reviewed and is accurate and complete Clementeen Graham, M.D.

## 2024-02-09 ENCOUNTER — Encounter: Payer: Self-pay | Admitting: Family Medicine

## 2024-02-10 ENCOUNTER — Other Ambulatory Visit: Payer: Self-pay

## 2024-02-10 ENCOUNTER — Ambulatory Visit (INDEPENDENT_AMBULATORY_CARE_PROVIDER_SITE_OTHER): Payer: 59 | Admitting: Family Medicine

## 2024-02-10 ENCOUNTER — Ambulatory Visit (INDEPENDENT_AMBULATORY_CARE_PROVIDER_SITE_OTHER)

## 2024-02-10 VITALS — BP 162/94 | HR 54 | Ht 66.0 in | Wt 164.0 lb

## 2024-02-10 DIAGNOSIS — M5412 Radiculopathy, cervical region: Secondary | ICD-10-CM | POA: Diagnosis not present

## 2024-02-10 DIAGNOSIS — M79622 Pain in left upper arm: Secondary | ICD-10-CM | POA: Diagnosis not present

## 2024-02-10 MED ORDER — PREDNISONE 50 MG PO TABS: ORAL_TABLET | ORAL | 0 refills | Status: DC

## 2024-02-10 MED ORDER — GABAPENTIN 100 MG PO CAPS: 100.0000 mg | ORAL_CAPSULE | Freq: Every day | ORAL | 3 refills | Status: DC

## 2024-02-10 NOTE — Patient Instructions (Addendum)
 Thank you for coming in today.   Please get an Xray today before you leave   I've sent a prescription for Prednisone & Gabapentin to your pharmacy.   Let me know if not getting better

## 2024-02-24 ENCOUNTER — Encounter: Payer: Self-pay | Admitting: Family Medicine

## 2024-02-24 NOTE — Progress Notes (Signed)
Cervical spine x-ray shows mild arthritis

## 2024-04-29 ENCOUNTER — Other Ambulatory Visit: Payer: Self-pay | Admitting: Family Medicine

## 2024-05-09 ENCOUNTER — Other Ambulatory Visit: Payer: Self-pay | Admitting: Family Medicine

## 2024-07-21 NOTE — Assessment & Plan Note (Signed)
Encouraged heart healthy diet such as the DASH diet and exercise as tolerated.

## 2024-07-21 NOTE — Assessment & Plan Note (Signed)
 Encourage heart healthy diet such as MIND or DASH diet, increase exercise, avoid trans fats, simple carbohydrates and processed foods, consider a krill or fish or flaxseed oil cap daily.

## 2024-07-21 NOTE — Assessment & Plan Note (Signed)
 hgba1c acceptable, minimize simple carbs. Increase exercise as tolerated.

## 2024-07-23 ENCOUNTER — Ambulatory Visit (INDEPENDENT_AMBULATORY_CARE_PROVIDER_SITE_OTHER): Payer: 59 | Admitting: Family Medicine

## 2024-07-23 ENCOUNTER — Encounter: Payer: Self-pay | Admitting: Family Medicine

## 2024-07-23 VITALS — BP 132/80 | HR 69 | Resp 16 | Ht 66.0 in | Wt 165.6 lb

## 2024-07-23 DIAGNOSIS — E782 Mixed hyperlipidemia: Secondary | ICD-10-CM

## 2024-07-23 DIAGNOSIS — R739 Hyperglycemia, unspecified: Secondary | ICD-10-CM

## 2024-07-23 DIAGNOSIS — I10 Essential (primary) hypertension: Secondary | ICD-10-CM | POA: Diagnosis not present

## 2024-07-23 LAB — TSH: TSH: 1.34 u[IU]/mL (ref 0.35–5.50)

## 2024-07-23 LAB — CBC WITH DIFFERENTIAL/PLATELET
Basophils Absolute: 0 K/uL (ref 0.0–0.1)
Basophils Relative: 0.8 % (ref 0.0–3.0)
Eosinophils Absolute: 0.1 K/uL (ref 0.0–0.7)
Eosinophils Relative: 2.3 % (ref 0.0–5.0)
HCT: 45.7 % (ref 39.0–52.0)
Hemoglobin: 14.8 g/dL (ref 13.0–17.0)
Lymphocytes Relative: 37.8 % (ref 12.0–46.0)
Lymphs Abs: 1.6 K/uL (ref 0.7–4.0)
MCHC: 32.4 g/dL (ref 30.0–36.0)
MCV: 84.3 fl (ref 78.0–100.0)
Monocytes Absolute: 0.5 K/uL (ref 0.1–1.0)
Monocytes Relative: 11.4 % (ref 3.0–12.0)
Neutro Abs: 2 K/uL (ref 1.4–7.7)
Neutrophils Relative %: 47.7 % (ref 43.0–77.0)
Platelets: 196 K/uL (ref 150.0–400.0)
RBC: 5.42 Mil/uL (ref 4.22–5.81)
RDW: 17.6 % — ABNORMAL HIGH (ref 11.5–15.5)
WBC: 4.1 K/uL (ref 4.0–10.5)

## 2024-07-23 LAB — HEMOGLOBIN A1C: Hgb A1c MFr Bld: 6.4 % (ref 4.6–6.5)

## 2024-07-23 LAB — COMPREHENSIVE METABOLIC PANEL WITH GFR
ALT: 21 U/L (ref 0–53)
AST: 16 U/L (ref 0–37)
Albumin: 4 g/dL (ref 3.5–5.2)
Alkaline Phosphatase: 49 U/L (ref 39–117)
BUN: 20 mg/dL (ref 6–23)
CO2: 35 meq/L — ABNORMAL HIGH (ref 19–32)
Calcium: 9.3 mg/dL (ref 8.4–10.5)
Chloride: 98 meq/L (ref 96–112)
Creatinine, Ser: 1.22 mg/dL (ref 0.40–1.50)
GFR: 62.36 mL/min (ref >60.00)
Glucose, Bld: 105 mg/dL — ABNORMAL HIGH (ref 70–99)
Potassium: 4.1 meq/L (ref 3.5–5.1)
Sodium: 141 meq/L (ref 135–145)
Total Bilirubin: 0.5 mg/dL (ref 0.2–1.2)
Total Protein: 6.8 g/dL (ref 6.0–8.3)

## 2024-07-23 LAB — LIPID PANEL
Cholesterol: 243 mg/dL — ABNORMAL HIGH (ref 0–200)
HDL: 57.6 mg/dL (ref >39.00)
LDL Cholesterol: 161 mg/dL — ABNORMAL HIGH (ref 0–99)
NonHDL: 185.22
Total CHOL/HDL Ratio: 4
Triglycerides: 123 mg/dL (ref 0.0–149.0)
VLDL: 24.6 mg/dL (ref 0.0–40.0)

## 2024-07-23 NOTE — Progress Notes (Signed)
 Subjective:    [name], male    DOB: [birth year], 63 y.o.   MRN: [id number]  Chief Complaint  Patient presents with   Medical Management of Chronic Issues    Patient presents today for a 6 month follow-up.   Quality Metric Gaps    Pneumococcal vaccine    HPI Discussed the use of AI scribe software for clinical note transcription with the patient, who gave verbal consent to proceed.  History of Present Illness Tommy Farrell is a 63 year old male who presents for a routine follow-up visit.  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 He finds the requirement for the clinic to send the results directly to the [facility] stressful due to the lack of control over the process.  He recalls a past episode of intermittent foot pain, which he attributes to aging. Currently, he has no pain or other symptoms such as headache or chest pain.  He is scheduled to see a urologist in October for further evaluation of his prostate. His prostate-specific antigen (PSA) levels were previously elevated but returned to normal after four months. The upcoming test may involve imaging and requires him to take an antibiotic and perform an enema beforehand.  He discusses his bowel habits, noting that he takes a probiotic which he finds effective. No c/o CP/palp/SOB/HA/congestion/fevers/GI or GU c/o. Taking meds as prescribed     Past Medical History:  Diagnosis Date   Allergy    Constipation 12/25/2015   Dysrhythmia    sinus arrythmia-2006-work up negative findings   Hyperglycemia 03/15/2015   Hyperlipemia    Hypertension    Leg pain, diffuse, right 04/15/2017   Preventative health care 03/20/2015   Sees Dr Kristie of gastroenterology    Past Surgical History:  Procedure Laterality Date   KNEE ARTHROSCOPY  2009   rt knee   KNEE ARTHROSCOPY  01/16/2012   Procedure: ARTHROSCOPY KNEE;  Surgeon: Dempsey LULLA Moan, MD;  Location: [facility];  Service: Orthopedics;  Laterality: Left;  WITH DEBRIDEMENT   VENTRAL HERNIA REPAIR      Family History  Problem Relation Age of Onset   Hypertension Mother    Diabetes Mother        labile   Kidney disease Mother        ESRD   Peripheral vascular disease Mother    Heart disease Father        MI?   Learning disabilities Daughter    Drug abuse Son    Diabetes Maternal Aunt    Heart disease Maternal Aunt    Heart disease Cousin    Diabetes Cousin    Alcohol abuse Cousin     Social History   Socioeconomic History   Marital status: Married    Spouse name: Delora   Number of children: Not on file   Years of education: 12   Highest education level: 12th grade  Occupational History   Occupation: Naval architect for [employer]  Tobacco Use   Smoking status: Never   Smokeless tobacco: Never  Vaping Use   Vaping status: Never Used  Substance and Sexual Activity   Alcohol use: No   Drug use: No   Sexual activity: Yes    Comment: lives with wife, no major dietary restrictions, works for [employer]  Other Topics Concern   Not on file  Social History Narrative   Not on file  Social Drivers of Health   Financial Resource Strain: Medium Risk (07/16/2024)   Overall Financial Resource Strain (CARDIA)    Difficulty of Paying Living Expenses: Somewhat hard  Food Insecurity: No Food Insecurity (07/16/2024)   Hunger Vital Sign    Worried About Running Out of Food in the Last Year: Never true    Ran Out of Food in the Last Year: Never true  Transportation Needs: No Transportation Needs (07/16/2024)   PRAPARE - Administrator, Civil Service (Medical): No    Lack of Transportation (Non-Medical): No  Physical Activity: Sufficiently Active (07/16/2024)   Exercise Vital Sign    Days of Exercise per Week: 5 days    Minutes of Exercise per Session: 30 min  Stress: No Stress Concern Present (07/16/2024)   [employer] of  Occupational Health - Occupational Stress Questionnaire    Feeling of Stress: Not at all  Social Connections: Socially Integrated (07/16/2024)   Social Connection and Isolation Panel    Frequency of Communication with Friends and Family: More than three times a week    Frequency of Social Gatherings with Friends and Family: Once a week    Attends Religious Services: More than 4 times per year    Active Member of [employer] or Organizations: Yes    Attends Banker Meetings: More than 4 times per year    Marital Status: Married  Catering manager Violence: Unknown (02/26/2022)   Received from [facility]   HITS    Physically Hurt: Not on file    Insult or Talk Down To: Not on file    Threaten Physical Harm: Not on file    Scream or Curse: Not on file    Outpatient Medications Prior to Visit  Medication Sig Dispense Refill   ASPIRIN 81 PO Take by mouth daily.     azelastine  (ASTELIN ) 0.1 % nasal spray Place 1 spray into both nostrils 2 (two) times daily. Use in each nostril as directed 30 mL 0   benzonatate  (TESSALON ) 100 MG capsule Take 1 capsule (100 mg total) by mouth 2 (two) times daily as needed for cough. 20 capsule 0   Cholecalciferol (D3 PO) Take by mouth daily.     COD LIVER OIL PO Take by mouth daily.     fish oil-omega-3 fatty acids 1000 MG capsule Take 2 g by mouth daily.     gabapentin  (NEURONTIN ) 100 MG capsule Take 1-3 capsules (100-300 mg total) by mouth at bedtime. 30 capsule 3   Garlic 10 MG CAPS Take by mouth.     hydrochlorothiazide  (HYDRODIURIL ) 25 MG tablet Take 1 tablet (25 mg total) by mouth daily. 90 tablet 0   lisinopril  (ZESTRIL ) 10 MG tablet Take 1 tablet by mouth once daily 90 tablet 0   Multiple Vitamin (MULTIVITAMIN) tablet Take 1 tablet by mouth daily.     Red Yeast Rice 600 MG CAPS Take 1 tablet by mouth daily.     ZINC CITRATE PO Take by mouth daily.     predniSONE  (DELTASONE ) 50 MG tablet Take 1 pill daily for 5 days 5 tablet 0   No  facility-administered medications prior to visit.    No Known Allergies  Review of Systems  Constitutional:  Negative for fever and malaise/fatigue.  HENT:  Negative for congestion.   Eyes:  Negative for blurred vision.  Respiratory:  Negative for shortness of breath.   Cardiovascular:  Negative for chest pain, palpitations and leg swelling.  Gastrointestinal:  Negative  for abdominal pain, blood in stool and nausea.  Genitourinary:  Negative for dysuria and frequency.  Musculoskeletal:  Negative for falls.  Skin:  Negative for rash.  Neurological:  Negative for dizziness, loss of consciousness and headaches.  Endo/Heme/Allergies:  Negative for environmental allergies.  Psychiatric/Behavioral:  Negative for depression. The patient is not nervous/anxious.        Objective:    Physical Exam Vitals reviewed.  Constitutional:      Appearance: Normal appearance. He is not ill-appearing.  HENT:     Head: Normocephalic and atraumatic.     Nose: Nose normal.  Eyes:     Conjunctiva/sclera: Conjunctivae normal.  Cardiovascular:     Rate and Rhythm: Normal rate.     Pulses: Normal pulses.     Heart sounds: Normal heart sounds. No murmur heard. Pulmonary:     Effort: Pulmonary effort is normal.     Breath sounds: Normal breath sounds. No wheezing.  Abdominal:     Palpations: Abdomen is soft. There is no mass.     Tenderness: There is no abdominal tenderness.  Musculoskeletal:     Cervical back: Normal range of motion.     Right lower leg: No edema.     Left lower leg: No edema.  Skin:    General: Skin is warm and dry.  Neurological:     General: No focal deficit present.     Mental Status: He is alert and oriented to person, place, and time.  Psychiatric:        Mood and Affect: Mood normal.     BP 132/80   Pulse 69   Resp 16   Ht 5' 6 (1.676 m)   Wt 165 lb 9.6 oz (75.1 kg)   SpO2 96%   BMI 26.73 kg/m  Wt Readings from Last 3 Encounters:  07/23/24 165 lb 9.6 oz  (75.1 kg)  02/10/24 164 lb (74.4 kg)  01/20/24 165 lb 9.6 oz (75.1 kg)    Diabetic Foot Exam - Simple   No data filed    Lab Results  Component Value Date   WBC 3.5 (L) 01/20/2024   HGB 15.3 01/20/2024   HCT 45.6 01/20/2024   PLT 183.0 01/20/2024   GLUCOSE 96 01/20/2024   CHOL 225 (H) 01/20/2024   TRIG 80.0 01/20/2024   HDL 60.60 01/20/2024   LDLCALC 149 (H) 01/20/2024   ALT 20 01/20/2024   AST 19 01/20/2024   NA 140 01/20/2024   K 3.8 01/20/2024   CL 100 01/20/2024   CREATININE 1.12 01/20/2024   BUN 17 01/20/2024   CO2 33 (H) 01/20/2024   TSH 1.05 01/20/2024   PSA 4.17 (H) 08/17/2019   HGBA1C 6.0 01/20/2024    Lab Results  Component Value Date   TSH 1.05 01/20/2024   Lab Results  Component Value Date   WBC 3.5 (L) 01/20/2024   HGB 15.3 01/20/2024   HCT 45.6 01/20/2024   MCV 89.3 01/20/2024   PLT 183.0 01/20/2024   Lab Results  Component Value Date   NA 140 01/20/2024   K 3.8 01/20/2024   CO2 33 (H) 01/20/2024   GLUCOSE 96 01/20/2024   BUN 17 01/20/2024   CREATININE 1.12 01/20/2024   BILITOT 0.7 01/20/2024   ALKPHOS 44 01/20/2024   AST 19 01/20/2024   ALT 20 01/20/2024   PROT 6.9 01/20/2024   ALBUMIN 4.2 01/20/2024   CALCIUM 9.6 01/20/2024   ANIONGAP 8 01/24/2022   GFR 69.35 01/20/2024   Lab  Results  Component Value Date   CHOL 225 (H) 01/20/2024   Lab Results  Component Value Date   HDL 60.60 01/20/2024   Lab Results  Component Value Date   LDLCALC 149 (H) 01/20/2024   Lab Results  Component Value Date   TRIG 80.0 01/20/2024   Lab Results  Component Value Date   CHOLHDL 4 01/20/2024   Lab Results  Component Value Date   HGBA1C 6.0 01/20/2024       Assessment & Plan:  Hyperglycemia Assessment & Plan: hgba1c acceptable, minimize simple carbs. Increase exercise as tolerated.   Orders: -     Hemoglobin A1c  Moderate mixed hyperlipidemia not requiring statin therapy Assessment & Plan: Encourage heart healthy diet such as  MIND or DASH diet, increase exercise, avoid trans fats, simple carbohydrates and processed foods, consider a krill or fish or flaxseed oil cap daily.   Orders: -     Lipid panel -     TSH  Primary hypertension Assessment & Plan: Encouraged heart healthy diet such as the DASH diet and exercise as tolerated.   Orders: -     Comprehensive metabolic panel with GFR -     CBC with Differential/Platelet    Assessment and Plan Assessment & Plan Adult Wellness Visit Discussion on the importance of vaccinations for individuals aged 45 and older, including flu, COVID, pneumonia, and RSV vaccines. Emphasis on the decline in immune system efficiency with age and the benefits of vaccination in reducing the risk of severe illness. Discussion on bowel health and the importance of fiber and probiotics in maintaining regular bowel movements. Highlighted the importance of regular exercise and adequate fluid intake to support bowel function. - Recommend flu shot, COVID shot, Prevnar 20, and RSV vaccine for individuals aged 60 and older. - Advise on the use of fiber supplements such as Benefiber or psyllium to maintain bowel health. - Encourage the use of probiotics to support digestive health. - Suggest regular exercise and adequate fluid intake to support bowel function.  Primary hypertension Blood pressure is currently well-controlled. Emphasis on maintaining blood pressure through lifestyle modifications and regular monitoring. Encouraged deep breathing exercises to help maintain blood pressure control. - Continue current antihypertensive regimen. - Encourage deep breathing exercises to help maintain blood pressure control.  Recording duration: 20 minutes     Harlene Horton, MD

## 2024-07-23 NOTE — Patient Instructions (Addendum)
 Flu and Covid annually at pharmacy  Prevnar 20 once at pharmacy RSV, Respiratory Syncitial Virus, Arexvy once at pharmacy  Metamucil/Psyllium daily in powder, capsule or wafer options Benefiber powder daily   Hypertension, Adult High blood pressure (hypertension) is when the force of blood pumping through the arteries is too strong. The arteries are the blood vessels that carry blood from the heart throughout the body. Hypertension forces the heart to work harder to pump blood and may cause arteries to become narrow or stiff. Untreated or uncontrolled hypertension can lead to a heart attack, heart failure, a stroke, kidney disease, and other problems. A blood pressure reading consists of a higher number over a lower number. Ideally, your blood pressure should be below 120/80. The first (top) number is called the systolic pressure. It is a measure of the pressure in your arteries as your heart beats. The second (bottom) number is called the diastolic pressure. It is a measure of the pressure in your arteries as the heart relaxes. What are the causes? The exact cause of this condition is not known. There are some conditions that result in high blood pressure. What increases the risk? Certain factors may make you more likely to develop high blood pressure. Some of these risk factors are under your control, including: Smoking. Not getting enough exercise or physical activity. Being overweight. Having too much fat, sugar, calories, or salt (sodium) in your diet. Drinking too much alcohol. Other risk factors include: Having a personal history of heart disease, diabetes, high cholesterol, or kidney disease. Stress. Having a family history of high blood pressure and high cholesterol. Having obstructive sleep apnea. Age. The risk increases with age. What are the signs or symptoms? High blood pressure may not cause symptoms. Very high blood pressure (hypertensive crisis) may  cause: Headache. Fast or irregular heartbeats (palpitations). Shortness of breath. Nosebleed. Nausea and vomiting. Vision changes. Severe chest pain, dizziness, and seizures. How is this diagnosed? This condition is diagnosed by measuring your blood pressure while you are seated, with your arm resting on a flat surface, your legs uncrossed, and your feet flat on the floor. The cuff of the blood pressure monitor will be placed directly against the skin of your upper arm at the level of your heart. Blood pressure should be measured at least twice using the same arm. Certain conditions can cause a difference in blood pressure between your right and left arms. If you have a high blood pressure reading during one visit or you have normal blood pressure with other risk factors, you may be asked to: Return on a different day to have your blood pressure checked again. Monitor your blood pressure at home for 1 week or longer. If you are diagnosed with hypertension, you may have other blood or imaging tests to help your health care provider understand your overall risk for other conditions. How is this treated? This condition is treated by making healthy lifestyle changes, such as eating healthy foods, exercising more, and reducing your alcohol intake. You may be referred for counseling on a healthy diet and physical activity. Your health care provider may prescribe medicine if lifestyle changes are not enough to get your blood pressure under control and if: Your systolic blood pressure is above 130. Your diastolic blood pressure is above 80. Your personal target blood pressure may vary depending on your medical conditions, your age, and other factors. Follow these instructions at home: Eating and drinking  Eat a diet that is high in fiber  and potassium, and low in sodium, added sugar, and fat. An example of this eating plan is called the DASH diet. DASH stands for Dietary Approaches to Stop  Hypertension. To eat this way: Eat plenty of fresh fruits and vegetables. Try to fill one half of your plate at each meal with fruits and vegetables. Eat whole grains, such as whole-wheat pasta, brown rice, or whole-grain bread. Fill about one fourth of your plate with whole grains. Eat or drink low-fat dairy products, such as skim milk or low-fat yogurt. Avoid fatty cuts of meat, processed or cured meats, and poultry with skin. Fill about one fourth of your plate with lean proteins, such as fish, chicken without skin, beans, eggs, or tofu. Avoid pre-made and processed foods. These tend to be higher in sodium, added sugar, and fat. Reduce your daily sodium intake. Many people with hypertension should eat less than 1,500 mg of sodium a day. Do not drink alcohol if: Your health care provider tells you not to drink. You are pregnant, may be pregnant, or are planning to become pregnant. If you drink alcohol: Limit how much you have to: 0-1 drink a day for women. 0-2 drinks a day for men. Know how much alcohol is in your drink. In the [place] one drink equals one 12 oz bottle of beer (355 mL), one 5 oz glass of wine (148 mL), or one 1 oz glass of hard liquor (44 mL). Lifestyle  Work with your health care provider to maintain a healthy body weight or to lose weight. Ask what an ideal weight is for you. Get at least 30 minutes of exercise that causes your heart to beat faster (aerobic exercise) most days of the week. Activities may include walking, swimming, or biking. Include exercise to strengthen your muscles (resistance exercise), such as Pilates or lifting weights, as part of your weekly exercise routine. Try to do these types of exercises for 30 minutes at least 3 days a week. Do not use any products that contain nicotine or tobacco. These products include cigarettes, chewing tobacco, and vaping devices, such as e-cigarettes. If you need help quitting, ask your health care provider. Monitor your  blood pressure at home as told by your health care provider. Keep all follow-up visits. This is important. Medicines Take over-the-counter and prescription medicines only as told by your health care provider. Follow directions carefully. Blood pressure medicines must be taken as prescribed. Do not skip doses of blood pressure medicine. Doing this puts you at risk for problems and can make the medicine less effective. Ask your health care provider about side effects or reactions to medicines that you should watch for. Contact a health care provider if you: Think you are having a reaction to a medicine you are taking. Have headaches that keep coming back (recurring). Feel dizzy. Have swelling in your ankles. Have trouble with your vision. Get help right away if you: Develop a severe headache or confusion. Have unusual weakness or numbness. Feel faint. Have severe pain in your chest or abdomen. Vomit repeatedly. Have trouble breathing. These symptoms may be an emergency. Get help right away. Call 911. Do not wait to see if the symptoms will go away. Do not drive yourself to the hospital. Summary Hypertension is when the force of blood pumping through your arteries is too strong. If this condition is not controlled, it may put you at risk for serious complications. Your personal target blood pressure may vary depending on your medical conditions, your age,  and other factors. For most people, a normal blood pressure is less than 120/80. Hypertension is treated with lifestyle changes, medicines, or a combination of both. Lifestyle changes include losing weight, eating a healthy, low-sodium diet, exercising more, and limiting alcohol. This information is not intended to replace advice given to you by your health care provider. Make sure you discuss any questions you have with your health care provider. Document Revised: 09/19/2021 Document Reviewed: 09/19/2021 Elsevier Patient Education  2024  [employer].

## 2024-07-24 ENCOUNTER — Ambulatory Visit: Payer: Self-pay | Admitting: Family Medicine

## 2024-07-24 DIAGNOSIS — E785 Hyperlipidemia, unspecified: Secondary | ICD-10-CM

## 2024-07-24 DIAGNOSIS — R739 Hyperglycemia, unspecified: Secondary | ICD-10-CM

## 2024-08-06 ENCOUNTER — Encounter (HOSPITAL_BASED_OUTPATIENT_CLINIC_OR_DEPARTMENT_OTHER): Payer: Self-pay

## 2024-08-06 ENCOUNTER — Other Ambulatory Visit: Payer: Self-pay | Admitting: Family Medicine

## 2024-08-06 ENCOUNTER — Other Ambulatory Visit: Payer: Self-pay

## 2024-08-06 ENCOUNTER — Emergency Department (HOSPITAL_BASED_OUTPATIENT_CLINIC_OR_DEPARTMENT_OTHER)
Admission: EM | Admit: 2024-08-06 | Discharge: 2024-08-06 | Disposition: A | Discharge status: Home / Self Care | Attending: Emergency Medicine | Admitting: Emergency Medicine

## 2024-08-06 DIAGNOSIS — X58XXXA Exposure to other specified factors, initial encounter: Secondary | ICD-10-CM | POA: Diagnosis not present

## 2024-08-06 DIAGNOSIS — S46812A Strain of other muscles, fascia and tendons at shoulder and upper arm level, left arm, initial encounter: Secondary | ICD-10-CM | POA: Insufficient documentation

## 2024-08-06 DIAGNOSIS — S4992XA Unspecified injury of left shoulder and upper arm, initial encounter: Secondary | ICD-10-CM | POA: Diagnosis present

## 2024-08-06 DIAGNOSIS — Z7982 Long term (current) use of aspirin: Secondary | ICD-10-CM | POA: Insufficient documentation

## 2024-08-06 MED ORDER — DICLOFENAC SODIUM 1 % EX GEL
4.0000 g | Freq: Four times a day (QID) | CUTANEOUS | 0 refills | Status: DC
Start: 2024-08-06 — End: 2024-09-14

## 2024-08-06 MED ORDER — KETOROLAC TROMETHAMINE 15 MG/ML IJ SOLN
15.0000 mg | Freq: Once | INTRAMUSCULAR | Status: AC
  Administered 2024-08-06: 15 mg via INTRAMUSCULAR
  Filled 2024-08-06: qty 1

## 2024-08-06 NOTE — Discharge Instructions (Addendum)
 I think most likely you strained a muscle.  Try to rest it as best you can. Follow up with your family doc.   Use the gel as prescribed.  Also take tylenol  1000mg (2 extra strength) four times a day.

## 2024-08-06 NOTE — ED Triage Notes (Signed)
 Pt report left arm & shoulder pain X 2 days. Denies any other symptoms

## 2024-08-06 NOTE — ED Provider Notes (Signed)
 [facility] Provider Note   CSN: [id number] Arrival date & time: 08/06/24  1134     Patient presents with: Shoulder Pain   Tommy Farrell is a 63 y.o. male.   63 yo M with a chief complaints of left shoulder pain.  Going on for couple days.  Mostly to the posterior aspect of the shoulder and also to the lateral aspect of the forearm.  He denies trauma denies fevers denies neck pain.   Shoulder Pain      Prior to Admission medications   Medication Sig Start Date End Date Taking? Authorizing Provider  diclofenac  Sodium (VOLTAREN ) 1 % GEL Apply 4 g topically 4 (four) times daily. 08/06/24  Yes Emil Share, DO  ASPIRIN 81 PO Take by mouth daily.    [provider]  azelastine  (ASTELIN ) 0.1 % nasal spray Place 1 spray into both nostrils 2 (two) times daily. Use in each nostril as directed 12/04/23   Drubel, Manuelita, PA-C  benzonatate  (TESSALON ) 100 MG capsule Take 1 capsule (100 mg total) by mouth 2 (two) times daily as needed for cough. 12/04/23   Cyndi Manuelita, PA-C  Cholecalciferol (D3 PO) Take by mouth daily.    [provider]  COD LIVER OIL PO Take by mouth daily.    [provider]  fish oil-omega-3 fatty acids 1000 MG capsule Take 2 g by mouth daily.    [provider]  gabapentin  (NEURONTIN ) 100 MG capsule Take 1-3 capsules (100-300 mg total) by mouth at bedtime. 02/10/24   Corey, Evan S, MD  Garlic 10 MG CAPS Take by mouth.    [provider]  hydrochlorothiazide  (HYDRODIURIL ) 25 MG tablet Take 1 tablet (25 mg total) by mouth daily. 08/06/24   Domenica Harlene LABOR, MD  lisinopril  (ZESTRIL ) 10 MG tablet Take 1 tablet by mouth once daily 04/29/24   Domenica Harlene LABOR, MD  Multiple Vitamin (MULTIVITAMIN) tablet Take 1 tablet by mouth daily.    [provider]  Red Yeast Rice 600 MG CAPS Take 1 tablet by mouth daily.    [provider]  ZINC CITRATE PO Take by mouth daily.    [provider]    Allergies: Patient has no known allergies.    Review of Systems  Updated Vital Signs BP (!) 142/90   Pulse 66   Temp (!) 96.7 F (35.9 C)   Resp 15   SpO2 99%   Physical Exam Vitals and nursing note reviewed.  Constitutional:      Appearance: He is well-developed.  HENT:     Head: Normocephalic and atraumatic.  Eyes:     Pupils: Pupils are equal, round, and reactive to light.  Neck:     Vascular: No JVD.  Cardiovascular:     Rate and Rhythm: Normal rate and regular rhythm.     Heart sounds: No murmur heard.    No friction rub. No gallop.  Pulmonary:     Effort: No respiratory distress.     Breath sounds: No wheezing.  Abdominal:     General: There is no distension.     Tenderness: There is no abdominal tenderness. There is no guarding or rebound.  Musculoskeletal:        General: Normal range of motion.     Cervical back: Normal range of motion and neck supple.     Comments: No obvious tenderness about the clavicle the [facility] joint or the scapula.  No obvious rotator  cuff weakness.  No pain at the lateral epicondyle.  Some mild pain about the wrist extensor musculature.  Pulse motor and sensation intact.  More pain and tenderness about the left trapezius muscle belly.  Reproduces his symptoms.  Skin:    Coloration: Skin is not pale.     Findings: No rash.  Neurological:     Mental Status: He is alert and oriented to person, place, and time.  Psychiatric:        Behavior: Behavior normal.     (all labs ordered are listed, but only abnormal results are displayed) Labs Reviewed - No data to display  EKG: None  Radiology: No results found.   Procedures   Medications Ordered in the ED  ketorolac  (TORADOL ) 15 MG/ML injection 15 mg (has no administration in time range)                                    Medical Decision Making Risk Prescription drug management.   63 yo M with a chief complaints of left shoulder pain.  This has been  going on for couple days.  Likely muscular strain by history and physical exam.  Sling for comfort.  PCP follow-up.  1:06 PM:  I have discussed the diagnosis/risks/treatment options with the patient.  Evaluation and diagnostic testing in the emergency department does not suggest an emergent condition requiring admission or immediate intervention beyond what has been performed at this time.  They will follow up with PCP. We also discussed returning to the ED immediately if new or worsening sx occur. We discussed the sx which are most concerning (e.g., sudden worsening pain, fever, inability to tolerate by mouth) that necessitate immediate return. Medications administered to the patient during their visit and any new prescriptions provided to the patient are listed below.  Medications given during this visit Medications  ketorolac  (TORADOL ) 15 MG/ML injection 15 mg (has no administration in time range)     The patient appears reasonably screen and/or stabilized for discharge and I doubt any other medical condition or other [facility] requiring further screening, evaluation, or treatment in the ED at this time prior to discharge.       Final diagnoses:  Trapezius strain, left, initial encounter    ED Discharge Orders          Ordered    diclofenac  Sodium (VOLTAREN ) 1 % GEL  4 times daily        08/06/24 1237               Emil Share, DO 08/06/24 1306

## 2024-08-07 ENCOUNTER — Other Ambulatory Visit: Payer: Self-pay | Admitting: Family Medicine

## 2024-08-20 NOTE — Progress Notes (Unsigned)
 Subjective:     [name], male    DOB: 23-Sep-1961, 63 y.o.   MRN: [id number]  No chief complaint on file.   HPI  Discussed the use of AI scribe software for clinical note transcription with the patient, who gave verbal consent to proceed.  History of Present Illness Tommy Farrell is a 63 year old male who presents with left arm pain between the elbow and wrist.  He has experienced intermittent pain in his left arm between the elbow and wrist for three months. He is left handed. The pain varies in intensity and is not constant. It sometimes  eases with certain movements. The pain subsides after relaxing, such as after a shower or when not lying on his right side.  He has tried Voltaren  cream without relief. Stretching the arm provides temporary alleviation.  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 He engages in physical activities like weightlifting with up to twenty pounds and performing pushups. Denies muscle weakness.   Patient denies fever, chills, SOB, CP, palpitations, dyspnea, edema, HA, vision changes, N/V/D, abdominal pain, urinary symptoms, rash, weight changes, and recent illness or hospitalizations.     History of Present Illness              Health Maintenance Due  Topic Date Due   Pneumococcal Vaccine: 50+ Years (1 of 1 - PCV) Never done   Influenza Vaccine  Never done   COVID-19 Vaccine (5 - 2025-26 season) 07/27/2024    Past Medical History:  Diagnosis Date   Allergy    Constipation 12/25/2015   Dysrhythmia    sinus arrythmia-2006-work up negative findings   Hyperglycemia 03/15/2015   Hyperlipemia    Hypertension    Leg pain, diffuse, right 04/15/2017   Preventative health care 03/20/2015   Sees Dr Kristie of gastroenterology    Past Surgical History:  Procedure Laterality Date   KNEE ARTHROSCOPY  2009   rt knee   KNEE ARTHROSCOPY  01/16/2012   Procedure:  ARTHROSCOPY KNEE;  Surgeon: Dempsey LULLA Moan, MD;  Location: [facility];  Service: Orthopedics;  Laterality: Left;  WITH DEBRIDEMENT   VENTRAL HERNIA REPAIR      Family History  Problem Relation Age of Onset   Hypertension Mother    Diabetes Mother        labile   Kidney disease Mother        ESRD   Peripheral vascular disease Mother    Heart disease Father        MI?   Learning disabilities Daughter    Drug abuse Son    Diabetes Maternal Aunt    Heart disease Maternal Aunt    Heart disease Cousin    Diabetes Cousin    Alcohol abuse Cousin     Social History   Socioeconomic History   Marital status: Married    Spouse name: Delora   Number of children: Not on file   Years of education: 12   Highest education level: 12th grade  Occupational History   Occupation: Naval architect for [employer]  Tobacco Use   Smoking status: Never   Smokeless tobacco: Never  Vaping Use   Vaping status: Never Used  Substance and Sexual Activity   Alcohol use: No   Drug use: No   Sexual activity: Yes    Comment: lives with wife, no major dietary restrictions, works for [employer]  Other Topics Concern  Not on file  Social History Narrative   Not on file   Social Drivers of Health   Financial Resource Strain: Medium Risk (07/16/2024)   Overall Financial Resource Strain (CARDIA)    Difficulty of Paying Living Expenses: Somewhat hard  Food Insecurity: No Food Insecurity (07/16/2024)   Hunger Vital Sign    Worried About Running Out of Food in the Last Year: Never true    Ran Out of Food in the Last Year: Never true  Transportation Needs: No Transportation Needs (07/16/2024)   PRAPARE - Administrator, Civil Service (Medical): No    Lack of Transportation (Non-Medical): No  Physical Activity: Sufficiently Active (07/16/2024)   Exercise Vital Sign    Days of Exercise per Week: 5 days    Minutes of Exercise per Session: 30 min  Stress: No Stress Concern Present  (07/16/2024)   [employer] of Occupational Health - Occupational Stress Questionnaire    Feeling of Stress: Not at all  Social Connections: Socially Integrated (07/16/2024)   Social Connection and Isolation Panel    Frequency of Communication with Friends and Family: More than three times a week    Frequency of Social Gatherings with Friends and Family: Once a week    Attends Religious Services: More than 4 times per year    Active Member of [employer] or Organizations: Yes    Attends Banker Meetings: More than 4 times per year    Marital Status: Married  Catering manager Violence: Unknown (02/26/2022)   Received from [facility]   HITS    Physically Hurt: Not on file    Insult or Talk Down To: Not on file    Threaten Physical Harm: Not on file    Scream or Curse: Not on file    Outpatient Medications Prior to Visit  Medication Sig Dispense Refill   ASPIRIN 81 PO Take by mouth daily.     azelastine  (ASTELIN ) 0.1 % nasal spray Place 1 spray into both nostrils 2 (two) times daily. Use in each nostril as directed 30 mL 0   benzonatate  (TESSALON ) 100 MG capsule Take 1 capsule (100 mg total) by mouth 2 (two) times daily as needed for cough. 20 capsule 0   Cholecalciferol (D3 PO) Take by mouth daily.     diclofenac  Sodium (VOLTAREN ) 1 % GEL Apply 4 g topically 4 (four) times daily. 100 g 0   fish oil-omega-3 fatty acids 1000 MG capsule Take 2 g by mouth daily.     gabapentin  (NEURONTIN ) 100 MG capsule Take 1-3 capsules (100-300 mg total) by mouth at bedtime. 30 capsule 3   hydrochlorothiazide  (HYDRODIURIL ) 25 MG tablet Take 1 tablet (25 mg total) by mouth daily. 90 tablet 2   lisinopril  (ZESTRIL ) 10 MG tablet Take 1 tablet (10 mg total) by mouth daily. 90 tablet 1   Multiple Vitamin (MULTIVITAMIN) tablet Take 1 tablet by mouth daily.     Red Yeast Rice 600 MG CAPS Take 1 tablet by mouth daily.     ZINC CITRATE PO Take by mouth daily.     COD LIVER OIL PO Take by mouth daily.      Garlic 10 MG CAPS Take by mouth.     No facility-administered medications prior to visit.    No Known Allergies  ROS See HPI    Objective:    Physical Exam General: No acute distress. Awake and conversant.  Eyes: Normal conjunctiva, anicteric. Round symmetric pupils.  Respiratory: CTAB. Respirations  are non-labored. No wheezing.  Skin: Warm. No rashes or ulcers.  Psych: Alert and oriented. Cooperative, Appropriate mood and affect, Normal judgment.  CV: RRR. No murmur. No lower extremity edema.  MSK: Normal ambulation. No clubbing or cyanosis.  Neuro:  CN II-XII grossly normal.    BP 117/74   Pulse (!) 58   Ht 5' 6 (1.676 m)   Wt 162 lb 3.2 oz (73.6 kg)   SpO2 97%   BMI 26.18 kg/m  Wt Readings from Last 3 Encounters:  08/21/24 162 lb 3.2 oz (73.6 kg)  07/23/24 165 lb 9.6 oz (75.1 kg)  02/10/24 164 lb (74.4 kg)       Assessment & Plan:   Problem List Items Addressed This Visit   None Visit Diagnoses       Pain of left upper extremity    -  Primary   Relevant Medications   meloxicam  (MOBIC ) 7.5 MG tablet      Assessment and Plan Left forearm muscle strain with associated pain Intermittent pain between left elbow and wrist for three months, likely due to repetitive use and weight lifting.  - Prescribed meloxicam  for pain and inflammation, to be taken as needed, and not with ibuprofen . May use Tylenol  prn.  - Advised rest and avoidance of weight lifting and pushups. - Recommended light stretching before workouts.   I have discontinued Caydn Clair Reggie's Garlic and COD LIVER OIL PO. I am also having him start on meloxicam . Additionally, I am having him maintain his multivitamin, fish oil-omega-3 fatty acids, Red Yeast Rice, ZINC CITRATE PO, Cholecalciferol (D3 PO), ASPIRIN 81 PO, benzonatate , azelastine , gabapentin , hydrochlorothiazide , diclofenac  Sodium, and lisinopril .  Meds ordered this encounter  Medications   meloxicam  (MOBIC ) 7.5 MG tablet     Sig: Take 1 tablet (7.5 mg total) by mouth daily as needed for pain.    Dispense:  15 tablet    Refill:  0    Supervising Provider:   DOMENICA BLACKBIRD A [[id number]]

## 2024-08-21 ENCOUNTER — Encounter: Payer: Self-pay | Admitting: Student

## 2024-08-21 ENCOUNTER — Ambulatory Visit (INDEPENDENT_AMBULATORY_CARE_PROVIDER_SITE_OTHER): Admitting: Student

## 2024-08-21 ENCOUNTER — Ambulatory Visit: Admitting: Family Medicine

## 2024-08-21 VITALS — BP 117/74 | HR 58 | Ht 66.0 in | Wt 162.2 lb

## 2024-08-21 DIAGNOSIS — M79602 Pain in left arm: Secondary | ICD-10-CM | POA: Diagnosis not present

## 2024-08-21 MED ORDER — MELOXICAM 7.5 MG PO TABS: 7.5000 mg | ORAL_TABLET | Freq: Every day | ORAL | 0 refills | Status: AC | PRN

## 2024-09-14 ENCOUNTER — Ambulatory Visit: Admitting: Family Medicine

## 2024-09-14 ENCOUNTER — Ambulatory Visit: Payer: Self-pay

## 2024-09-14 VITALS — BP 118/78 | HR 66 | Temp 98.0°F | Resp 16 | Ht 66.0 in | Wt 165.0 lb

## 2024-09-14 DIAGNOSIS — S29012A Strain of muscle and tendon of back wall of thorax, initial encounter: Secondary | ICD-10-CM | POA: Diagnosis not present

## 2024-09-14 DIAGNOSIS — S46812A Strain of other muscles, fascia and tendons at shoulder and upper arm level, left arm, initial encounter: Secondary | ICD-10-CM

## 2024-09-14 MED ORDER — TIZANIDINE HCL 4 MG PO TABS: 4.0000 mg | ORAL_TABLET | Freq: Four times a day (QID) | ORAL | 0 refills | Status: AC | PRN

## 2024-09-14 NOTE — Progress Notes (Signed)
 Musculoskeletal Exam  Patient: Tommy Farrell DOB: March 03, 1961  DOS: 09/14/2024  SUBJECTIVE:  Chief Complaint:   Chief Complaint  Patient presents with   Arm Pain    Left Arm Pain    Tommy Farrell is a 63 y.o.  male for evaluation and treatment of L shoulder pain.   Onset:  2 months ago. No inj or change in activity.  Location: L trap area Character:  sharp  Progression of issue:  is unchanged Associated symptoms: radiates down to L forearm Treatment: to date has been ice, OTC NSAIDS, prescription NSAIDS, and wearing a sling.   Neurovascular symptoms: no  Past Medical History:  Diagnosis Date   Allergy    Constipation 12/25/2015   Dysrhythmia    sinus arrythmia-2006-work up negative findings   Hyperglycemia 03/15/2015   Hyperlipemia    Hypertension    Leg pain, diffuse, right 04/15/2017   Preventative health care 03/20/2015   Sees Dr Kristie of gastroenterology    Objective: VITAL SIGNS: BP 118/78 (BP Location: Left Arm, Patient Position: Sitting)   Pulse 66   Temp 98 F (36.7 C) (Oral)   Resp 16   Ht 5' 6 (1.676 m)   Wt 165 lb (74.8 kg)   SpO2 98%   BMI 26.63 kg/m  Constitutional: Well formed, well developed. No acute distress. Thorax & Lungs: No accessory muscle use Musculoskeletal: L shoulder.   Normal active range of motion: yes.   Normal passive range of motion: yes Tenderness to palpation: Mild TTP and hypertonicity over the trapezius and rhomboid musculature at T3 on the left Deformity: no Ecchymosis: no Tests positive: None Tests negative: Spurling's, Neer's, crossover, O'Brien's, speeds, empty can Neurologic: Normal sensory function. No focal deficits noted. DTR's equal and symmetric in LE's. No clonus. Psychiatric: Normal mood. Age appropriate judgment and insight. Alert & oriented x 3.    Assessment:  Strain of left trapezius muscle, initial encounter  Strain of rhomboid muscle, initial encounter  Plan: Stretches/exercises, heat, ice,  Tylenol , muscle relaxer.  Warnings about potential drowsiness with Zanaflex  verbalized and written down.  Consider physical therapy if no improvement over the next month. F/u as originally scheduled with regular PCP or as needed with me. The patient voiced understanding and agreement to the plan.   Mabel [place], DO 09/14/24  5:14 PM

## 2024-09-14 NOTE — Telephone Encounter (Signed)
 Appt scheduled

## 2024-09-14 NOTE — Patient Instructions (Signed)
 Heat (pad or rice pillow in microwave) over affected area, 10-15 minutes twice daily.   Ice/cold pack over area for 10-15 min twice daily.  OK to take Tylenol  1000 mg (2 extra strength tabs) or 975 mg (3 regular strength tabs) every 6 hours as needed.  Take Zanaflex  (tizanidine ) 1-2 hours before planned bedtime. If it makes you drowsy, do not take during the day. You can try half a tab the following night.  Let us  know if you need anything.  Mid-Back Strain Rehab It is normal to feel mild stretching, pulling, tightness, or discomfort as you do these exercises, but you should stop right away if you feel sudden pain or your pain gets worse.  Stretching and range of motion exercises This exercise warms up your muscles and joints and improves the movement and flexibility of your back and shoulders. This exercise also help to relieve pain. Exercise A: Chest and spine stretch  Lie down on your back on a firm surface. Roll a towel or a small blanket so it is about 4 inches (10 cm) in diameter. Put the towel lengthwise under the middle of your back so it is under your spine, but not under your shoulder blades. To increase the stretch, you may put your hands behind your head and let your elbows fall to your sides. Hold for 30 seconds. Repeat exercise 2 times. Complete this exercise 3 times per week. Strengthening exercises These exercises build strength and endurance in your back and your shoulder blade muscles. Endurance is the ability to use your muscles for a long time, even after they get tired. Exercise C: Straight arm rows (shoulder extension) Stand with your feet shoulder width apart. Secure an exercise band to a stable object in front of you so the band is at or above shoulder height. Hold one end of the exercise band in each hand. Straighten your elbows and lift your hands up to shoulder height. Step back, away from the secured end of the exercise band, until the band stretches. Squeeze  your shoulder blades together and pull your hands down to the sides of your thighs. Stop when your hands are straight down by your sides. Do not let your hands go behind your body. Hold for 2 seconds. Slowly return to the starting position. Repeat 2 times. Complete this exercise 3 times per week. Exercise D: Shoulder external rotation, prone Lie on your abdomen on a firm bed so your left / right forearm hangs over the edge of the bed and your upper arm is on the bed, straight out from your body. Your elbow should be bent. Your palm should be facing your feet. If instructed, hold a 2-5 lb weight in your hand. Squeeze your shoulder blade toward the middle of your back. Do not let your shoulder lift toward your ear. Keep your elbow bent in an L shape (90 degrees) while you slowly move your forearm up toward the ceiling. Move your forearm up to the height of the bed, toward your head. Your upper arm should not move. At the top of the movement, your palm should face the floor. Hold for 1 second. Slowly return to the starting position and relax your muscles. Repeat 2 times. Complete this exercise 3 times per week. Exercise E: Scapular retraction and external rotation, rowing  Sit in a stable chair without armrests, or stand. Secure an exercise band to a stable object in front of you so it is at shoulder height. Hold one end of  the exercise band in each hand. Bring your arms out straight in front of you. Step back, away from the secured end of the exercise band, until the band stretches. Pull the band backward. As you do this, bend your elbows and squeeze your shoulder blades together, but avoid letting the rest of your body move. Do not let your shoulders lift up toward your ears. Stop when your elbows are at your sides or slightly behind your body. Hold for 1 second1. Slowly straighten your arms to return to the starting position. Repeat 2 times. Complete this exercise 3 times per  week. Posture and body mechanics  Body mechanics refers to the movements and positions of your body while you do your daily activities. Posture is part of body mechanics. Good posture and healthy body mechanics can help to relieve stress in your body's tissues and joints. Good posture means that your spine is in its natural S-curve position (your spine is neutral), your shoulders are pulled back slightly, and your head is not tipped forward. The following are general guidelines for applying improved posture and body mechanics to your everyday activities. Standing  When standing, keep your spine neutral and your feet about hip-width apart. Keep a slight bend in your knees. Your ears, shoulders, and hips should line up. When you do a task in which you lean forward while standing in one place for a long time, place one foot up on a stable object that is 2-4 inches (5-10 cm) high, such as a footstool. This helps keep your spine neutral. Sitting  When sitting, keep your spine neutral and keep your feet flat on the floor. Use a footrest, if necessary, and keep your thighs parallel to the floor. Avoid rounding your shoulders, and avoid tilting your head forward. When working at a desk or a computer, keep your desk at a height where your hands are slightly lower than your elbows. Slide your chair under your desk so you are close enough to maintain good posture. When working at a computer, place your monitor at a height where you are looking straight ahead and you do not have to tilt your head forward or downward to look at the screen. Resting  When lying down and resting, avoid positions that are most painful for you. If you have pain with activities such as sitting, bending, stooping, or squatting (flexion-based activities), lie in a position in which your body does not bend very much. For example, avoid curling up on your side with your arms and knees near your chest (fetal position). If you have pain  with activities such as standing for a long time or reaching with your arms (extension-based activities), lie with your spine in a neutral position and bend your knees slightly. Try the following positions: Lying on your side with a pillow between your knees. Lying on your back with a pillow under your knees.  Lifting  When lifting objects, keep your feet at least shoulder-width apart and tighten your abdominal muscles. Bend your knees and hips and keep your spine neutral. It is important to lift using the strength of your legs, not your back. Do not lock your knees straight out. Always ask for help to lift heavy or awkward objects. Make sure you discuss any questions you have with your health care provider. Document Released: 11/12/2005 Document Revised: 07/19/2016 Document Reviewed: 08/24/2015 Elsevier Interactive Patient Education  2018 [employer]  Trapezius stretches/exercises Do exercises exactly as told by your health care provider and  adjust them as directed. It is normal to feel mild stretching, pulling, tightness, or discomfort as you do these exercises, but you should stop right away if you feel sudden pain or your pain gets worse.   Stretching and range of motion exercises These exercises warm up your muscles and joints and improve the movement and flexibility of your shoulder. These exercises can also help to relieve pain, numbness, and tingling. If you are unable to do any of the following for any reason, do not further attempt to do it.   Exercise A: Flexion, standing     Stand and hold a broomstick, a cane, or a similar object. Place your hands a little more than shoulder-width apart on the object. Your left / right hand should be palm-up, and your other hand should be palm-down. Push the stick to raise your left / right arm out to your side and then over your head. Use your other hand to help move the stick. Stop when you feel a stretch in your shoulder, or when you reach  the angle that is recommended by your health care provider. Avoid shrugging your shoulder while you raise your arm. Keep your shoulder blade tucked down toward your spine. Hold for 30 seconds. Slowly return to the starting position. Repeat 2 times. Complete this exercise 3 times per week.  Exercise B: Abduction, supine     Lie on your back and hold a broomstick, a cane, or a similar object. Place your hands a little more than shoulder-width apart on the object. Your left / right hand should be palm-up, and your other hand should be palm-down. Push the stick to raise your left / right arm out to your side and then over your head. Use your other hand to help move the stick. Stop when you feel a stretch in your shoulder, or when you reach the angle that is recommended by your health care provider. Avoid shrugging your shoulder while you raise your arm. Keep your shoulder blade tucked down toward your spine. Hold for 30 seconds. Slowly return to the starting position. Repeat 2 times. Complete this exercise 3 times per week.  Exercise C: Flexion, active-assisted     Lie on your back. You may bend your knees for comfort. Hold a broomstick, a cane, or a similar object. Place your hands about shoulder-width apart on the object. Your palms should face toward your feet. Raise the stick and move your arms over your head and behind your head, toward the floor. Use your healthy arm to help your left / right arm move farther. Stop when you feel a gentle stretch in your shoulder, or when you reach the angle where your health care provider tells you to stop. Hold for 30 seconds. Slowly return to the starting position. Repeat 2 times. Complete this exercise 3 times per week.  Exercise D: External rotation and abduction     Stand in a door frame with one of your feet slightly in front of the other. This is called a staggered stance. Choose one of the following positions as told by your health care  provider: Place your hands and forearms on the door frame above your head. Place your hands and forearms on the door frame at the height of your head. Place your hands on the door frame at the height of your elbows. Slowly move your weight onto your front foot until you feel a stretch across your chest and in the front of your shoulders. Keep  your head and chest upright and keep your abdominal muscles tight. Hold for 30 seconds. To release the stretch, shift your weight to your back foot. Repeat 2 times. Complete this stretch 3 times per week.  Strengthening exercises These exercises build strength and endurance in your shoulder. Endurance is the ability to use your muscles for a long time, even after your muscles get tired. Exercise E: Scapular depression and adduction  Sit on a stable chair. Support your arms in front of you with pillows, armrests, or a tabletop. Keep your elbows in line with the sides of your body. Gently move your shoulder blades down toward your middle back. Relax the muscles on the tops of your shoulders and in the back of your neck. Hold for 3 seconds. Slowly release the tension and relax your muscles completely before doing this exercise again. Repeat for a total of 10 repetitions. After you have practiced this exercise, try doing the exercise without the arm support. Then, try the exercise while standing instead of sitting. Repeat 2 times. Complete this exercise 3 times per week.  Exercise F: Shoulder abduction, isometric     Stand or sit about 4-6 inches (10-15 cm) from a wall with your left / right side facing the wall. Bend your left / right elbow and gently press your elbow against the wall. Increase the pressure slowly until you are pressing as hard as you can without shrugging your shoulder. Hold for 3 seconds. Slowly release the tension and relax your muscles completely. Repeat for a total of 10 repetitions. Repeat 2 times. Complete this exercise 3 times  per week.  Exercise G: Shoulder flexion, isometric     Stand or sit about 4-6 inches (10-15 cm) away from a wall with your left / right side facing the wall. Keep your left / right elbow straight and gently press the top of your fist against the wall. Increase the pressure slowly until you are pressing as hard as you can without shrugging your shoulder. Hold for 10-15 seconds. Slowly release the tension and relax your muscles completely. Repeat for a total of 10 repetitions. Repeat 2 times. Complete this exercise 3 times per week.  Exercise H: Internal rotation     Sit in a stable chair without armrests, or stand. Secure an exercise band at your left / right side, at elbow height. Place a soft object, such as a folded towel or a small pillow, under your left / right upper arm so your elbow is a few inches (about 8 cm) away from your side. Hold the end of the exercise band so the band stretches. Keeping your elbow pressed against the soft object under your arm, move your forearm across your body toward your abdomen. Keep your body steady so the movement is only coming from your shoulder. Hold for 3 seconds. Slowly return to the starting position. Repeat for a total of 10 repetitions. Repeat 2 times. Complete this exercise 3 times per week.  Exercise I: External rotation     Sit in a stable chair without armrests, or stand. Secure an exercise band at your left / right side, at elbow height. Place a soft object, such as a folded towel or a small pillow, under your left / right upper arm so your elbow is a few inches (about 8 cm) away from your side. Hold the end of the exercise band so the band stretches. Keeping your elbow pressed against the soft object under your arm, move your forearm  out, away from your abdomen. Keep your body steady so the movement is only coming from your shoulder. Hold for 3 seconds. Slowly return to the starting position. Repeat for a total of 10  repetitions. Repeat 2 times. Complete this exercise 3 times per week. Exercise J: Shoulder extension  Sit in a stable chair without armrests, or stand. Secure an exercise band to a stable object in front of you so the band is at shoulder height. Hold one end of the exercise band in each hand. Your palms should face each other. Straighten your elbows and lift your hands up to shoulder height. Step back, away from the secured end of the exercise band, until the band stretches. Squeeze your shoulder blades together and pull your hands down to the sides of your thighs. Stop when your hands are straight down by your sides. Do not let your hands go behind your body. Hold for 3 seconds. Slowly return to the starting position. Repeat for a total of 10 repetitions. Repeat 2 times. Complete this exercise 3 times per week.  Exercise K: Shoulder extension, prone     Lie on your abdomen on a firm surface so your left / right arm hangs over the edge. Hold a 5 lb weight in your hand so your palm faces in toward your body. Your arm should be straight. Squeeze your shoulder blade down toward the middle of your back. Slowly raise your arm behind you, up to the height of the surface that you are lying on. Keep your arm straight. Hold for 3 seconds. Slowly return to the starting position and relax your muscles. Repeat for a total of 10 repetitions. Repeat 2 times. Complete this exercise 3 times per week.   Exercise L: Horizontal abduction, prone  Lie on your abdomen on a firm surface so your left / right arm hangs over the edge. Hold a 5 lb weight in your hand so your palm faces toward your feet. Your arm should be straight. Squeeze your shoulder blade down toward the middle of your back. Bend your elbow so your hand moves up, until your elbow is bent to an L shape (90 degrees). With your elbow bent, slowly move your forearm forward and up. Raise your hand up to the height of the surface that you are lying  on. Your upper arm should not move, and your elbow should stay bent. At the top of the movement, your palm should face the floor. Hold for 3 seconds. Slowly return to the starting position and relax your muscles. Repeat for a total of 10 repetitions. Repeat 2 times. Complete this exercise 3 times per week.  Exercise M: Horizontal abduction, standing  Sit on a stable chair, or stand. Secure an exercise band to a stable object in front of you so the band is at shoulder height. Hold one end of the exercise band in each hand. Straighten your elbows and lift your hands straight in front of you, up to shoulder height. Your palms should face down, toward the floor. Step back, away from the secured end of the exercise band, until the band stretches. Move your arms out to your sides, and keep your arms straight. Hold for 3 seconds. Slowly return to the starting position. Repeat for a total of 10 repetitions. Repeat 2 times. Complete this exercise 3 times per week.  Exercise N: Scapular retraction and elevation  Sit on a stable chair, or stand. Secure an exercise band to a stable object in front  of you so the band is at shoulder height. Hold one end of the exercise band in each hand. Your palms should face each other. Sit in a stable chair without armrests, or stand. Step back, away from the secured end of the exercise band, until the band stretches. Squeeze your shoulder blades together and lift your hands over your head. Keep your elbows straight. Hold for 3 seconds. Slowly return to the starting position. Repeat for a total of 10 repetitions. Repeat 2 times. Complete this exercise 3 times per week.  This information is not intended to replace advice given to you by your health care provider. Make sure you discuss any questions you have with your health care provider. Document Released: 11/12/2005 Document Revised: 07/19/2016 Document Reviewed: 09/29/2015 Elsevier Interactive Patient Education   2017 [employer].

## 2024-09-14 NOTE — Telephone Encounter (Signed)
 FYI Only or Action Required?: FYI only for provider.  Patient was last seen in primary care on 08/21/2024 by Wheeler Harlene CROME, NP.  Called Nurse Triage reporting Arm Pain.  Symptoms began about a month ago.  Interventions attempted: Prescription medications: meloxicam .  Symptoms are: unchanged.  Triage Disposition: See Physician Within 24 Hours  Patient/caregiver understands and will follow disposition?: Yes  Copied from CRM #[id number]. Topic: Clinical - Red Word Triage >> Sep 14, 2024  8:49 AM Ivette P wrote: Red Word that prompted transfer to Nurse Triage:  referral for the left side shoulder blade pain, travelling down to wrist and elbow. Tingling in hand and arm from time to time. Reason for Disposition  Numbness (i.e., loss of sensation) in hand or fingers  Answer Assessment - Initial Assessment Questions Pt was seen by PCP 1-2 weeks ago and rx meloxicam . States meds did not help. Went to ER, received an injection. Left side, radiates to between wrist and elbow. Tingling in his hand from time to time for the last two months. States at the worst 8/10. Denies any other sxs at this time.   1. ONSET: When did the pain start?     Two months 2. LOCATION: Where is the pain located?     Left side shoulder 3. PAIN: How bad is the pain? (Scale 1-10; or mild, moderate, severe)     8/10 at the worst 4. WORK OR EXERCISE: Has there been any recent work or exercise that involved this part of the body?     denies 5. CAUSE: What do you think is causing the shoulder pain?     Denies; but ER states it could be pulled muscle 6. OTHER SYMPTOMS: Do you have any other symptoms? (e.g., neck pain, swelling, rash, fever, numbness, weakness)     Denies; admits to occasional tingling in fingers on that side  Protocols used: Shoulder Pain-A-AH

## 2024-09-28 ENCOUNTER — Telehealth: Payer: Self-pay | Admitting: Family Medicine

## 2024-09-28 NOTE — Telephone Encounter (Signed)
 Copied from CRM [id number]. Topic: Referral - Request for Referral >> Sep 28, 2024 10:44 AM Eva FALCON wrote: Did the patient discuss referral with their provider in the last year? Yes (If No - schedule appointment) (If Yes - send message)  Appointment offered? No, states he just saw provider   Type of order/referral and detailed reason for visit: Neurologist, having pain in back shoulder.   Preference of office, provider, location: He states he spoke to his insurance.  He is willing to go to [facility] on [address] [place], [place] [address] or [facility] on [address], [place], [place] [address] which ever is available sooner.   If referral order, have you been seen by this specialty before? No (If Yes, this issue or another issue? When? Where?  Can we respond through MyChart? Yes

## 2024-09-29 NOTE — Telephone Encounter (Signed)
 Patient was advised and reports he will try the sport medicine doctor first and then if it does not work he would like the referral placed for neurology.

## 2024-09-30 ENCOUNTER — Other Ambulatory Visit: Payer: Self-pay | Admitting: Family Medicine

## 2024-09-30 DIAGNOSIS — M25519 Pain in unspecified shoulder: Secondary | ICD-10-CM

## 2024-10-02 NOTE — Telephone Encounter (Signed)
 Patient is aware someone will reach out to him for appointment.

## 2024-10-15 NOTE — Progress Notes (Signed)
 Tommy Farrell D.CLEMENTEEN [facility] [address] [place] [address] Phone: [phone]   Assessment and Plan:     1. Chronic left shoulder pain (Primary) 2. Neck pain 3. DDD (degenerative disc disease), cervical 4. Numbness and tingling of left upper extremity -Chronic with exacerbation, subsequent visit - Recurrence of left arm numbness, tingling and pain radiating from left side of neck into left shoulder.  Most consistent with cervical DDD leading to cervical radiculopathy.  Patient's shoulder physical exam and x-ray are unremarkable, so cervical etiology is much more likely - Recommend C-spine MRI due to recurrence that has not improved despite >6 weeks of conservative therapy, pain with day-to-day activities, pain >6/10, DDD on x-ray - Start Celebrex  200 mg twice daily x2 weeks.  If still having pain after 2 weeks, complete 3rd-week of NSAID. May use remaining NSAID as needed once daily for pain control.  Do not to use additional over-the-counter NSAIDs (ibuprofen , naproxen , Advil , Aleve , etc.) while taking prescription NSAIDs.  May use Tylenol  [phone] mg 2 to 3 times a day for breakthrough pain.  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  This included exercises focusing on stretching, strengthening, with focus on eccentric aspects.   Long term goals include an improvement in range of motion, strength, endurance as well as avoiding reinjury. Patient's frequency would include in 1-2 times a day, 3-5 times a week for a duration of 6-12 weeks. Proper technique shown and discussed handout in great detail with ATC.  All questions were discussed and answered.    Pertinent previous records reviewed include none   Follow Up: 4 weeks for reevaluation.  Would review cervical spine MRI and could consider epidural if  appropriate based on imaging   Subjective:   I, Tommy Farrell, am serving as a neurosurgeon for Doctor Morene Farrell  Chief Complaint: shoulder pain   HPI:   10/16/2024 Patient is a 63 year old male with shoulder pain. Patient states 3 months ago pain started. Pain radiates down his arm to his wrist.meloxicam  did not help. Muscle relaxer did not help. Does endorse tingling, that started a month ago. Grip strength intact. ROM WNL. When he flexed he has some relief but when he brings the arm down he has numbness and tingling.   Relevant Historical Information: Hypertension  Additional pertinent review of systems negative.   Current Outpatient Medications:    celecoxib  (CELEBREX ) 200 MG capsule, Take 1 capsule (200 mg total) by mouth 2 (two) times daily., Disp: 60 capsule, Rfl: 0   ASPIRIN 81 PO, Take by mouth daily., Disp: , Rfl:    Cholecalciferol (D3 PO), Take by mouth daily., Disp: , Rfl:    fish oil-omega-3 fatty acids 1000 MG capsule, Take 2 g by mouth daily., Disp: , Rfl:    hydrochlorothiazide  (HYDRODIURIL ) 25 MG tablet, Take 1 tablet (25 mg total) by mouth daily., Disp: 90 tablet, Rfl: 2   lisinopril  (ZESTRIL ) 10 MG tablet, Take 1 tablet (10 mg total) by mouth daily., Disp: 90 tablet, Rfl: 1   meloxicam  (MOBIC ) 7.5 MG tablet, Take 1 tablet (7.5 mg total) by mouth daily as needed for pain., Disp: 15 tablet, Rfl: 0   Multiple Vitamin (MULTIVITAMIN) tablet, Take 1 tablet by mouth daily., Disp: , Rfl:    Red Yeast Rice 600 MG CAPS, Take 1 tablet by mouth  daily., Disp: , Rfl:    tiZANidine  (ZANAFLEX ) 4 MG tablet, Take 1 tablet (4 mg total) by mouth every 6 (six) hours as needed for muscle spasms., Disp: 21 tablet, Rfl: 0   ZINC CITRATE PO, Take by mouth daily., Disp: , Rfl:    Objective:     Vitals:   10/16/24 0803  BP: 132/82  Pulse: 74  SpO2: 96%  Weight: 165 lb (74.8 kg)  Height: 5' 6 (1.676 m)      Body mass index is 26.63 kg/m.    Physical Exam:    Gen: Appears  well, nad, nontoxic and pleasant Neuro:sensation intact, strength is 5/5, muscle tone wnl Skin: no suspicious lesion or defmority Psych: A&O, appropriate mood and affect  Left shoulder:  No deformity, swelling or muscle wasting No scapular winging FF 180, abd 180, int 0, ext 90 NTTP over the [place], clavicle, ac, coracoid, biceps groove, humerus, deltoid, trapezius, cervical spine Neg neer, hawkins, empty can, obriens, crossarm, subscap liftoff, speeds Neg ant drawer, sulcus sign, apprehension Negative Spurling's test bilat FROM of neck   Neck Exam: Cervical Spine- Posture normal Skin- normal, intact  Neuro:  Strength-  Right Left   Deltoid (C5) 5/5 5/5  Bicep/Brachioradialis (C5/6) 5/5  5/5  Wrist Extension (C6) 5/5 5/5  Tricep (C7) 5/5 5/5  Wrist Flexion (C7) 5/5 5/5  Grip (C8) 5/5 5/5  Finger Abduction (T1) 5/5 5/5   Sensation: intact to light touch in upper extremities bilaterally  Spurling's:  negative bilaterally Neck ROM: Full active ROM TTP: Left trapezius NTTP: cervical spinous processes, cervical paraspinal, thoracic paraspinal, right trapezius   Electronically signed by:  Tommy Farrell D.CLEMENTEEN [facility] 8:32 AM 10/16/24

## 2024-10-16 ENCOUNTER — Ambulatory Visit

## 2024-10-16 ENCOUNTER — Ambulatory Visit (INDEPENDENT_AMBULATORY_CARE_PROVIDER_SITE_OTHER): Admitting: Sports Medicine

## 2024-10-16 VITALS — BP 132/82 | HR 74 | Ht 66.0 in | Wt 165.0 lb

## 2024-10-16 DIAGNOSIS — R200 Anesthesia of skin: Secondary | ICD-10-CM

## 2024-10-16 DIAGNOSIS — G8929 Other chronic pain: Secondary | ICD-10-CM

## 2024-10-16 DIAGNOSIS — M542 Cervicalgia: Secondary | ICD-10-CM

## 2024-10-16 DIAGNOSIS — M5030 Other cervical disc degeneration, unspecified cervical region: Secondary | ICD-10-CM

## 2024-10-16 DIAGNOSIS — R202 Paresthesia of skin: Secondary | ICD-10-CM

## 2024-10-16 DIAGNOSIS — M25512 Pain in left shoulder: Secondary | ICD-10-CM

## 2024-10-16 MED ORDER — CELECOXIB 200 MG PO CAPS: 200.0000 mg | ORAL_CAPSULE | Freq: Two times a day (BID) | ORAL | 0 refills | Status: AC

## 2024-10-16 NOTE — Patient Instructions (Signed)
-   Start Celebrex  200 mg 2x daily .  If still having pain after 2 weeks, complete 3rd-week of NSAID. May use remaining NSAID as needed once daily for pain control.  Do not to use additional over-the-counter NSAIDs (ibuprofen , naproxen , Advil , Aleve , etc.) while taking prescription NSAIDs.  May use Tylenol  [phone] mg 2 to 3 times a day for breakthrough pain.  Neck HEP  MRI referral   4-6 week follow up

## 2024-10-21 ENCOUNTER — Ambulatory Visit: Payer: Self-pay | Admitting: Sports Medicine

## 2024-10-26 ENCOUNTER — Other Ambulatory Visit (INDEPENDENT_AMBULATORY_CARE_PROVIDER_SITE_OTHER)

## 2024-10-26 DIAGNOSIS — E785 Hyperlipidemia, unspecified: Secondary | ICD-10-CM

## 2024-10-26 DIAGNOSIS — R739 Hyperglycemia, unspecified: Secondary | ICD-10-CM | POA: Diagnosis not present

## 2024-10-26 LAB — LIPID PANEL
Cholesterol: 197 mg/dL (ref 0–200)
HDL: 55 mg/dL (ref >39.00)
LDL Cholesterol: 127 mg/dL — ABNORMAL HIGH (ref 0–99)
NonHDL: 141.88
Total CHOL/HDL Ratio: 4
Triglycerides: 76 mg/dL (ref 0.0–149.0)
VLDL: 15.2 mg/dL (ref 0.0–40.0)

## 2024-10-26 LAB — HEMOGLOBIN A1C: Hgb A1c MFr Bld: 5.8 % (ref 4.6–6.5)

## 2024-10-27 ENCOUNTER — Ambulatory Visit: Payer: Self-pay | Admitting: Family Medicine

## 2024-11-08 ENCOUNTER — Ambulatory Visit
Admission: RE | Admit: 2024-11-08 | Discharge: 2024-11-08 | Disposition: A | Discharge status: Home / Self Care | Source: Ambulatory Visit | Attending: Sports Medicine | Admitting: Sports Medicine

## 2024-11-08 DIAGNOSIS — R200 Anesthesia of skin: Secondary | ICD-10-CM

## 2024-11-08 DIAGNOSIS — G8929 Other chronic pain: Secondary | ICD-10-CM

## 2024-11-08 DIAGNOSIS — M5030 Other cervical disc degeneration, unspecified cervical region: Secondary | ICD-10-CM

## 2024-11-08 DIAGNOSIS — M542 Cervicalgia: Secondary | ICD-10-CM

## 2024-11-11 ENCOUNTER — Other Ambulatory Visit: Payer: Self-pay | Admitting: Family

## 2024-11-11 ENCOUNTER — Other Ambulatory Visit: Payer: Self-pay | Admitting: Sports Medicine

## 2024-11-11 DIAGNOSIS — K219 Gastro-esophageal reflux disease without esophagitis: Secondary | ICD-10-CM

## 2024-11-12 NOTE — Progress Notes (Unsigned)
° °   [facility] [address] [place] [address] Phone: [phone]   Assessment and Plan:     ***    Pertinent previous records reviewed include ***   Follow Up: ***     Subjective:   I, Tommy Farrell, am serving as a neurosurgeon for Doctor [name]   Chief Complaint: shoulder pain    HPI:    10/16/2024 Patient is a 63 year old male with shoulder pain. Patient states 3 months ago pain started. Pain radiates down his arm to his wrist.meloxicam  did not help. Muscle relaxer did not help. Does endorse tingling, that started a month ago. Grip strength intact. ROM WNL. When he flexed he has some relief but when he brings the arm down he has numbness and tingling.   11/13/2024 Patient states   Relevant Historical Information: Hypertension    Additional pertinent review of systems negative.  Current Medications[1]   Objective:     There were no vitals filed for this visit.    There is no height or weight on file to calculate BMI.    Physical Exam:    ***   Electronically signed by:  [name] [name] 7:24 AM 11/12/2024    [1]  Current Outpatient Medications:    ASPIRIN 81 PO, Take by mouth daily., Disp: , Rfl:    celecoxib  (CELEBREX ) 200 MG capsule, Take 1 capsule (200 mg total) by mouth 2 (two) times daily., Disp: 60 capsule, Rfl: 0   Cholecalciferol (D3 PO), Take by mouth daily., Disp: , Rfl:    fish oil-omega-3 fatty acids 1000 MG capsule, Take 2 g by mouth daily., Disp: , Rfl:    hydrochlorothiazide  (HYDRODIURIL ) 25 MG tablet, Take 1 tablet (25 mg total) by mouth daily., Disp: 90 tablet, Rfl: 2   lisinopril  (ZESTRIL ) 10 MG tablet, Take 1 tablet (10 mg total) by mouth daily., Disp: 90 tablet, Rfl: 1   meloxicam  (MOBIC ) 7.5 MG tablet, Take 1 tablet (7.5 mg total) by mouth daily as needed for pain., Disp: 15 tablet, Rfl: 0   Multiple Vitamin (MULTIVITAMIN) tablet, Take 1  tablet by mouth daily., Disp: , Rfl:    Red Yeast Rice 600 MG CAPS, Take 1 tablet by mouth daily., Disp: , Rfl:    tiZANidine  (ZANAFLEX ) 4 MG tablet, Take 1 tablet (4 mg total) by mouth every 6 (six) hours as needed for muscle spasms., Disp: 21 tablet, Rfl: 0   ZINC CITRATE PO, Take by mouth daily., Disp: , Rfl:

## 2024-11-13 ENCOUNTER — Ambulatory Visit (INDEPENDENT_AMBULATORY_CARE_PROVIDER_SITE_OTHER): Admitting: Sports Medicine

## 2024-11-13 VITALS — HR 59 | Ht 66.0 in | Wt 162.0 lb

## 2024-11-13 DIAGNOSIS — M5030 Other cervical disc degeneration, unspecified cervical region: Secondary | ICD-10-CM

## 2024-11-13 DIAGNOSIS — R202 Paresthesia of skin: Secondary | ICD-10-CM | POA: Diagnosis not present

## 2024-11-13 DIAGNOSIS — M4802 Spinal stenosis, cervical region: Secondary | ICD-10-CM | POA: Diagnosis not present

## 2024-11-13 DIAGNOSIS — R200 Anesthesia of skin: Secondary | ICD-10-CM | POA: Diagnosis not present

## 2024-11-13 DIAGNOSIS — M542 Cervicalgia: Secondary | ICD-10-CM

## 2024-11-13 MED ORDER — METHYLPREDNISOLONE 4 MG PO TBPK: ORAL_TABLET | ORAL | 0 refills | Status: AC

## 2024-11-13 NOTE — Patient Instructions (Signed)
 Prednisone  dos pak   Call if you are interested in ordering an epidural injection for your neck ,or if you would like a neurosurgery referral   Continue HEP   As needed follow up

## 2025-03-08 ENCOUNTER — Encounter: Admitting: Family Medicine

## 2025-07-19 ENCOUNTER — Encounter: Admitting: Family Medicine
